# Patient Record
Sex: Male | Born: 1970
Health system: Southern US, Community
[De-identification: ages and names within clinical notes are randomized; demographics above are authoritative.]

## PROBLEM LIST (undated history)

## (undated) DIAGNOSIS — F419 Anxiety disorder, unspecified: Secondary | ICD-10-CM

## (undated) DIAGNOSIS — S065XAA Traumatic subdural hemorrhage with loss of consciousness status unknown, initial encounter: Secondary | ICD-10-CM

## (undated) DIAGNOSIS — K219 Gastro-esophageal reflux disease without esophagitis: Secondary | ICD-10-CM

## (undated) DIAGNOSIS — S065X9A Traumatic subdural hemorrhage with loss of consciousness of unspecified duration, initial encounter: Secondary | ICD-10-CM

## (undated) DIAGNOSIS — S0990XA Unspecified injury of head, initial encounter: Secondary | ICD-10-CM

## (undated) DIAGNOSIS — R918 Other nonspecific abnormal finding of lung field: Secondary | ICD-10-CM

## (undated) DIAGNOSIS — R519 Headache, unspecified: Secondary | ICD-10-CM

## (undated) DIAGNOSIS — J439 Emphysema, unspecified: Secondary | ICD-10-CM

## (undated) DIAGNOSIS — I10 Essential (primary) hypertension: Secondary | ICD-10-CM

## (undated) DIAGNOSIS — F329 Major depressive disorder, single episode, unspecified: Secondary | ICD-10-CM

## (undated) DIAGNOSIS — R569 Unspecified convulsions: Secondary | ICD-10-CM

## (undated) DIAGNOSIS — G4700 Insomnia, unspecified: Secondary | ICD-10-CM

## (undated) DIAGNOSIS — Z789 Other specified health status: Secondary | ICD-10-CM

## (undated) DIAGNOSIS — F32A Depression, unspecified: Secondary | ICD-10-CM

## (undated) DIAGNOSIS — F0390 Unspecified dementia without behavioral disturbance: Secondary | ICD-10-CM

## (undated) DIAGNOSIS — G8929 Other chronic pain: Secondary | ICD-10-CM

## (undated) DIAGNOSIS — IMO0001 Reserved for inherently not codable concepts without codable children: Secondary | ICD-10-CM

## (undated) HISTORY — DX: Insomnia, unspecified: G47.00

## (undated) HISTORY — DX: Unspecified convulsions: R56.9

## (undated) HISTORY — PX: CRANIOPLASTY: SUR330

## (undated) HISTORY — PX: BRAIN SURGERY: SHX531

## (undated) HISTORY — PX: CRANIOTOMY: SHX93

## (undated) HISTORY — DX: Anxiety disorder, unspecified: F41.9

## (undated) HISTORY — PX: ABDOMINAL SURGERY: SHX537

---

## 2004-02-22 ENCOUNTER — Emergency Department: Payer: Self-pay | Admitting: Emergency Medicine

## 2004-03-04 ENCOUNTER — Emergency Department: Payer: Self-pay | Admitting: Emergency Medicine

## 2005-12-16 ENCOUNTER — Emergency Department (HOSPITAL_COMMUNITY): Admission: EM | Admit: 2005-12-16 | Discharge: 2005-12-16 | Payer: Self-pay | Admitting: Emergency Medicine

## 2006-06-11 ENCOUNTER — Emergency Department (HOSPITAL_COMMUNITY): Admission: EM | Admit: 2006-06-11 | Discharge: 2006-06-11 | Payer: Self-pay | Admitting: Emergency Medicine

## 2006-06-12 ENCOUNTER — Ambulatory Visit: Payer: Self-pay | Admitting: Orthopedic Surgery

## 2007-07-13 ENCOUNTER — Emergency Department (HOSPITAL_COMMUNITY): Admission: EM | Admit: 2007-07-13 | Discharge: 2007-07-13 | Payer: Self-pay | Admitting: Emergency Medicine

## 2007-11-23 ENCOUNTER — Emergency Department (HOSPITAL_COMMUNITY): Admission: EM | Admit: 2007-11-23 | Discharge: 2007-11-23 | Payer: Self-pay | Admitting: Emergency Medicine

## 2008-04-06 ENCOUNTER — Emergency Department (HOSPITAL_COMMUNITY): Admission: EM | Admit: 2008-04-06 | Discharge: 2008-04-06 | Payer: Self-pay | Admitting: Emergency Medicine

## 2010-03-24 ENCOUNTER — Inpatient Hospital Stay (HOSPITAL_COMMUNITY)
Admission: EM | Admit: 2010-03-24 | Discharge: 2010-04-12 | Disposition: A | Discharge status: Rehabilitation Facility | Payer: Self-pay | Source: Home / Self Care

## 2010-03-29 LAB — COMPREHENSIVE METABOLIC PANEL
ALT: 13 U/L (ref 0–53)
ALT: 21 U/L (ref 0–53)
ALT: 13 U/L (ref 0–53)
AST: 25 U/L (ref 0–37)
AST: 38 U/L — ABNORMAL HIGH (ref 0–37)
AST: 20 U/L (ref 0–37)
Albumin: 2.7 g/dL — ABNORMAL LOW (ref 3.5–5.2)
Albumin: 3.7 g/dL (ref 3.5–5.2)
Albumin: 2.1 g/dL — ABNORMAL LOW (ref 3.5–5.2)
Alkaline Phosphatase: 44 U/L (ref 39–117)
Alkaline Phosphatase: 62 U/L (ref 39–117)
Alkaline Phosphatase: 56 U/L (ref 39–117)
BUN: 7 mg/dL (ref 6–23)
BUN: 7 mg/dL (ref 6–23)
BUN: 11 mg/dL (ref 6–23)
CO2: 23 mEq/L (ref 19–32)
CO2: 29 mEq/L (ref 19–32)
CO2: 27 mEq/L (ref 19–32)
Calcium: 7.9 mg/dL — ABNORMAL LOW (ref 8.4–10.5)
Calcium: 9 mg/dL (ref 8.4–10.5)
Calcium: 8.1 mg/dL — ABNORMAL LOW (ref 8.4–10.5)
Chloride: 101 mEq/L (ref 96–112)
Chloride: 103 mEq/L (ref 96–112)
Chloride: 103 mEq/L (ref 96–112)
Creatinine, Ser: 0.74 mg/dL (ref 0.4–1.5)
Creatinine, Ser: 0.99 mg/dL (ref 0.4–1.5)
Creatinine, Ser: 0.73 mg/dL (ref 0.4–1.5)
GFR calc Af Amer: >60 mL/min (ref >60)
GFR calc Af Amer: >60 mL/min (ref >60)
GFR calc Af Amer: >60 mL/min (ref >60)
GFR calc non Af Amer: >60 mL/min (ref >60)
GFR calc non Af Amer: >60 mL/min (ref >60)
GFR calc non Af Amer: >60 mL/min (ref >60)
Glucose, Bld: 138 mg/dL — ABNORMAL HIGH (ref 70–99)
Glucose, Bld: 150 mg/dL — ABNORMAL HIGH (ref 70–99)
Glucose, Bld: 119 mg/dL — ABNORMAL HIGH (ref 70–99)
Potassium: 3.4 mEq/L — ABNORMAL LOW (ref 3.5–5.1)
Potassium: 3.7 mEq/L (ref 3.5–5.1)
Potassium: 3.9 mEq/L (ref 3.5–5.1)
Sodium: 132 mEq/L — ABNORMAL LOW (ref 135–145)
Sodium: 140 mEq/L (ref 135–145)
Sodium: 137 mEq/L (ref 135–145)
Total Bilirubin: 0.4 mg/dL (ref 0.3–1.2)
Total Bilirubin: 0.7 mg/dL (ref 0.3–1.2)
Total Bilirubin: 0.6 mg/dL (ref 0.3–1.2)
Total Protein: 5.2 g/dL — ABNORMAL LOW (ref 6.0–8.3)
Total Protein: 6.9 g/dL (ref 6.0–8.3)
Total Protein: 5.1 g/dL — ABNORMAL LOW (ref 6.0–8.3)

## 2010-03-29 LAB — HEMOGLOBIN A1C
Hgb A1c MFr Bld: 6 % — ABNORMAL HIGH (ref <5.7)
Mean Plasma Glucose: 126 mg/dL — ABNORMAL HIGH (ref <117)

## 2010-03-29 LAB — BLOOD GAS, ARTERIAL
Acid-Base Excess: 1 mmol/L (ref 0.0–2.0)
Acid-Base Excess: 0.2 mmol/L (ref 0.0–2.0)
Acid-Base Excess: 1.6 mmol/L (ref 0.0–2.0)
Acid-Base Excess: 2.4 mmol/L — ABNORMAL HIGH (ref 0.0–2.0)
Acid-Base Excess: 2.2 mmol/L — ABNORMAL HIGH (ref 0.0–2.0)
Bicarbonate: 24.8 mEq/L — ABNORMAL HIGH (ref 20.0–24.0)
Bicarbonate: 24.3 mEq/L — ABNORMAL HIGH (ref 20.0–24.0)
Bicarbonate: 26.3 mEq/L — ABNORMAL HIGH (ref 20.0–24.0)
Bicarbonate: 26.2 mEq/L — ABNORMAL HIGH (ref 20.0–24.0)
Bicarbonate: 26.2 mEq/L — ABNORMAL HIGH (ref 20.0–24.0)
Drawn by: 249101
Drawn by: 330991
Drawn by: 330991
Drawn by: 308061
Drawn by: 244901
FIO2: 0.7 %
FIO2: 0.7 %
FIO2: 0.4 %
FIO2: 0.4 %
FIO2: 0.4 %
MECHVT: 750 mL
MECHVT: 750 mL
MECHVT: 750 mL
MECHVT: 750 mL
MECHVT: 750 mL
O2 Saturation: 95.5 %
O2 Saturation: 93.5 %
O2 Saturation: 87.8 %
O2 Saturation: 98.8 %
O2 Saturation: 97.1 %
PEEP: 5 cmH2O
PEEP: 5 cmH2O
PEEP: 10 cmH2O
PEEP: 10 cmH2O
PEEP: 10 cmH2O
Patient temperature: 100.4
Patient temperature: 100.4
Patient temperature: 98.6
Patient temperature: 98.6
Patient temperature: 100.9
RATE: 12 resp/min
RATE: 12 resp/min
RATE: 12 resp/min
RATE: 14 resp/min
RATE: 14 resp/min
TCO2: 25.9 mmol/L (ref 0–100)
TCO2: 25.5 mmol/L (ref 0–100)
TCO2: 27.7 mmol/L (ref 0–100)
TCO2: 27.5 mmol/L (ref 0–100)
TCO2: 27.4 mmol/L (ref 0–100)
pCO2 arterial: 39.2 mmHg (ref 35.0–45.0)
pCO2 arterial: 41.3 mmHg (ref 35.0–45.0)
pCO2 arterial: 46.5 mmHg — ABNORMAL HIGH (ref 35.0–45.0)
pCO2 arterial: 39.6 mmHg (ref 35.0–45.0)
pCO2 arterial: 42.8 mmHg (ref 35.0–45.0)
pH, Arterial: 7.422 (ref 7.350–7.450)
pH, Arterial: 7.392 (ref 7.350–7.450)
pH, Arterial: 7.371 (ref 7.350–7.450)
pH, Arterial: 7.436 (ref 7.350–7.450)
pH, Arterial: 7.41 (ref 7.350–7.450)
pO2, Arterial: 84.3 mmHg (ref 80.0–100.0)
pO2, Arterial: 71.9 mmHg — ABNORMAL LOW (ref 80.0–100.0)
pO2, Arterial: 56.5 mmHg — ABNORMAL LOW (ref 80.0–100.0)
pO2, Arterial: 116 mmHg — ABNORMAL HIGH (ref 80.0–100.0)
pO2, Arterial: 97.4 mmHg (ref 80.0–100.0)

## 2010-03-29 LAB — BASIC METABOLIC PANEL
BUN: 5 mg/dL — ABNORMAL LOW (ref 6–23)
BUN: 12 mg/dL (ref 6–23)
BUN: 8 mg/dL (ref 6–23)
CO2: 25 mEq/L (ref 19–32)
CO2: 24 mEq/L (ref 19–32)
CO2: 26 mEq/L (ref 19–32)
Calcium: 8 mg/dL — ABNORMAL LOW (ref 8.4–10.5)
Calcium: 7.4 mg/dL — ABNORMAL LOW (ref 8.4–10.5)
Calcium: 7.9 mg/dL — ABNORMAL LOW (ref 8.4–10.5)
Chloride: 103 mEq/L (ref 96–112)
Chloride: 109 mEq/L (ref 96–112)
Chloride: 103 mEq/L (ref 96–112)
Creatinine, Ser: 0.86 mg/dL (ref 0.4–1.5)
Creatinine, Ser: 0.75 mg/dL (ref 0.4–1.5)
Creatinine, Ser: 0.73 mg/dL (ref 0.4–1.5)
GFR calc Af Amer: >60 mL/min (ref >60)
GFR calc Af Amer: >60 mL/min (ref >60)
GFR calc Af Amer: >60 mL/min (ref >60)
GFR calc non Af Amer: >60 mL/min (ref >60)
GFR calc non Af Amer: >60 mL/min (ref >60)
GFR calc non Af Amer: >60 mL/min (ref >60)
Glucose, Bld: 132 mg/dL — ABNORMAL HIGH (ref 70–99)
Glucose, Bld: 125 mg/dL — ABNORMAL HIGH (ref 70–99)
Glucose, Bld: 103 mg/dL — ABNORMAL HIGH (ref 70–99)
Potassium: 3.3 mEq/L — ABNORMAL LOW (ref 3.5–5.1)
Potassium: 5.5 mEq/L — ABNORMAL HIGH (ref 3.5–5.1)
Potassium: 3.7 mEq/L (ref 3.5–5.1)
Sodium: 138 mEq/L (ref 135–145)
Sodium: 138 mEq/L (ref 135–145)
Sodium: 135 mEq/L (ref 135–145)

## 2010-03-29 LAB — GLUCOSE, CAPILLARY
Glucose-Capillary: 126 mg/dL — ABNORMAL HIGH (ref 70–99)
Glucose-Capillary: 127 mg/dL — ABNORMAL HIGH (ref 70–99)
Glucose-Capillary: 129 mg/dL — ABNORMAL HIGH (ref 70–99)
Glucose-Capillary: 130 mg/dL — ABNORMAL HIGH (ref 70–99)
Glucose-Capillary: 130 mg/dL — ABNORMAL HIGH (ref 70–99)
Glucose-Capillary: 137 mg/dL — ABNORMAL HIGH (ref 70–99)
Glucose-Capillary: 139 mg/dL — ABNORMAL HIGH (ref 70–99)
Glucose-Capillary: 139 mg/dL — ABNORMAL HIGH (ref 70–99)
Glucose-Capillary: 143 mg/dL — ABNORMAL HIGH (ref 70–99)
Glucose-Capillary: 168 mg/dL — ABNORMAL HIGH (ref 70–99)
Glucose-Capillary: 105 mg/dL — ABNORMAL HIGH (ref 70–99)
Glucose-Capillary: 106 mg/dL — ABNORMAL HIGH (ref 70–99)
Glucose-Capillary: 108 mg/dL — ABNORMAL HIGH (ref 70–99)
Glucose-Capillary: 112 mg/dL — ABNORMAL HIGH (ref 70–99)
Glucose-Capillary: 113 mg/dL — ABNORMAL HIGH (ref 70–99)
Glucose-Capillary: 114 mg/dL — ABNORMAL HIGH (ref 70–99)
Glucose-Capillary: 115 mg/dL — ABNORMAL HIGH (ref 70–99)
Glucose-Capillary: 118 mg/dL — ABNORMAL HIGH (ref 70–99)
Glucose-Capillary: 122 mg/dL — ABNORMAL HIGH (ref 70–99)
Glucose-Capillary: 127 mg/dL — ABNORMAL HIGH (ref 70–99)
Glucose-Capillary: 104 mg/dL — ABNORMAL HIGH (ref 70–99)
Glucose-Capillary: 105 mg/dL — ABNORMAL HIGH (ref 70–99)
Glucose-Capillary: 114 mg/dL — ABNORMAL HIGH (ref 70–99)
Glucose-Capillary: 123 mg/dL — ABNORMAL HIGH (ref 70–99)
Glucose-Capillary: 96 mg/dL (ref 70–99)
Glucose-Capillary: 99 mg/dL (ref 70–99)

## 2010-03-29 LAB — CBC
HCT: 28.6 % — ABNORMAL LOW (ref 39.0–52.0)
HCT: 31.3 % — ABNORMAL LOW (ref 39.0–52.0)
HCT: 37.5 % — ABNORMAL LOW (ref 39.0–52.0)
HCT: 20.6 % — ABNORMAL LOW (ref 39.0–52.0)
HCT: 22.3 % — ABNORMAL LOW (ref 39.0–52.0)
HCT: 24 % — ABNORMAL LOW (ref 39.0–52.0)
HCT: 20.2 % — ABNORMAL LOW (ref 39.0–52.0)
Hemoglobin: 10.4 g/dL — ABNORMAL LOW (ref 13.0–17.0)
Hemoglobin: 12.3 g/dL — ABNORMAL LOW (ref 13.0–17.0)
Hemoglobin: 9.6 g/dL — ABNORMAL LOW (ref 13.0–17.0)
Hemoglobin: 6.9 g/dL — CL (ref 13.0–17.0)
Hemoglobin: 7.3 g/dL — ABNORMAL LOW (ref 13.0–17.0)
Hemoglobin: 7.9 g/dL — ABNORMAL LOW (ref 13.0–17.0)
Hemoglobin: 6.7 g/dL — CL (ref 13.0–17.0)
MCH: 28.2 pg (ref 26.0–34.0)
MCH: 28.3 pg (ref 26.0–34.0)
MCH: 28.4 pg (ref 26.0–34.0)
MCH: 28.3 pg (ref 26.0–34.0)
MCH: 28.3 pg (ref 26.0–34.0)
MCH: 28.6 pg (ref 26.0–34.0)
MCH: 28.2 pg (ref 26.0–34.0)
MCHC: 32.8 g/dL (ref 30.0–36.0)
MCHC: 33.2 g/dL (ref 30.0–36.0)
MCHC: 33.6 g/dL (ref 30.0–36.0)
MCHC: 32.7 g/dL (ref 30.0–36.0)
MCHC: 32.9 g/dL (ref 30.0–36.0)
MCHC: 33.5 g/dL (ref 30.0–36.0)
MCHC: 33.2 g/dL (ref 30.0–36.0)
MCV: 84.1 fL (ref 78.0–100.0)
MCV: 85.1 fL (ref 78.0–100.0)
MCV: 86.6 fL (ref 78.0–100.0)
MCV: 85.5 fL (ref 78.0–100.0)
MCV: 86 fL (ref 78.0–100.0)
MCV: 86.4 fL (ref 78.0–100.0)
MCV: 84.9 fL (ref 78.0–100.0)
Platelets: 198 10*3/uL (ref 150–400)
Platelets: 207 10*3/uL (ref 150–400)
Platelets: 280 10*3/uL (ref 150–400)
Platelets: 146 10*3/uL — ABNORMAL LOW (ref 150–400)
Platelets: 151 10*3/uL (ref 150–400)
Platelets: 165 10*3/uL (ref 150–400)
Platelets: 194 10*3/uL (ref 150–400)
RBC: 3.4 MIL/uL — ABNORMAL LOW (ref 4.22–5.81)
RBC: 3.68 MIL/uL — ABNORMAL LOW (ref 4.22–5.81)
RBC: 4.33 MIL/uL (ref 4.22–5.81)
RBC: 2.41 MIL/uL — ABNORMAL LOW (ref 4.22–5.81)
RBC: 2.58 MIL/uL — ABNORMAL LOW (ref 4.22–5.81)
RBC: 2.79 MIL/uL — ABNORMAL LOW (ref 4.22–5.81)
RBC: 2.38 MIL/uL — ABNORMAL LOW (ref 4.22–5.81)
RDW: 13.5 % (ref 11.5–15.5)
RDW: 13.6 % (ref 11.5–15.5)
RDW: 13.6 % (ref 11.5–15.5)
RDW: 13.9 % (ref 11.5–15.5)
RDW: 13.9 % (ref 11.5–15.5)
RDW: 14 % (ref 11.5–15.5)
RDW: 14 % (ref 11.5–15.5)
WBC: 13.5 10*3/uL — ABNORMAL HIGH (ref 4.0–10.5)
WBC: 15.8 10*3/uL — ABNORMAL HIGH (ref 4.0–10.5)
WBC: 16.1 10*3/uL — ABNORMAL HIGH (ref 4.0–10.5)
WBC: 10.3 10*3/uL (ref 4.0–10.5)
WBC: 11.2 10*3/uL — ABNORMAL HIGH (ref 4.0–10.5)
WBC: 11.7 10*3/uL — ABNORMAL HIGH (ref 4.0–10.5)
WBC: 10.2 10*3/uL (ref 4.0–10.5)

## 2010-03-29 LAB — POCT I-STAT 7, (LYTES, BLD GAS, ICA,H+H)
Acid-base deficit: 2 mmol/L (ref 0.0–2.0)
Acid-base deficit: 3 mmol/L — ABNORMAL HIGH (ref 0.0–2.0)
Bicarbonate: 21.9 mEq/L (ref 20.0–24.0)
Bicarbonate: 22.7 mEq/L (ref 20.0–24.0)
Calcium, Ion: 1.05 mmol/L — ABNORMAL LOW (ref 1.12–1.32)
Calcium, Ion: 1.07 mmol/L — ABNORMAL LOW (ref 1.12–1.32)
HCT: 33 % — ABNORMAL LOW (ref 39.0–52.0)
HCT: 32 % — ABNORMAL LOW (ref 39.0–52.0)
Hemoglobin: 11.2 g/dL — ABNORMAL LOW (ref 13.0–17.0)
Hemoglobin: 10.9 g/dL — ABNORMAL LOW (ref 13.0–17.0)
O2 Saturation: 99 %
O2 Saturation: 100 %
Potassium: 3 mEq/L — ABNORMAL LOW (ref 3.5–5.1)
Potassium: 3.1 mEq/L — ABNORMAL LOW (ref 3.5–5.1)
Sodium: 136 mEq/L (ref 135–145)
Sodium: 136 mEq/L (ref 135–145)
TCO2: 23 mmol/L (ref 0–100)
TCO2: 24 mmol/L (ref 0–100)
pCO2 arterial: 34.8 mmHg — ABNORMAL LOW (ref 35.0–45.0)
pCO2 arterial: 40.5 mmHg (ref 35.0–45.0)
pH, Arterial: 7.406 (ref 7.350–7.450)
pH, Arterial: 7.357 (ref 7.350–7.450)
pO2, Arterial: 118 mmHg — ABNORMAL HIGH (ref 80.0–100.0)
pO2, Arterial: 275 mmHg — ABNORMAL HIGH (ref 80.0–100.0)

## 2010-03-29 LAB — NO BLOOD PRODUCTS

## 2010-03-29 LAB — RAPID URINE DRUG SCREEN, HOSP PERFORMED
Amphetamines: NOT DETECTED
Barbiturates: NOT DETECTED
Benzodiazepines: POSITIVE — AB
Cocaine: NOT DETECTED
Opiates: POSITIVE — AB
Tetrahydrocannabinol: POSITIVE — AB

## 2010-03-29 LAB — URINE CULTURE
Colony Count: NO GROWTH
Culture  Setup Time: 201201121459
Culture: NO GROWTH
Special Requests: NEGATIVE

## 2010-03-29 LAB — PROTIME-INR
INR: 0.95 (ref 0.00–1.49)
INR: 1.19 (ref 0.00–1.49)
INR: 1.09 (ref 0.00–1.49)
INR: 1.35 (ref 0.00–1.49)
Prothrombin Time: 12.9 seconds (ref 11.6–15.2)
Prothrombin Time: 15.3 seconds — ABNORMAL HIGH (ref 11.6–15.2)
Prothrombin Time: 14.3 seconds (ref 11.6–15.2)
Prothrombin Time: 16.9 seconds — ABNORMAL HIGH (ref 11.6–15.2)

## 2010-03-29 LAB — POCT I-STAT, CHEM 8
BUN: 8 mg/dL (ref 6–23)
Calcium, Ion: 1.06 mmol/L — ABNORMAL LOW (ref 1.12–1.32)
Chloride: 101 mEq/L (ref 96–112)
Creatinine, Ser: 1.1 mg/dL (ref 0.4–1.5)
Glucose, Bld: 145 mg/dL — ABNORMAL HIGH (ref 70–99)
HCT: 38 % — ABNORMAL LOW (ref 39.0–52.0)
Hemoglobin: 12.9 g/dL — ABNORMAL LOW (ref 13.0–17.0)
Potassium: 3.6 mEq/L (ref 3.5–5.1)
Sodium: 139 mEq/L (ref 135–145)
TCO2: 28 mmol/L (ref 0–100)

## 2010-03-29 LAB — URINALYSIS, ROUTINE W REFLEX MICROSCOPIC
Bilirubin Urine: NEGATIVE
Bilirubin Urine: NEGATIVE
Ketones, ur: NEGATIVE mg/dL
Ketones, ur: NEGATIVE mg/dL
Leukocytes, UA: NEGATIVE
Leukocytes, UA: NEGATIVE
Nitrite: NEGATIVE
Nitrite: NEGATIVE
Protein, ur: NEGATIVE mg/dL
Protein, ur: NEGATIVE mg/dL
Specific Gravity, Urine: 1.021 (ref 1.005–1.030)
Specific Gravity, Urine: 1.029 (ref 1.005–1.030)
Urine Glucose, Fasting: NEGATIVE mg/dL
Urine Glucose, Fasting: NEGATIVE mg/dL
Urobilinogen, UA: 0.2 mg/dL (ref 0.0–1.0)
Urobilinogen, UA: 0.2 mg/dL (ref 0.0–1.0)
pH: 5.5 (ref 5.0–8.0)
pH: 5.5 (ref 5.0–8.0)

## 2010-03-29 LAB — DIFFERENTIAL
Basophils Absolute: 0 10*3/uL (ref 0.0–0.1)
Basophils Relative: 0 % (ref 0–1)
Eosinophils Absolute: 0.3 10*3/uL (ref 0.0–0.7)
Eosinophils Relative: 3 % (ref 0–5)
Lymphocytes Relative: 9 % — ABNORMAL LOW (ref 12–46)
Lymphs Abs: 1 10*3/uL (ref 0.7–4.0)
Monocytes Absolute: 0.9 10*3/uL (ref 0.1–1.0)
Monocytes Relative: 8 % (ref 3–12)
Neutro Abs: 9 10*3/uL — ABNORMAL HIGH (ref 1.7–7.7)
Neutrophils Relative %: 81 % — ABNORMAL HIGH (ref 43–77)

## 2010-03-29 LAB — CK TOTAL AND CKMB (NOT AT ARMC)
CK, MB: 1.6 ng/mL (ref 0.3–4.0)
Relative Index: 0.8 (ref 0.0–2.5)
Total CK: 204 U/L (ref 7–232)

## 2010-03-29 LAB — MAGNESIUM
Magnesium: 2.1 mg/dL (ref 1.5–2.5)
Magnesium: 2 mg/dL (ref 1.5–2.5)

## 2010-03-29 LAB — CROSSMATCH
ABO/RH(D): A POS
Antibody Screen: NEGATIVE
Unit division: 0
Unit division: 0

## 2010-03-29 LAB — TRIGLYCERIDES: Triglycerides: 160 mg/dL — ABNORMAL HIGH (ref <150)

## 2010-03-29 LAB — SAMPLE TO BLOOD BANK

## 2010-03-29 LAB — URINE MICROSCOPIC-ADD ON

## 2010-03-29 LAB — RETICULOCYTES
RBC.: 2.57 MIL/uL — ABNORMAL LOW (ref 4.22–5.81)
Retic Count, Absolute: 23.1 10*3/uL (ref 19.0–186.0)
Retic Ct Pct: 0.9 % (ref 0.4–3.1)

## 2010-03-29 LAB — CULTURE, BAL-QUANTITATIVE W GRAM STAIN: Colony Count: >=100000

## 2010-03-29 LAB — FOLATE: Folate: 4.6 ng/mL

## 2010-03-29 LAB — PREALBUMIN: Prealbumin: 10.4 mg/dL — ABNORMAL LOW (ref 17.0–34.0)

## 2010-03-29 LAB — PHOSPHORUS
Phosphorus: 1.7 mg/dL — ABNORMAL LOW (ref 2.3–4.6)
Phosphorus: 3.1 mg/dL (ref 2.3–4.6)

## 2010-03-29 LAB — VITAMIN B12: Vitamin B-12: 464 pg/mL (ref 211–911)

## 2010-03-29 LAB — FERRITIN: Ferritin: 105 ng/mL (ref 22–322)

## 2010-03-29 LAB — IRON AND TIBC
Iron: 11 ug/dL — ABNORMAL LOW (ref 42–135)
Saturation Ratios: 6 % — ABNORMAL LOW (ref 20–55)
TIBC: 173 ug/dL — ABNORMAL LOW (ref 215–435)
UIBC: 162 ug/dL

## 2010-03-29 LAB — GLUCOSE, POCT (MANUAL RESULT ENTRY)
Glucose, Bld: 155 mg/dL — ABNORMAL HIGH (ref 70–99)
Operator id: 117071

## 2010-03-29 LAB — LACTIC ACID, PLASMA: Lactic Acid, Venous: 3.7 mmol/L — ABNORMAL HIGH (ref 0.5–2.2)

## 2010-03-29 LAB — CHOLESTEROL, TOTAL: Cholesterol: 115 mg/dL (ref 0–200)

## 2010-03-29 LAB — ETHANOL: Alcohol, Ethyl (B): <5 mg/dL (ref 0–10)

## 2010-03-29 LAB — TROPONIN I: Troponin I: 0.01 ng/mL (ref 0.00–0.06)

## 2010-03-29 LAB — ABO/RH: ABO/RH(D): A POS

## 2010-03-31 LAB — COMPREHENSIVE METABOLIC PANEL
ALT: 56 U/L — ABNORMAL HIGH (ref 0–53)
AST: 63 U/L — ABNORMAL HIGH (ref 0–37)
Albumin: 1.8 g/dL — ABNORMAL LOW (ref 3.5–5.2)
Alkaline Phosphatase: 208 U/L — ABNORMAL HIGH (ref 39–117)
BUN: 11 mg/dL (ref 6–23)
CO2: 27 mEq/L (ref 19–32)
Calcium: 7.8 mg/dL — ABNORMAL LOW (ref 8.4–10.5)
Chloride: 100 mEq/L (ref 96–112)
Creatinine, Ser: 0.59 mg/dL (ref 0.4–1.5)
GFR calc Af Amer: >60 mL/min (ref >60)
GFR calc non Af Amer: >60 mL/min (ref >60)
Glucose, Bld: 123 mg/dL — ABNORMAL HIGH (ref 70–99)
Potassium: 3.4 mEq/L — ABNORMAL LOW (ref 3.5–5.1)
Sodium: 133 mEq/L — ABNORMAL LOW (ref 135–145)
Total Bilirubin: 0.4 mg/dL (ref 0.3–1.2)
Total Protein: 4.9 g/dL — ABNORMAL LOW (ref 6.0–8.3)

## 2010-03-31 LAB — BASIC METABOLIC PANEL
BUN: 12 mg/dL (ref 6–23)
CO2: 26 mEq/L (ref 19–32)
Calcium: 8.4 mg/dL (ref 8.4–10.5)
Chloride: 98 mEq/L (ref 96–112)
Creatinine, Ser: 0.54 mg/dL (ref 0.4–1.5)
GFR calc Af Amer: >60 mL/min (ref >60)
GFR calc non Af Amer: >60 mL/min (ref >60)
Glucose, Bld: 147 mg/dL — ABNORMAL HIGH (ref 70–99)
Potassium: 3.7 mEq/L (ref 3.5–5.1)
Sodium: 132 mEq/L — ABNORMAL LOW (ref 135–145)

## 2010-03-31 LAB — CBC
HCT: 18.7 % — ABNORMAL LOW (ref 39.0–52.0)
HCT: 20.5 % — ABNORMAL LOW (ref 39.0–52.0)
Hemoglobin: 6.3 g/dL — CL (ref 13.0–17.0)
Hemoglobin: 6.2 g/dL — CL (ref 13.0–17.0)
MCH: 28.5 pg (ref 26.0–34.0)
MCH: 28.4 pg (ref 26.0–34.0)
MCHC: 33.7 g/dL (ref 30.0–36.0)
MCHC: 30.2 g/dL (ref 30.0–36.0)
MCV: 84.6 fL (ref 78.0–100.0)
MCV: 94 fL (ref 78.0–100.0)
Platelets: 217 10*3/uL (ref 150–400)
Platelets: 242 10*3/uL (ref 150–400)
RBC: 2.21 MIL/uL — ABNORMAL LOW (ref 4.22–5.81)
RBC: 2.18 MIL/uL — ABNORMAL LOW (ref 4.22–5.81)
RDW: 14.3 % (ref 11.5–15.5)
RDW: 14.9 % (ref 11.5–15.5)
WBC: 8.9 10*3/uL (ref 4.0–10.5)
WBC: 8.8 10*3/uL (ref 4.0–10.5)

## 2010-03-31 LAB — PREALBUMIN: Prealbumin: 10.7 mg/dL — ABNORMAL LOW (ref 17.0–34.0)

## 2010-03-31 LAB — TRIGLYCERIDES: Triglycerides: 134 mg/dL (ref <150)

## 2010-03-31 LAB — GLUCOSE, CAPILLARY
Glucose-Capillary: 110 mg/dL — ABNORMAL HIGH (ref 70–99)
Glucose-Capillary: 114 mg/dL — ABNORMAL HIGH (ref 70–99)
Glucose-Capillary: 118 mg/dL — ABNORMAL HIGH (ref 70–99)
Glucose-Capillary: 120 mg/dL — ABNORMAL HIGH (ref 70–99)
Glucose-Capillary: 126 mg/dL — ABNORMAL HIGH (ref 70–99)
Glucose-Capillary: 133 mg/dL — ABNORMAL HIGH (ref 70–99)
Glucose-Capillary: 111 mg/dL — ABNORMAL HIGH (ref 70–99)
Glucose-Capillary: 118 mg/dL — ABNORMAL HIGH (ref 70–99)
Glucose-Capillary: 124 mg/dL — ABNORMAL HIGH (ref 70–99)
Glucose-Capillary: 133 mg/dL — ABNORMAL HIGH (ref 70–99)
Glucose-Capillary: 143 mg/dL — ABNORMAL HIGH (ref 70–99)

## 2010-03-31 LAB — VANCOMYCIN, TROUGH: Vancomycin Tr: 8.1 ug/mL — ABNORMAL LOW (ref 10.0–20.0)

## 2010-03-31 LAB — CHOLESTEROL, TOTAL: Cholesterol: 131 mg/dL (ref 0–200)

## 2010-03-31 LAB — DIFFERENTIAL
Basophils Absolute: 0.1 10*3/uL (ref 0.0–0.1)
Basophils Relative: 1 % (ref 0–1)
Eosinophils Absolute: 0.4 10*3/uL (ref 0.0–0.7)
Eosinophils Relative: 5 % (ref 0–5)
Lymphocytes Relative: 12 % (ref 12–46)
Lymphs Abs: 1.1 10*3/uL (ref 0.7–4.0)
Monocytes Absolute: 0.9 10*3/uL (ref 0.1–1.0)
Monocytes Relative: 10 % (ref 3–12)
Neutro Abs: 6.4 10*3/uL (ref 1.7–7.7)
Neutrophils Relative %: 72 % (ref 43–77)

## 2010-03-31 LAB — PHOSPHORUS: Phosphorus: 3.1 mg/dL (ref 2.3–4.6)

## 2010-03-31 LAB — MAGNESIUM: Magnesium: 2 mg/dL (ref 1.5–2.5)

## 2010-04-05 LAB — BASIC METABOLIC PANEL
BUN: UNDETERMINED mg/dL (ref 6–23)
BUN: 14 mg/dL (ref 6–23)
BUN: 13 mg/dL (ref 6–23)
CO2: UNDETERMINED mEq/L (ref 19–32)
CO2: 25 mEq/L (ref 19–32)
CO2: 28 mEq/L (ref 19–32)
Calcium: UNDETERMINED mg/dL (ref 8.4–10.5)
Calcium: 7.8 mg/dL — ABNORMAL LOW (ref 8.4–10.5)
Chloride: UNDETERMINED mEq/L (ref 96–112)
Chloride: 96 mEq/L (ref 96–112)
Creatinine, Ser: UNDETERMINED mg/dL (ref 0.4–1.5)
Creatinine, Ser: 0.63 mg/dL (ref 0.4–1.5)
GFR calc Af Amer: UNDETERMINED mL/min (ref >60)
GFR calc Af Amer: >60 mL/min (ref >60)
GFR calc non Af Amer: UNDETERMINED mL/min (ref >60)
GFR calc non Af Amer: >60 mL/min (ref >60)
GFR calc non Af Amer: >60 mL/min (ref >60)
Glucose, Bld: UNDETERMINED mg/dL (ref 70–99)
Glucose, Bld: 110 mg/dL — ABNORMAL HIGH (ref 70–99)
Glucose, Bld: 115 mg/dL — ABNORMAL HIGH (ref 70–99)
Potassium: UNDETERMINED mEq/L (ref 3.5–5.1)
Potassium: 3 mEq/L — ABNORMAL LOW (ref 3.5–5.1)
Potassium: 3.5 mEq/L (ref 3.5–5.1)
Sodium: UNDETERMINED mEq/L (ref 135–145)
Sodium: 129 mEq/L — ABNORMAL LOW (ref 135–145)
Sodium: 124 mEq/L — ABNORMAL LOW (ref 135–145)

## 2010-04-05 LAB — CULTURE, BLOOD (ROUTINE X 2)
Culture  Setup Time: 201201122324
Culture  Setup Time: 201201122325
Culture: NO GROWTH
Culture: NO GROWTH

## 2010-04-05 LAB — COMPREHENSIVE METABOLIC PANEL
ALT: 116 U/L — ABNORMAL HIGH (ref 0–53)
AST: 48 U/L — ABNORMAL HIGH (ref 0–37)
Albumin: 2.1 g/dL — ABNORMAL LOW (ref 3.5–5.2)
Alkaline Phosphatase: 279 U/L — ABNORMAL HIGH (ref 39–117)
BUN: 12 mg/dL (ref 6–23)
CO2: 27 mEq/L (ref 19–32)
Calcium: 7.8 mg/dL — ABNORMAL LOW (ref 8.4–10.5)
Chloride: 90 mEq/L — ABNORMAL LOW (ref 96–112)
Creatinine, Ser: 0.57 mg/dL (ref 0.4–1.5)
GFR calc Af Amer: >60 mL/min (ref >60)
GFR calc non Af Amer: >60 mL/min (ref >60)
Glucose, Bld: 118 mg/dL — ABNORMAL HIGH (ref 70–99)
Potassium: 3.4 mEq/L — ABNORMAL LOW (ref 3.5–5.1)
Sodium: 126 mEq/L — ABNORMAL LOW (ref 135–145)
Total Bilirubin: 0.4 mg/dL (ref 0.3–1.2)
Total Protein: 5.4 g/dL — ABNORMAL LOW (ref 6.0–8.3)

## 2010-04-05 LAB — GLUCOSE, CAPILLARY
Glucose-Capillary: 160 mg/dL — ABNORMAL HIGH (ref 70–99)
Glucose-Capillary: 138 mg/dL — ABNORMAL HIGH (ref 70–99)
Glucose-Capillary: 125 mg/dL — ABNORMAL HIGH (ref 70–99)
Glucose-Capillary: 118 mg/dL — ABNORMAL HIGH (ref 70–99)
Glucose-Capillary: 130 mg/dL — ABNORMAL HIGH (ref 70–99)
Glucose-Capillary: 121 mg/dL — ABNORMAL HIGH (ref 70–99)
Glucose-Capillary: 129 mg/dL — ABNORMAL HIGH (ref 70–99)
Glucose-Capillary: 128 mg/dL — ABNORMAL HIGH (ref 70–99)
Glucose-Capillary: 131 mg/dL — ABNORMAL HIGH (ref 70–99)
Glucose-Capillary: 140 mg/dL — ABNORMAL HIGH (ref 70–99)
Glucose-Capillary: 127 mg/dL — ABNORMAL HIGH (ref 70–99)
Glucose-Capillary: 123 mg/dL — ABNORMAL HIGH (ref 70–99)
Glucose-Capillary: 114 mg/dL — ABNORMAL HIGH (ref 70–99)
Glucose-Capillary: 123 mg/dL — ABNORMAL HIGH (ref 70–99)
Glucose-Capillary: 109 mg/dL — ABNORMAL HIGH (ref 70–99)
Glucose-Capillary: 122 mg/dL — ABNORMAL HIGH (ref 70–99)

## 2010-04-05 LAB — CBC
HCT: 18.4 % — ABNORMAL LOW (ref 39.0–52.0)
HCT: 20.9 % — ABNORMAL LOW (ref 39.0–52.0)
Hemoglobin: 6.3 g/dL — CL (ref 13.0–17.0)
MCH: 31 pg (ref 26.0–34.0)
MCH: 27.7 pg (ref 26.0–34.0)
MCHC: 34.2 g/dL (ref 30.0–36.0)
MCHC: 34 g/dL (ref 30.0–36.0)
MCV: 90.6 fL (ref 78.0–100.0)
Platelets: 281 10*3/uL (ref 150–400)
RBC: 2.03 MIL/uL — ABNORMAL LOW (ref 4.22–5.81)
RDW: 14.9 % (ref 11.5–15.5)
RDW: 13.6 % (ref 11.5–15.5)
WBC: 9.5 10*3/uL (ref 4.0–10.5)

## 2010-04-05 LAB — MAGNESIUM: Magnesium: 1.9 mg/dL (ref 1.5–2.5)

## 2010-04-05 LAB — PHOSPHORUS: Phosphorus: 4 mg/dL (ref 2.3–4.6)

## 2010-04-05 LAB — RETICULOCYTES: RBC.: 2.56 MIL/uL — ABNORMAL LOW (ref 4.22–5.81)

## 2010-04-06 LAB — BLOOD GAS, ARTERIAL
Acid-Base Excess: 5.2 mmol/L — ABNORMAL HIGH (ref 0.0–2.0)
Acid-Base Excess: 6.6 mmol/L — ABNORMAL HIGH (ref 0.0–2.0)
Bicarbonate: 29 mEq/L — ABNORMAL HIGH (ref 20.0–24.0)
Bicarbonate: 29.2 mEq/L — ABNORMAL HIGH (ref 20.0–24.0)
Bicarbonate: 30.1 mEq/L — ABNORMAL HIGH (ref 20.0–24.0)
FIO2: 0.5 %
FIO2: 0.4 %
O2 Saturation: 98.8 %
PEEP: 5 cmH2O
Patient temperature: 98.6
TCO2: 30.3 mmol/L (ref 0–100)
TCO2: 30.5 mmol/L (ref 0–100)
TCO2: 31.4 mmol/L (ref 0–100)
pCO2 arterial: 45.1 mmHg — ABNORMAL HIGH (ref 35.0–45.0)
pCO2 arterial: 43 mmHg (ref 35.0–45.0)
pH, Arterial: 7.424 (ref 7.350–7.450)
pH, Arterial: 7.447 (ref 7.350–7.450)
pO2, Arterial: 109 mmHg — ABNORMAL HIGH (ref 80.0–100.0)

## 2010-04-06 LAB — GLUCOSE, CAPILLARY
Glucose-Capillary: 145 mg/dL — ABNORMAL HIGH (ref 70–99)
Glucose-Capillary: 147 mg/dL — ABNORMAL HIGH (ref 70–99)
Glucose-Capillary: 107 mg/dL — ABNORMAL HIGH (ref 70–99)
Glucose-Capillary: 136 mg/dL — ABNORMAL HIGH (ref 70–99)
Glucose-Capillary: 138 mg/dL — ABNORMAL HIGH (ref 70–99)
Glucose-Capillary: 132 mg/dL — ABNORMAL HIGH (ref 70–99)
Glucose-Capillary: 139 mg/dL — ABNORMAL HIGH (ref 70–99)

## 2010-04-06 LAB — CBC
HCT: 24 % — ABNORMAL LOW (ref 39.0–52.0)
HCT: 24.7 % — ABNORMAL LOW (ref 39.0–52.0)
Hemoglobin: 8 g/dL — ABNORMAL LOW (ref 13.0–17.0)
Hemoglobin: 6 g/dL — CL (ref 13.0–17.0)
Hemoglobin: 8.1 g/dL — ABNORMAL LOW (ref 13.0–17.0)
MCH: 31.3 pg (ref 26.0–34.0)
MCH: 31.9 pg (ref 26.0–34.0)
MCH: 27.5 pg (ref 26.0–34.0)
MCH: 26.9 pg (ref 26.0–34.0)
MCHC: 33 g/dL (ref 30.0–36.0)
MCHC: 32.8 g/dL (ref 30.0–36.0)
MCV: 82.8 fL (ref 78.0–100.0)
MCV: 118.2 fL — ABNORMAL HIGH (ref 78.0–100.0)
MCV: 112 fL — ABNORMAL HIGH (ref 78.0–100.0)
MCV: 83.3 fL (ref 78.0–100.0)
MCV: 82.1 fL (ref 78.0–100.0)
Platelets: 430 10*3/uL — ABNORMAL HIGH (ref 150–400)
Platelets: 445 10*3/uL — ABNORMAL HIGH (ref 150–400)
Platelets: 551 10*3/uL — ABNORMAL HIGH (ref 150–400)
RBC: 1.92 MIL/uL — ABNORMAL LOW (ref 4.22–5.81)
RDW: 16.1 % — ABNORMAL HIGH (ref 11.5–15.5)
RDW: 14.3 % (ref 11.5–15.5)
WBC: 20.9 10*3/uL — ABNORMAL HIGH (ref 4.0–10.5)

## 2010-04-06 LAB — BASIC METABOLIC PANEL
BUN: 11 mg/dL (ref 6–23)
CO2: 29 mEq/L (ref 19–32)
CO2: 19 mEq/L (ref 19–32)
CO2: 28 mEq/L (ref 19–32)
Calcium: 8.6 mg/dL (ref 8.4–10.5)
Calcium: 8.3 mg/dL — ABNORMAL LOW (ref 8.4–10.5)
Chloride: 93 mEq/L — ABNORMAL LOW (ref 96–112)
Chloride: 77 mEq/L — ABNORMAL LOW (ref 96–112)
Chloride: 93 mEq/L — ABNORMAL LOW (ref 96–112)
Creatinine, Ser: 0.8 mg/dL (ref 0.4–1.5)
GFR calc Af Amer: >60 mL/min (ref >60)
GFR calc Af Amer: >60 mL/min (ref >60)
Glucose, Bld: 122 mg/dL — ABNORMAL HIGH (ref 70–99)
Potassium: 3.8 mEq/L (ref 3.5–5.1)
Sodium: 132 mEq/L — ABNORMAL LOW (ref 135–145)
Sodium: 132 mEq/L — ABNORMAL LOW (ref 135–145)

## 2010-04-06 LAB — DIFFERENTIAL
Basophils Relative: 0 % (ref 0–1)
Lymphocytes Relative: 11 % — ABNORMAL LOW (ref 12–46)
Lymphs Abs: 2.3 10*3/uL (ref 0.7–4.0)
Monocytes Absolute: 1.5 10*3/uL — ABNORMAL HIGH (ref 0.1–1.0)
Monocytes Relative: 8 % (ref 3–12)
Neutro Abs: 15.9 10*3/uL — ABNORMAL HIGH (ref 1.7–7.7)

## 2010-04-06 LAB — COMPREHENSIVE METABOLIC PANEL
Alkaline Phosphatase: 411 U/L — ABNORMAL HIGH (ref 39–117)
BUN: 16 mg/dL (ref 6–23)
CO2: 27 mEq/L (ref 19–32)
Chloride: 97 mEq/L (ref 96–112)
Creatinine, Ser: 0.6 mg/dL (ref 0.4–1.5)
GFR calc non Af Amer: >60 mL/min (ref >60)
Glucose, Bld: 128 mg/dL — ABNORMAL HIGH (ref 70–99)
Total Bilirubin: 1.4 mg/dL — ABNORMAL HIGH (ref 0.3–1.2)

## 2010-04-06 LAB — CULTURE, RESPIRATORY W GRAM STAIN

## 2010-04-06 LAB — PHOSPHORUS: Phosphorus: 4.4 mg/dL (ref 2.3–4.6)

## 2010-04-06 LAB — TRIGLYCERIDES: Triglycerides: 65 mg/dL (ref <150)

## 2010-04-07 LAB — COMPREHENSIVE METABOLIC PANEL
ALT: 292 U/L — ABNORMAL HIGH (ref 0–53)
ALT: 338 U/L — ABNORMAL HIGH (ref 0–53)
Alkaline Phosphatase: 414 U/L — ABNORMAL HIGH (ref 39–117)
Alkaline Phosphatase: 420 U/L — ABNORMAL HIGH (ref 39–117)
CO2: 25 mEq/L (ref 19–32)
CO2: 23 mEq/L (ref 19–32)
Calcium: 9.2 mg/dL (ref 8.4–10.5)
GFR calc non Af Amer: >60 mL/min (ref >60)
Glucose, Bld: 132 mg/dL — ABNORMAL HIGH (ref 70–99)
Glucose, Bld: 114 mg/dL — ABNORMAL HIGH (ref 70–99)
Potassium: 3.9 mEq/L (ref 3.5–5.1)
Potassium: 4 mEq/L (ref 3.5–5.1)
Sodium: 138 mEq/L (ref 135–145)
Sodium: 134 mEq/L — ABNORMAL LOW (ref 135–145)
Total Protein: 7.5 g/dL (ref 6.0–8.3)

## 2010-04-07 LAB — DIFFERENTIAL
Basophils Absolute: 0.1 10*3/uL (ref 0.0–0.1)
Lymphocytes Relative: 9 % — ABNORMAL LOW (ref 12–46)
Neutro Abs: 21.8 10*3/uL — ABNORMAL HIGH (ref 1.7–7.7)
Neutrophils Relative %: 81 % — ABNORMAL HIGH (ref 43–77)

## 2010-04-07 LAB — CBC
HCT: 28.9 % — ABNORMAL LOW (ref 39.0–52.0)
Hemoglobin: 9.4 g/dL — ABNORMAL LOW (ref 13.0–17.0)
MCHC: 32.8 g/dL (ref 30.0–36.0)
RBC: 3.24 MIL/uL — ABNORMAL LOW (ref 4.22–5.81)
RBC: 3.47 MIL/uL — ABNORMAL LOW (ref 4.22–5.81)
RDW: 14.7 % (ref 11.5–15.5)
WBC: 22.2 10*3/uL — ABNORMAL HIGH (ref 4.0–10.5)
WBC: 26.8 10*3/uL — ABNORMAL HIGH (ref 4.0–10.5)

## 2010-04-07 LAB — MRSA PCR SCREENING: MRSA by PCR: POSITIVE — AB

## 2010-04-07 LAB — GLUCOSE, CAPILLARY
Glucose-Capillary: 131 mg/dL — ABNORMAL HIGH (ref 70–99)
Glucose-Capillary: 137 mg/dL — ABNORMAL HIGH (ref 70–99)
Glucose-Capillary: 142 mg/dL — ABNORMAL HIGH (ref 70–99)
Glucose-Capillary: 131 mg/dL — ABNORMAL HIGH (ref 70–99)

## 2010-04-08 LAB — COMPREHENSIVE METABOLIC PANEL
ALT: 424 U/L — ABNORMAL HIGH (ref 0–53)
AST: 150 U/L — ABNORMAL HIGH (ref 0–37)
Albumin: 2.7 g/dL — ABNORMAL LOW (ref 3.5–5.2)
Alkaline Phosphatase: 384 U/L — ABNORMAL HIGH (ref 39–117)
GFR calc Af Amer: >60 mL/min (ref >60)
Glucose, Bld: 105 mg/dL — ABNORMAL HIGH (ref 70–99)
Potassium: 3.8 mEq/L (ref 3.5–5.1)
Sodium: 133 mEq/L — ABNORMAL LOW (ref 135–145)
Total Protein: 7.1 g/dL (ref 6.0–8.3)

## 2010-04-08 LAB — AMYLASE: Amylase: 95 U/L (ref 0–105)

## 2010-04-08 LAB — CBC
MCV: 82 fL (ref 78.0–100.0)
Platelets: 744 10*3/uL — ABNORMAL HIGH (ref 150–400)
RBC: 3.33 MIL/uL — ABNORMAL LOW (ref 4.22–5.81)
WBC: 27.4 10*3/uL — ABNORMAL HIGH (ref 4.0–10.5)

## 2010-04-09 LAB — COMPREHENSIVE METABOLIC PANEL
AST: 158 U/L — ABNORMAL HIGH (ref 0–37)
BUN: 9 mg/dL (ref 6–23)
CO2: 25 mEq/L (ref 19–32)
Calcium: 8.6 mg/dL (ref 8.4–10.5)
Creatinine, Ser: 0.66 mg/dL (ref 0.4–1.5)
GFR calc Af Amer: >60 mL/min (ref >60)
GFR calc non Af Amer: >60 mL/min (ref >60)
Total Bilirubin: 0.9 mg/dL (ref 0.3–1.2)

## 2010-04-09 LAB — CBC
MCH: 27 pg (ref 26.0–34.0)
Platelets: 773 10*3/uL — ABNORMAL HIGH (ref 150–400)
RBC: 3.22 MIL/uL — ABNORMAL LOW (ref 4.22–5.81)
RDW: 15.2 % (ref 11.5–15.5)
WBC: 17.8 10*3/uL — ABNORMAL HIGH (ref 4.0–10.5)

## 2010-04-09 LAB — CULTURE, BLOOD (ROUTINE X 2): Culture  Setup Time: 201201210240

## 2010-04-09 LAB — RETICULOCYTES
Retic Count, Absolute: 167.4 10*3/uL (ref 19.0–186.0)
Retic Ct Pct: 5.2 % — ABNORMAL HIGH (ref 0.4–3.1)

## 2010-04-10 LAB — BASIC METABOLIC PANEL
CO2: 22 mEq/L (ref 19–32)
Calcium: 8.5 mg/dL (ref 8.4–10.5)
Chloride: 105 mEq/L (ref 96–112)
GFR calc Af Amer: >60 mL/min (ref >60)
Glucose, Bld: 107 mg/dL — ABNORMAL HIGH (ref 70–99)
Potassium: 3.6 mEq/L (ref 3.5–5.1)
Sodium: 137 mEq/L (ref 135–145)

## 2010-04-10 LAB — CBC
HCT: 26.3 % — ABNORMAL LOW (ref 39.0–52.0)
HCT: 28.2 % — ABNORMAL LOW (ref 39.0–52.0)
Hemoglobin: 8.4 g/dL — ABNORMAL LOW (ref 13.0–17.0)
Hemoglobin: 8.9 g/dL — ABNORMAL LOW (ref 13.0–17.0)
MCH: 26.3 pg (ref 26.0–34.0)
MCHC: 31.9 g/dL (ref 30.0–36.0)
MCHC: 31.6 g/dL (ref 30.0–36.0)
MCV: 83.4 fL (ref 78.0–100.0)
RBC: 3.16 MIL/uL — ABNORMAL LOW (ref 4.22–5.81)
RBC: 3.38 MIL/uL — ABNORMAL LOW (ref 4.22–5.81)

## 2010-04-11 LAB — COMPREHENSIVE METABOLIC PANEL
AST: 47 U/L — ABNORMAL HIGH (ref 0–37)
BUN: 5 mg/dL — ABNORMAL LOW (ref 6–23)
CO2: 24 mEq/L (ref 19–32)
Chloride: 103 mEq/L (ref 96–112)
Creatinine, Ser: 0.73 mg/dL (ref 0.4–1.5)
GFR calc non Af Amer: >60 mL/min (ref >60)
Total Bilirubin: 0.6 mg/dL (ref 0.3–1.2)

## 2010-04-11 LAB — URINE CULTURE: Colony Count: >=100000

## 2010-04-11 LAB — CBC
Hemoglobin: 9.4 g/dL — ABNORMAL LOW (ref 13.0–17.0)
MCHC: 30.8 g/dL (ref 30.0–36.0)

## 2010-04-12 ENCOUNTER — Inpatient Hospital Stay (HOSPITAL_COMMUNITY)
Admission: RE | Admit: 2010-04-12 | Discharge: 2010-04-23 | DRG: 945 | Disposition: A | Discharge status: Rehabilitation Facility | Payer: Medicaid Other | Source: Other Acute Inpatient Hospital | Attending: Physical Medicine & Rehabilitation | Admitting: Physical Medicine & Rehabilitation

## 2010-04-12 DIAGNOSIS — F17200 Nicotine dependence, unspecified, uncomplicated: Secondary | ICD-10-CM | POA: Diagnosis present

## 2010-04-12 DIAGNOSIS — S02109A Fracture of base of skull, unspecified side, initial encounter for closed fracture: Secondary | ICD-10-CM | POA: Diagnosis present

## 2010-04-12 DIAGNOSIS — S065XAA Traumatic subdural hemorrhage with loss of consciousness status unknown, initial encounter: Secondary | ICD-10-CM | POA: Diagnosis present

## 2010-04-12 DIAGNOSIS — S42009A Fracture of unspecified part of unspecified clavicle, initial encounter for closed fracture: Secondary | ICD-10-CM | POA: Diagnosis present

## 2010-04-12 DIAGNOSIS — S2249XA Multiple fractures of ribs, unspecified side, initial encounter for closed fracture: Secondary | ICD-10-CM | POA: Diagnosis present

## 2010-04-12 DIAGNOSIS — IMO0002 Reserved for concepts with insufficient information to code with codable children: Secondary | ICD-10-CM | POA: Diagnosis present

## 2010-04-12 DIAGNOSIS — F319 Bipolar disorder, unspecified: Secondary | ICD-10-CM | POA: Diagnosis present

## 2010-04-12 DIAGNOSIS — K2970 Gastritis, unspecified, without bleeding: Secondary | ICD-10-CM | POA: Diagnosis present

## 2010-04-12 DIAGNOSIS — Z5189 Encounter for other specified aftercare: Principal | ICD-10-CM

## 2010-04-13 LAB — COMPREHENSIVE METABOLIC PANEL
ALT: 137 U/L — ABNORMAL HIGH (ref 0–53)
AST: 24 U/L (ref 0–37)
Alkaline Phosphatase: 282 U/L — ABNORMAL HIGH (ref 39–117)
CO2: 25 mEq/L (ref 19–32)
Chloride: 101 mEq/L (ref 96–112)
GFR calc Af Amer: >60 mL/min (ref >60)
GFR calc non Af Amer: >60 mL/min (ref >60)
Potassium: 3.6 mEq/L (ref 3.5–5.1)
Sodium: 137 mEq/L (ref 135–145)
Total Bilirubin: 0.4 mg/dL (ref 0.3–1.2)

## 2010-04-13 LAB — DIFFERENTIAL
Basophils Absolute: 0 10*3/uL (ref 0.0–0.1)
Basophils Relative: 0 % (ref 0–1)
Monocytes Relative: 6 % (ref 3–12)
Neutro Abs: 6.1 10*3/uL (ref 1.7–7.7)
Neutrophils Relative %: 65 % (ref 43–77)

## 2010-04-13 LAB — CULTURE, BLOOD (ROUTINE X 2)
Culture  Setup Time: 201201251536
Culture: NO GROWTH

## 2010-04-13 LAB — CBC
Hemoglobin: 9.5 g/dL — ABNORMAL LOW (ref 13.0–17.0)
MCH: 27.2 pg (ref 26.0–34.0)
RBC: 3.49 MIL/uL — ABNORMAL LOW (ref 4.22–5.81)
WBC: 9.3 10*3/uL (ref 4.0–10.5)

## 2010-04-14 DIAGNOSIS — F319 Bipolar disorder, unspecified: Secondary | ICD-10-CM

## 2010-04-14 DIAGNOSIS — S069X9A Unspecified intracranial injury with loss of consciousness of unspecified duration, initial encounter: Secondary | ICD-10-CM

## 2010-04-14 DIAGNOSIS — S42009A Fracture of unspecified part of unspecified clavicle, initial encounter for closed fracture: Secondary | ICD-10-CM

## 2010-04-15 ENCOUNTER — Inpatient Hospital Stay (HOSPITAL_COMMUNITY): Payer: Medicaid Other

## 2010-04-15 LAB — URINALYSIS, ROUTINE W REFLEX MICROSCOPIC
Bilirubin Urine: NEGATIVE
Hgb urine dipstick: NEGATIVE
Nitrite: NEGATIVE
Specific Gravity, Urine: 1.021 (ref 1.005–1.030)
pH: 6 (ref 5.0–8.0)

## 2010-04-16 DIAGNOSIS — F4320 Adjustment disorder, unspecified: Secondary | ICD-10-CM

## 2010-04-16 LAB — CBC
HCT: 34.8 % — ABNORMAL LOW (ref 39.0–52.0)
Hemoglobin: 10.7 g/dL — ABNORMAL LOW (ref 13.0–17.0)
MCHC: 30.7 g/dL (ref 30.0–36.0)
RBC: 4.04 MIL/uL — ABNORMAL LOW (ref 4.22–5.81)

## 2010-04-16 LAB — BASIC METABOLIC PANEL
CO2: 26 mEq/L (ref 19–32)
Calcium: 9.2 mg/dL (ref 8.4–10.5)
Chloride: 103 mEq/L (ref 96–112)
GFR calc Af Amer: >60 mL/min (ref >60)
Glucose, Bld: 104 mg/dL — ABNORMAL HIGH (ref 70–99)
Sodium: 140 mEq/L (ref 135–145)

## 2010-04-16 LAB — DIFFERENTIAL
Basophils Absolute: 0 10*3/uL (ref 0.0–0.1)
Lymphocytes Relative: 35 % (ref 12–46)
Monocytes Absolute: 0.3 10*3/uL (ref 0.1–1.0)
Monocytes Relative: 6 % (ref 3–12)
Neutro Abs: 3.1 10*3/uL (ref 1.7–7.7)
Neutrophils Relative %: 51 % (ref 43–77)

## 2010-04-16 LAB — URINE CULTURE
Colony Count: NO GROWTH
Culture  Setup Time: 201202021700
Culture: NO GROWTH
Special Requests: NEGATIVE

## 2010-04-17 DIAGNOSIS — S42009A Fracture of unspecified part of unspecified clavicle, initial encounter for closed fracture: Secondary | ICD-10-CM

## 2010-04-17 DIAGNOSIS — F319 Bipolar disorder, unspecified: Secondary | ICD-10-CM

## 2010-04-17 DIAGNOSIS — S069X9A Unspecified intracranial injury with loss of consciousness of unspecified duration, initial encounter: Secondary | ICD-10-CM

## 2010-04-20 LAB — CARBAMAZEPINE LEVEL, TOTAL: Carbamazepine Lvl: 3.9 ug/mL — ABNORMAL LOW (ref 4.0–12.0)
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
## 2010-04-28 ENCOUNTER — Ambulatory Visit: Payer: Medicaid Other | Attending: Physical Medicine & Rehabilitation

## 2010-04-28 ENCOUNTER — Ambulatory Visit: Payer: Medicaid Other | Admitting: Physical Therapy

## 2010-04-28 ENCOUNTER — Ambulatory Visit: Payer: Medicaid Other | Admitting: Occupational Therapy

## 2010-04-28 DIAGNOSIS — R269 Unspecified abnormalities of gait and mobility: Secondary | ICD-10-CM | POA: Insufficient documentation

## 2010-04-28 DIAGNOSIS — R4189 Other symptoms and signs involving cognitive functions and awareness: Secondary | ICD-10-CM | POA: Insufficient documentation

## 2010-04-28 DIAGNOSIS — M2550 Pain in unspecified joint: Secondary | ICD-10-CM | POA: Insufficient documentation

## 2010-04-28 DIAGNOSIS — M6281 Muscle weakness (generalized): Secondary | ICD-10-CM | POA: Insufficient documentation

## 2010-04-28 DIAGNOSIS — R41844 Frontal lobe and executive function deficit: Secondary | ICD-10-CM | POA: Insufficient documentation

## 2010-04-28 DIAGNOSIS — Z5189 Encounter for other specified aftercare: Secondary | ICD-10-CM | POA: Insufficient documentation

## 2010-05-03 ENCOUNTER — Encounter: Payer: No Typology Code available for payment source | Admitting: Speech Pathology

## 2010-05-03 ENCOUNTER — Encounter: Payer: No Typology Code available for payment source | Admitting: Occupational Therapy

## 2010-05-03 ENCOUNTER — Ambulatory Visit: Payer: No Typology Code available for payment source | Admitting: Physical Therapy
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
## 2010-05-06 ENCOUNTER — Emergency Department (HOSPITAL_COMMUNITY)
Admission: EM | Admit: 2010-05-06 | Discharge: 2010-05-06 | Discharge status: Against Medical Advice | Payer: Medicaid Other | Attending: Emergency Medicine | Admitting: Emergency Medicine

## 2010-05-06 DIAGNOSIS — R221 Localized swelling, mass and lump, neck: Secondary | ICD-10-CM | POA: Insufficient documentation

## 2010-05-06 DIAGNOSIS — R220 Localized swelling, mass and lump, head: Secondary | ICD-10-CM | POA: Insufficient documentation

## 2010-05-10 ENCOUNTER — Emergency Department (HOSPITAL_COMMUNITY): Payer: Medicaid Other

## 2010-05-10 ENCOUNTER — Encounter: Payer: No Typology Code available for payment source | Admitting: Speech Pathology

## 2010-05-10 ENCOUNTER — Ambulatory Visit: Payer: No Typology Code available for payment source | Admitting: Physical Therapy

## 2010-05-10 ENCOUNTER — Encounter: Payer: No Typology Code available for payment source | Admitting: Occupational Therapy

## 2010-05-10 ENCOUNTER — Emergency Department (HOSPITAL_COMMUNITY)
Admission: EM | Admit: 2010-05-10 | Discharge: 2010-05-10 | Disposition: A | Discharge status: Home / Self Care | Payer: Medicaid Other | Attending: Emergency Medicine | Admitting: Emergency Medicine

## 2010-05-10 DIAGNOSIS — Z9889 Other specified postprocedural states: Secondary | ICD-10-CM | POA: Insufficient documentation

## 2010-05-10 DIAGNOSIS — R220 Localized swelling, mass and lump, head: Secondary | ICD-10-CM | POA: Insufficient documentation

## 2010-05-10 DIAGNOSIS — R221 Localized swelling, mass and lump, neck: Secondary | ICD-10-CM | POA: Insufficient documentation

## 2010-05-10 DIAGNOSIS — Z87828 Personal history of other (healed) physical injury and trauma: Secondary | ICD-10-CM | POA: Insufficient documentation

## 2010-05-10 DIAGNOSIS — H5704 Mydriasis: Secondary | ICD-10-CM | POA: Insufficient documentation

## 2010-05-10 DIAGNOSIS — Z79899 Other long term (current) drug therapy: Secondary | ICD-10-CM | POA: Insufficient documentation

## 2010-05-10 DIAGNOSIS — R51 Headache: Secondary | ICD-10-CM | POA: Insufficient documentation

## 2010-05-10 DIAGNOSIS — F319 Bipolar disorder, unspecified: Secondary | ICD-10-CM | POA: Insufficient documentation

## 2010-05-12 ENCOUNTER — Ambulatory Visit: Payer: Medicaid Other | Admitting: Occupational Therapy

## 2010-05-12 ENCOUNTER — Ambulatory Visit: Payer: Medicaid Other | Admitting: Speech Pathology

## 2010-05-12 ENCOUNTER — Ambulatory Visit: Payer: Medicaid Other | Admitting: Physical Therapy

## 2010-05-17 ENCOUNTER — Ambulatory Visit: Payer: Medicaid Other | Admitting: Physical Therapy

## 2010-05-17 ENCOUNTER — Ambulatory Visit: Payer: Medicaid Other | Attending: Physical Medicine & Rehabilitation | Admitting: Occupational Therapy

## 2010-05-17 ENCOUNTER — Ambulatory Visit: Payer: Medicaid Other | Admitting: Speech Pathology

## 2010-05-17 DIAGNOSIS — R4189 Other symptoms and signs involving cognitive functions and awareness: Secondary | ICD-10-CM | POA: Insufficient documentation

## 2010-05-17 DIAGNOSIS — R269 Unspecified abnormalities of gait and mobility: Secondary | ICD-10-CM | POA: Insufficient documentation

## 2010-05-17 DIAGNOSIS — M2550 Pain in unspecified joint: Secondary | ICD-10-CM | POA: Insufficient documentation

## 2010-05-17 DIAGNOSIS — Z5189 Encounter for other specified aftercare: Secondary | ICD-10-CM | POA: Insufficient documentation

## 2010-05-17 DIAGNOSIS — R41844 Frontal lobe and executive function deficit: Secondary | ICD-10-CM | POA: Insufficient documentation

## 2010-05-17 DIAGNOSIS — M6281 Muscle weakness (generalized): Secondary | ICD-10-CM | POA: Insufficient documentation

## 2010-05-21 ENCOUNTER — Ambulatory Visit: Payer: Medicaid Other | Admitting: Occupational Therapy

## 2010-05-21 ENCOUNTER — Ambulatory Visit: Payer: Medicaid Other

## 2010-05-21 ENCOUNTER — Ambulatory Visit: Payer: Medicaid Other | Admitting: Physical Therapy

## 2010-05-24 ENCOUNTER — Ambulatory Visit: Payer: No Typology Code available for payment source | Admitting: Physical Therapy

## 2010-05-24 ENCOUNTER — Encounter: Payer: No Typology Code available for payment source | Admitting: Occupational Therapy

## 2010-05-25 ENCOUNTER — Encounter: Payer: Medicaid Other | Attending: Physical Medicine & Rehabilitation

## 2010-05-25 ENCOUNTER — Inpatient Hospital Stay: Payer: No Typology Code available for payment source | Admitting: Physical Medicine & Rehabilitation

## 2010-05-25 DIAGNOSIS — S01119A Laceration without foreign body of unspecified eyelid and periocular area, initial encounter: Secondary | ICD-10-CM

## 2010-05-25 DIAGNOSIS — F070 Personality change due to known physiological condition: Secondary | ICD-10-CM

## 2010-05-25 DIAGNOSIS — S0291XA Unspecified fracture of skull, initial encounter for closed fracture: Secondary | ICD-10-CM | POA: Insufficient documentation

## 2010-05-25 DIAGNOSIS — R109 Unspecified abdominal pain: Secondary | ICD-10-CM | POA: Insufficient documentation

## 2010-05-25 DIAGNOSIS — X58XXXA Exposure to other specified factors, initial encounter: Secondary | ICD-10-CM | POA: Insufficient documentation

## 2010-05-25 DIAGNOSIS — F411 Generalized anxiety disorder: Secondary | ICD-10-CM | POA: Insufficient documentation

## 2010-05-25 DIAGNOSIS — S42009A Fracture of unspecified part of unspecified clavicle, initial encounter for closed fracture: Secondary | ICD-10-CM

## 2010-05-25 DIAGNOSIS — R0789 Other chest pain: Secondary | ICD-10-CM | POA: Insufficient documentation

## 2010-05-25 DIAGNOSIS — M25519 Pain in unspecified shoulder: Secondary | ICD-10-CM | POA: Insufficient documentation

## 2010-05-25 DIAGNOSIS — S069X9A Unspecified intracranial injury with loss of consciousness of unspecified duration, initial encounter: Secondary | ICD-10-CM

## 2010-05-25 DIAGNOSIS — S069X0A Unspecified intracranial injury without loss of consciousness, initial encounter: Secondary | ICD-10-CM | POA: Insufficient documentation

## 2010-05-25 DIAGNOSIS — S0280XA Fracture of other specified skull and facial bones, unspecified side, initial encounter for closed fracture: Secondary | ICD-10-CM | POA: Insufficient documentation

## 2010-05-25 DIAGNOSIS — R51 Headache: Secondary | ICD-10-CM | POA: Insufficient documentation

## 2010-05-25 DIAGNOSIS — R6884 Jaw pain: Secondary | ICD-10-CM | POA: Insufficient documentation
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
## 2010-05-28 ENCOUNTER — Ambulatory Visit: Payer: Medicaid Other | Admitting: Occupational Therapy

## 2010-05-28 ENCOUNTER — Other Ambulatory Visit (HOSPITAL_COMMUNITY): Payer: Self-pay | Admitting: Neurosurgery

## 2010-05-28 ENCOUNTER — Encounter (HOSPITAL_COMMUNITY)
Admission: RE | Admit: 2010-05-28 | Discharge: 2010-05-28 | Disposition: A | Discharge status: Home / Self Care | Payer: Medicaid Other | Source: Ambulatory Visit | Attending: Neurosurgery | Admitting: Neurosurgery

## 2010-05-28 ENCOUNTER — Ambulatory Visit: Payer: Medicaid Other | Admitting: Physical Therapy

## 2010-05-28 ENCOUNTER — Ambulatory Visit: Payer: Medicaid Other

## 2010-05-28 DIAGNOSIS — Z01818 Encounter for other preprocedural examination: Secondary | ICD-10-CM | POA: Insufficient documentation

## 2010-05-28 DIAGNOSIS — F17200 Nicotine dependence, unspecified, uncomplicated: Secondary | ICD-10-CM

## 2010-05-28 DIAGNOSIS — Z01812 Encounter for preprocedural laboratory examination: Secondary | ICD-10-CM | POA: Insufficient documentation

## 2010-05-28 LAB — CBC
Hemoglobin: 13.4 g/dL (ref 13.0–17.0)
MCH: 27.3 pg (ref 26.0–34.0)
MCHC: 33.8 g/dL (ref 30.0–36.0)
Platelets: 211 10*3/uL (ref 150–400)
RDW: 14.1 % (ref 11.5–15.5)

## 2010-05-28 LAB — SURGICAL PCR SCREEN: Staphylococcus aureus: NEGATIVE

## 2010-05-28 LAB — BASIC METABOLIC PANEL
CO2: 30 mEq/L (ref 19–32)
Calcium: 9.4 mg/dL (ref 8.4–10.5)
Creatinine, Ser: 0.74 mg/dL (ref 0.4–1.5)
GFR calc Af Amer: >60 mL/min (ref >60)
GFR calc non Af Amer: >60 mL/min (ref >60)
Sodium: 128 mEq/L — ABNORMAL LOW (ref 135–145)

## 2010-05-28 LAB — NO BLOOD PRODUCTS

## 2010-06-03 ENCOUNTER — Inpatient Hospital Stay (HOSPITAL_COMMUNITY)
Admission: RE | Admit: 2010-06-03 | Discharge: 2010-06-08 | DRG: 516 | Disposition: A | Discharge status: Home / Self Care | Payer: Medicaid Other | Source: Ambulatory Visit | Attending: Neurosurgery | Admitting: Neurosurgery

## 2010-06-03 DIAGNOSIS — Y838 Other surgical procedures as the cause of abnormal reaction of the patient, or of later complication, without mention of misadventure at the time of the procedure: Secondary | ICD-10-CM | POA: Diagnosis not present

## 2010-06-03 DIAGNOSIS — IMO0002 Reserved for concepts with insufficient information to code with codable children: Secondary | ICD-10-CM | POA: Diagnosis not present

## 2010-06-03 DIAGNOSIS — J4489 Other specified chronic obstructive pulmonary disease: Secondary | ICD-10-CM | POA: Diagnosis present

## 2010-06-03 DIAGNOSIS — Z01812 Encounter for preprocedural laboratory examination: Secondary | ICD-10-CM

## 2010-06-03 DIAGNOSIS — M952 Other acquired deformity of head: Principal | ICD-10-CM | POA: Diagnosis present

## 2010-06-03 DIAGNOSIS — Z01818 Encounter for other preprocedural examination: Secondary | ICD-10-CM

## 2010-06-03 DIAGNOSIS — I10 Essential (primary) hypertension: Secondary | ICD-10-CM | POA: Diagnosis present

## 2010-06-03 DIAGNOSIS — J449 Chronic obstructive pulmonary disease, unspecified: Secondary | ICD-10-CM | POA: Diagnosis present

## 2010-06-04 ENCOUNTER — Inpatient Hospital Stay (HOSPITAL_COMMUNITY): Payer: Medicaid Other

## 2010-06-04 LAB — PROTIME-INR: INR: 1.05 (ref 0.00–1.49)

## 2010-06-04 LAB — APTT: aPTT: 28 seconds (ref 24–37)

## 2010-06-05 ENCOUNTER — Inpatient Hospital Stay (HOSPITAL_COMMUNITY): Payer: Medicaid Other
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
## 2010-06-08 ENCOUNTER — Inpatient Hospital Stay (HOSPITAL_COMMUNITY): Payer: Medicaid Other

## 2010-06-08 LAB — URINALYSIS, ROUTINE W REFLEX MICROSCOPIC
Bilirubin Urine: NEGATIVE
Nitrite: NEGATIVE
Specific Gravity, Urine: 1.014 (ref 1.005–1.030)
Urobilinogen, UA: 0.2 mg/dL (ref 0.0–1.0)

## 2010-06-09 LAB — URINE CULTURE

## 2010-06-14 ENCOUNTER — Encounter: Payer: No Typology Code available for payment source | Admitting: Occupational Therapy

## 2010-06-14 ENCOUNTER — Ambulatory Visit: Payer: Self-pay | Admitting: Rehabilitative and Restorative Service Providers"

## 2010-06-16 ENCOUNTER — Other Ambulatory Visit: Payer: Self-pay | Admitting: Neurosurgery

## 2010-06-16 ENCOUNTER — Ambulatory Visit: Payer: No Typology Code available for payment source | Admitting: Physical Therapy

## 2010-06-16 ENCOUNTER — Encounter: Payer: No Typology Code available for payment source | Admitting: Occupational Therapy

## 2010-06-16 DIAGNOSIS — S065X9A Traumatic subdural hemorrhage with loss of consciousness of unspecified duration, initial encounter: Secondary | ICD-10-CM

## 2010-06-18 ENCOUNTER — Other Ambulatory Visit: Payer: Self-pay

## 2010-06-21 ENCOUNTER — Encounter: Payer: No Typology Code available for payment source | Admitting: Occupational Therapy

## 2010-06-21 ENCOUNTER — Ambulatory Visit: Payer: No Typology Code available for payment source | Admitting: Physical Therapy

## 2010-06-23 ENCOUNTER — Ambulatory Visit: Payer: No Typology Code available for payment source | Admitting: Physical Therapy

## 2010-06-23 ENCOUNTER — Encounter: Payer: No Typology Code available for payment source | Admitting: Occupational Therapy

## 2010-06-29 ENCOUNTER — Ambulatory Visit: Payer: No Typology Code available for payment source | Admitting: Physical Therapy

## 2010-06-30 ENCOUNTER — Encounter: Payer: Self-pay | Admitting: Physical Medicine & Rehabilitation

## 2010-06-30 ENCOUNTER — Ambulatory Visit
Admission: RE | Admit: 2010-06-30 | Discharge: 2010-06-30 | Disposition: A | Discharge status: Home / Self Care | Payer: No Typology Code available for payment source | Source: Ambulatory Visit | Attending: Neurosurgery | Admitting: Neurosurgery

## 2010-06-30 DIAGNOSIS — S065X9A Traumatic subdural hemorrhage with loss of consciousness of unspecified duration, initial encounter: Secondary | ICD-10-CM

## 2010-07-01 ENCOUNTER — Ambulatory Visit: Payer: No Typology Code available for payment source | Admitting: Physical Therapy

## 2010-07-01 ENCOUNTER — Ambulatory Visit: Payer: Medicaid Other | Attending: Physical Medicine & Rehabilitation | Admitting: Physical Therapy

## 2010-07-01 ENCOUNTER — Ambulatory Visit: Payer: Medicaid Other

## 2010-07-01 DIAGNOSIS — Z5189 Encounter for other specified aftercare: Secondary | ICD-10-CM | POA: Insufficient documentation

## 2010-07-01 DIAGNOSIS — R269 Unspecified abnormalities of gait and mobility: Secondary | ICD-10-CM | POA: Insufficient documentation

## 2010-07-01 DIAGNOSIS — M6281 Muscle weakness (generalized): Secondary | ICD-10-CM | POA: Insufficient documentation

## 2010-07-01 DIAGNOSIS — R4189 Other symptoms and signs involving cognitive functions and awareness: Secondary | ICD-10-CM | POA: Insufficient documentation

## 2010-07-01 DIAGNOSIS — M2550 Pain in unspecified joint: Secondary | ICD-10-CM | POA: Insufficient documentation

## 2010-07-01 DIAGNOSIS — R41844 Frontal lobe and executive function deficit: Secondary | ICD-10-CM | POA: Insufficient documentation
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
## 2010-07-05 ENCOUNTER — Ambulatory Visit: Payer: Medicaid Other

## 2010-07-05 ENCOUNTER — Ambulatory Visit: Payer: Medicaid Other | Admitting: Occupational Therapy

## 2010-07-05 ENCOUNTER — Ambulatory Visit: Payer: No Typology Code available for payment source | Admitting: Physical Therapy

## 2010-07-07 ENCOUNTER — Encounter: Payer: No Typology Code available for payment source | Admitting: Occupational Therapy

## 2010-07-07 ENCOUNTER — Ambulatory Visit: Payer: No Typology Code available for payment source | Admitting: Physical Therapy

## 2010-07-08 ENCOUNTER — Other Ambulatory Visit: Payer: Self-pay | Admitting: Neurosurgery

## 2010-07-08 DIAGNOSIS — S0291XA Unspecified fracture of skull, initial encounter for closed fracture: Secondary | ICD-10-CM
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
## 2010-07-14 ENCOUNTER — Ambulatory Visit
Admission: RE | Admit: 2010-07-14 | Discharge: 2010-07-14 | Disposition: A | Discharge status: Home / Self Care | Payer: No Typology Code available for payment source | Source: Ambulatory Visit | Attending: Neurosurgery | Admitting: Neurosurgery

## 2010-07-14 DIAGNOSIS — S0291XA Unspecified fracture of skull, initial encounter for closed fracture: Secondary | ICD-10-CM

## 2010-07-26 ENCOUNTER — Encounter: Payer: Self-pay | Attending: Physical Medicine & Rehabilitation | Admitting: Physical Medicine & Rehabilitation

## 2010-08-21 ENCOUNTER — Emergency Department (HOSPITAL_COMMUNITY)
Admission: EM | Admit: 2010-08-21 | Discharge: 2010-08-21 | Disposition: A | Discharge status: Home / Self Care | Payer: Medicaid Other | Source: Home / Self Care | Attending: Emergency Medicine | Admitting: Emergency Medicine

## 2010-08-21 DIAGNOSIS — F319 Bipolar disorder, unspecified: Secondary | ICD-10-CM | POA: Insufficient documentation

## 2010-08-21 DIAGNOSIS — R599 Enlarged lymph nodes, unspecified: Secondary | ICD-10-CM | POA: Insufficient documentation

## 2010-08-22 ENCOUNTER — Inpatient Hospital Stay (HOSPITAL_COMMUNITY)
Admission: EM | Admit: 2010-08-22 | Discharge: 2010-08-25 | DRG: 069 | Disposition: A | Discharge status: Home / Self Care | Payer: Medicaid Other | Attending: Internal Medicine | Admitting: Internal Medicine

## 2010-08-22 ENCOUNTER — Emergency Department (HOSPITAL_COMMUNITY): Payer: Medicaid Other

## 2010-08-22 DIAGNOSIS — F319 Bipolar disorder, unspecified: Secondary | ICD-10-CM | POA: Diagnosis present

## 2010-08-22 DIAGNOSIS — R7402 Elevation of levels of lactic acid dehydrogenase (LDH): Secondary | ICD-10-CM | POA: Diagnosis present

## 2010-08-22 DIAGNOSIS — R7401 Elevation of levels of liver transaminase levels: Secondary | ICD-10-CM | POA: Diagnosis present

## 2010-08-22 DIAGNOSIS — G459 Transient cerebral ischemic attack, unspecified: Principal | ICD-10-CM | POA: Diagnosis present

## 2010-08-22 DIAGNOSIS — J020 Streptococcal pharyngitis: Secondary | ICD-10-CM | POA: Diagnosis present

## 2010-08-22 DIAGNOSIS — R4789 Other speech disturbances: Secondary | ICD-10-CM | POA: Diagnosis present

## 2010-08-22 DIAGNOSIS — F17200 Nicotine dependence, unspecified, uncomplicated: Secondary | ICD-10-CM | POA: Diagnosis present

## 2010-08-22 DIAGNOSIS — F411 Generalized anxiety disorder: Secondary | ICD-10-CM | POA: Diagnosis present

## 2010-08-22 LAB — CBC
HCT: 35.8 % — ABNORMAL LOW (ref 39.0–52.0)
Hemoglobin: 12.4 g/dL — ABNORMAL LOW (ref 13.0–17.0)
MCHC: 34.6 g/dL (ref 30.0–36.0)
WBC: 13.2 10*3/uL — ABNORMAL HIGH (ref 4.0–10.5)

## 2010-08-22 LAB — BASIC METABOLIC PANEL
BUN: 11 mg/dL (ref 6–23)
Chloride: 94 mEq/L — ABNORMAL LOW (ref 96–112)
Glucose, Bld: 121 mg/dL — ABNORMAL HIGH (ref 70–99)
Potassium: 3.8 mEq/L (ref 3.5–5.1)

## 2010-08-22 LAB — HEPATIC FUNCTION PANEL
ALT: 67 U/L — ABNORMAL HIGH (ref 0–53)
AST: 42 U/L — ABNORMAL HIGH (ref 0–37)
Bilirubin, Direct: 0.1 mg/dL (ref 0.0–0.3)
Total Bilirubin: 0.2 mg/dL — ABNORMAL LOW (ref 0.3–1.2)

## 2010-08-23 ENCOUNTER — Inpatient Hospital Stay (HOSPITAL_COMMUNITY): Payer: Medicaid Other

## 2010-08-23 LAB — BASIC METABOLIC PANEL
CO2: 27 mEq/L (ref 19–32)
Chloride: 99 mEq/L (ref 96–112)
Creatinine, Ser: 0.65 mg/dL (ref 0.4–1.5)
Potassium: 3.9 mEq/L (ref 3.5–5.1)

## 2010-08-23 LAB — DIFFERENTIAL
Basophils Absolute: 0 10*3/uL (ref 0.0–0.1)
Eosinophils Absolute: 0.1 10*3/uL (ref 0.0–0.7)
Lymphocytes Relative: 11 % — ABNORMAL LOW (ref 12–46)
Lymphs Abs: 1.6 10*3/uL (ref 0.7–4.0)
Neutrophils Relative %: 80 % — ABNORMAL HIGH (ref 43–77)

## 2010-08-23 LAB — LIPID PANEL
HDL: 39 mg/dL — ABNORMAL LOW (ref >39)
LDL Cholesterol: 135 mg/dL — ABNORMAL HIGH (ref 0–99)
Total CHOL/HDL Ratio: 5.2 RATIO
Triglycerides: 139 mg/dL (ref <150)
VLDL: 28 mg/dL (ref 0–40)

## 2010-08-23 LAB — VITAMIN B12: Vitamin B-12: 555 pg/mL (ref 211–911)

## 2010-08-23 LAB — CBC
MCV: 80.4 fL (ref 78.0–100.0)
Platelets: 270 10*3/uL (ref 150–400)
RBC: 4.45 MIL/uL (ref 4.22–5.81)
WBC: 14.5 10*3/uL — ABNORMAL HIGH (ref 4.0–10.5)

## 2010-08-23 LAB — URINALYSIS, ROUTINE W REFLEX MICROSCOPIC
Bilirubin Urine: NEGATIVE
Hgb urine dipstick: NEGATIVE
Nitrite: NEGATIVE
Specific Gravity, Urine: 1.014 (ref 1.005–1.030)
pH: 6.5 (ref 5.0–8.0)

## 2010-08-24 ENCOUNTER — Observation Stay (HOSPITAL_COMMUNITY): Payer: Medicaid Other

## 2010-08-24 LAB — COMPREHENSIVE METABOLIC PANEL
Alkaline Phosphatase: 127 U/L — ABNORMAL HIGH (ref 39–117)
BUN: 5 mg/dL — ABNORMAL LOW (ref 6–23)
CO2: 29 mEq/L (ref 19–32)
Chloride: 101 mEq/L (ref 96–112)
Creatinine, Ser: 0.67 mg/dL (ref 0.4–1.5)
GFR calc non Af Amer: >60 mL/min (ref >60)
Glucose, Bld: 116 mg/dL — ABNORMAL HIGH (ref 70–99)
Total Bilirubin: 0.2 mg/dL — ABNORMAL LOW (ref 0.3–1.2)

## 2010-08-24 LAB — CBC
HCT: 33.8 % — ABNORMAL LOW (ref 39.0–52.0)
Hemoglobin: 11.5 g/dL — ABNORMAL LOW (ref 13.0–17.0)
MCV: 80.7 fL (ref 78.0–100.0)
RBC: 4.19 MIL/uL — ABNORMAL LOW (ref 4.22–5.81)
WBC: 10.6 10*3/uL — ABNORMAL HIGH (ref 4.0–10.5)
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
## 2010-08-25 ENCOUNTER — Inpatient Hospital Stay (HOSPITAL_COMMUNITY): Payer: Medicaid Other

## 2010-08-25 LAB — BASIC METABOLIC PANEL
BUN: 4 mg/dL — ABNORMAL LOW (ref 6–23)
CO2: 28 mEq/L (ref 19–32)
Chloride: 103 mEq/L (ref 96–112)
Creatinine, Ser: 0.66 mg/dL (ref 0.4–1.5)
Potassium: 3.9 mEq/L (ref 3.5–5.1)

## 2010-08-25 LAB — CULTURE, BLOOD (ROUTINE X 2)

## 2010-08-25 LAB — CBC
HCT: 33.9 % — ABNORMAL LOW (ref 39.0–52.0)
Hemoglobin: 11.6 g/dL — ABNORMAL LOW (ref 13.0–17.0)
MCHC: 34.2 g/dL (ref 30.0–36.0)
MCV: 81.3 fL (ref 78.0–100.0)
RDW: 15.1 % (ref 11.5–15.5)
WBC: 6.8 10*3/uL (ref 4.0–10.5)

## 2010-08-26 NOTE — Procedures (Unsigned)
HISTORY:  A 40 year old man admitted with slurring of speech and with altered mental status and dizzy spells.  EEG is for further evaluation.  MEDICATIONS:  Nicoderm, Antivert, Klonopin, lidocaine patch, Protonix, Augmentin, Desyrel, Celexa, Catapres, Tegretol, Vancomycin, Ultram.  EEG DURATION:  This is 22 minutes of EEG recording.  EEG DESCRIPTION:  This is a routine 18 channel adult EEG recording with one channel devoted to limited EKG recording.  Activation procedure was performed during the photic stimulation the stud is performed in awake and drowsy state.  As the EEG opens up, I noticed that the posterior dominant rhythm consist of 8-9 Hz frequency with an amplitude ranging between 12-20 microvolts.  The rhythm is symmetrical and continuous. The left hemispheric frontoparietal leads seems to represent what is known as the breach rhythm and correlates with the patient's known history of craniotomy.  I do notice some vertex waves and synchronous spindles as the patient got drowsy during the study.  However, I do not see any epileptiform discharges or electrographic seizures during this study.  EEG INTERPRETATION:  This is a normal awake and drowsy EEG.  There is no evidence to suggest electrographic seizures or epileptiform discharges. The presence of left  frontotemporoparietal breach rhythm correlates with the patient's known history of craniotomy.          ______________________________ Levie Heritage, MD    EA:[id number] D:  08/25/2010 16:01:13  T:  08/26/2010 07:05:55  Job #:  [id number]

## 2010-08-27 ENCOUNTER — Other Ambulatory Visit: Payer: Self-pay | Admitting: Neurosurgery

## 2010-08-27 DIAGNOSIS — S065X9A Traumatic subdural hemorrhage with loss of consciousness of unspecified duration, initial encounter: Secondary | ICD-10-CM

## 2010-08-29 LAB — CULTURE, BLOOD (ROUTINE X 2)
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
## 2010-09-30 NOTE — Discharge Summary (Signed)
[name], [name]              ACCOUNT NO.:  [id number]  MEDICAL RECORD NO.:  [id number]  LOCATION:  3703                         FACILITY:  [facility]  PHYSICIAN:  Calvert Cantor, M.D.     DATE OF BIRTH:  21-Sep-1970  DATE OF ADMISSION:  08/22/2010 DATE OF DISCHARGE:  08/25/2010                              DISCHARGE SUMMARY   PRIMARY CARE PHYSICIAN:  Kirk Ruths, MD, in [place].  PRESENTING COMPLAINT:  Dizziness and slurred speech.  DISCHARGE DIAGNOSES: 1. Dizziness and slurred speech, possibly related to a transient     ischemic attack. 2. Strep throat. 3. Elevated LFTs.  Further workup to be done as outpatient.  PAST MEDICAL HISTORY: 1. Bipolar disorder. 2. Traumatic brain injury 5 months ago with subdural hematoma, status     post craniotomy.  The patient had resultant epidural hematoma when     a craniotomy flap was being performed.  DISCHARGE MEDICATIONS: 1. Augmentin 875 by mouth twice a day for 5 more days to complete a 1-     week course. 2. Nicotine patch 14 mg daily take for 14 days and then stop.  Continue the following home meds: 1. Carbamazepine 200 mg three times a day. 2. Citalopram 20 mg daily at bedtime. 3. Clonazepam 1 mg three times a day as needed for anxiety. 4. Clonidine 0.2 mg three times a day. 5. Oxycodone CR 10 mg tab, half to one tablet by mouth every 4 hours     as needed for pain. 6. Prilosec 20 mg 2 capsules daily. 7. Tramadol 50 mg every 6 hours as needed for pain. 8. Trazodone 50 mg half to one tablet by mouth twice a day as needed.  PROCEDURES:  CT of the head without contrast August 22, 2010, revealed persistent hypertensive component of an extra-axial fluid collection adjacent to the left craniotomy.  This is likely chronic.  Decreased hypodense component more superiorly in the extra-axial collection. Stable encephalomalacia of the lateral left temporal lobe.  Stable 3-mm midline shift.  No acute intracranial abnormality or  significant interval change.  Chest x-ray two-view performed on August 23, 2010, revealed mild airway thickening suggesting bronchitis or reactive airway disease.  Callus formation along the right midclavicular fracture was noted.  Ultrasound abdomen August 24, 2010, revealed decompressed gallbladder without cholelithiasis or evidence of cholecystitis.  Negative abdominal ultrasound otherwise.  EEG, official report is pending.  However, EEG is negative for epilepsy.  PERTINENT BLOOD WORK:  WBC count on admission was 13.2, which has normalized to 6.8.  AST slightly elevated at 63, ALT slightly elevated at 113, alkaline phosphatase 127, total bilirubin 0.2.  Cholesterol 202, HDL 39, LDL 135, triglycerides 139.  Thyroid studies were normal.  Blood cultures, he had 2 sets done, one of which grew out coag-negative staph which is a contaminant.  HOSPITAL COURSE:  This is a 40 year old male who presented with dizziness and slurred speech.  At that time it was difficult to tell whether this was a TIA or CVA.  Symptoms have resolved.  Workup did not reveal a CVA.  EEG did not reveal any seizure-like activity.  Symptoms were not very likely to be secondary to seizure.  However EEG was done to essentially complete the workup.  The patient's symptoms have resolved completely and he states that he is anxious to go home now.  He did have a strep throat which is improving on Augmentin.  He is able to swallow appropriately.  He was noted to have elevated LFTs.  Ultrasound of the abdomen did not reveal any abnormality, questioning whether this could be related to medications.  Looking back at his blood work, it is noted that they began to increase on March 29, 2010, not sure what new medication was added at this time.  It may have been carbamazepine.  Anyhow, LFTs should be continued to monitored as outpatient and further workup done if needed.  We have not yet done a hepatitis  profile.  The patient has had a physical therapy eval and is safe to go home without any PT.  PHYSICAL EXAMINATION:  LUNGS:  Clear bilaterally.  Normal respiratory effort. HEART:  Regular rate and rhythm.  No murmurs. ABDOMEN:  Soft, nontender, nondistended.  Bowel sounds positive.  No organomegaly. EXTREMITIES:  No cyanosis, clubbing or edema. NEUROLOGIC:  Cranial nerves II-XII are intact.  Strength intact in all four extremities.  CONDITION ON DISCHARGE:  Stable.  FOLLOWUP INSTRUCTIONS:  Follow up with Dr. Regino Schultze in 1-2 weeks.  As mentioned above, LFTs need to be followed and further workup done.  Time on discharge was 50 minutes.     Calvert Cantor, M.D.     SR/MEDQ  D:  08/25/2010  T:  08/26/2010  Job:  [id number]  cc:   Kirk Ruths, M.D.  Electronically Signed by Calvert Cantor M.D. on 09/30/2010 12:10:31 PM

## 2010-10-13 ENCOUNTER — Ambulatory Visit
Admission: RE | Admit: 2010-10-13 | Discharge: 2010-10-13 | Disposition: A | Discharge status: Home / Self Care | Payer: No Typology Code available for payment source | Source: Ambulatory Visit | Attending: Neurosurgery | Admitting: Neurosurgery

## 2010-10-13 DIAGNOSIS — S065X9A Traumatic subdural hemorrhage with loss of consciousness of unspecified duration, initial encounter: Secondary | ICD-10-CM

## 2011-02-07 ENCOUNTER — Other Ambulatory Visit: Payer: Self-pay | Admitting: Neurosurgery

## 2011-02-07 DIAGNOSIS — S02109A Fracture of base of skull, unspecified side, initial encounter for closed fracture: Secondary | ICD-10-CM

## 2011-02-09 ENCOUNTER — Ambulatory Visit
Admission: RE | Admit: 2011-02-09 | Discharge: 2011-02-09 | Disposition: A | Discharge status: Home / Self Care | Payer: Medicaid Other | Source: Ambulatory Visit | Attending: Neurosurgery | Admitting: Neurosurgery

## 2011-02-09 DIAGNOSIS — S02109A Fracture of base of skull, unspecified side, initial encounter for closed fracture: Secondary | ICD-10-CM

## 2011-06-28 IMAGING — CT CT HEAD W/O CM
1 series · 15 of 30 positions shown, 19 images · non-contrast
Comparison: 03/29/2010

CLINICAL DATA: Headache.  Confusion.  Recent craniotomy for trauma.

CT HEAD WITHOUT CONTRAST
TECHNIQUE: Contiguous axial images were obtained from the base of
the skull through the vertex without contrast.

[Series 2: head routine 4.8 h37s · axial · 0.44mm/px · z∈[-134,+23]mm · 15 of 36 slices shown, 19 images]
[im 2/36  brain]
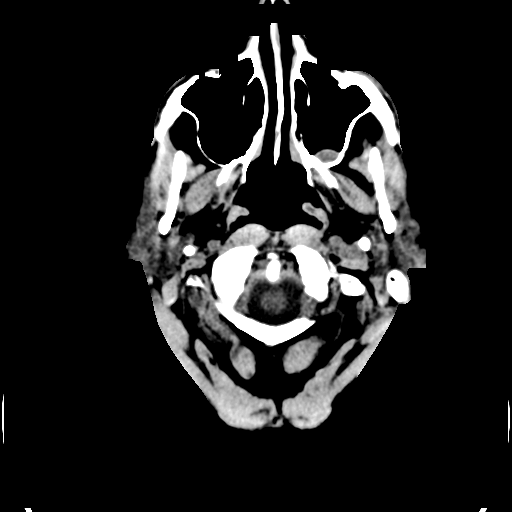
[im 2/36  bone]
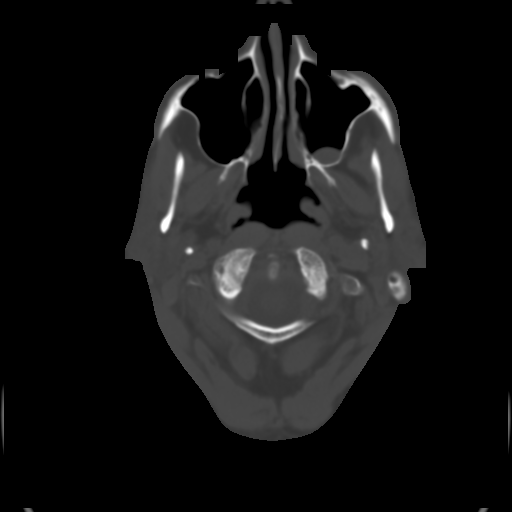
[im 4/36  brain]
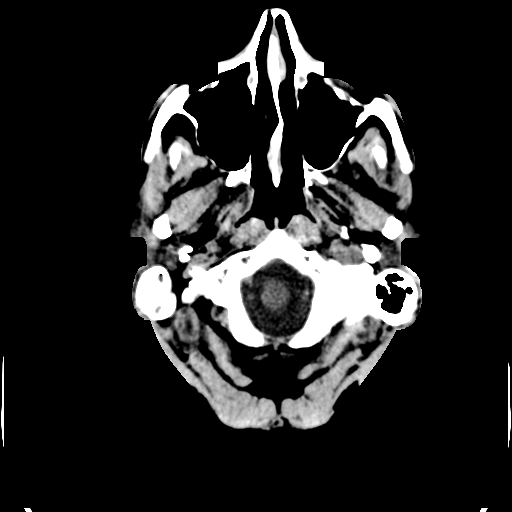
[im 7/36  brain]
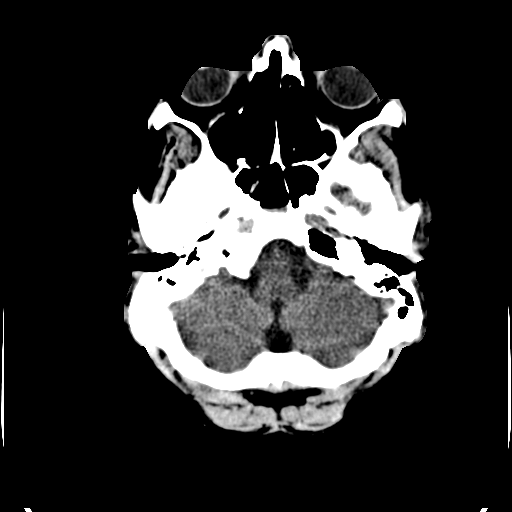
[im 9/36  brain]
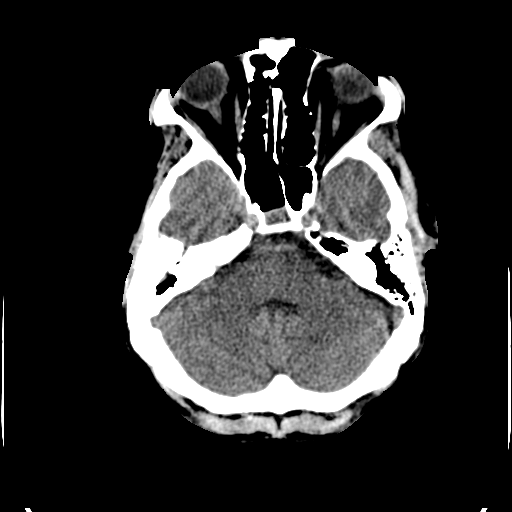
[im 11/36  brain]
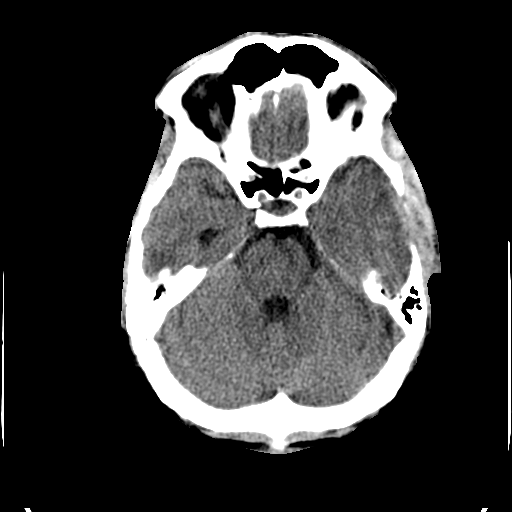
[im 11/36  bone]
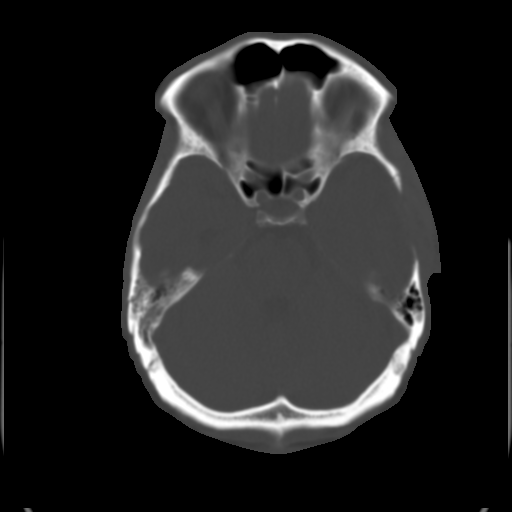
[im 14/36  brain]
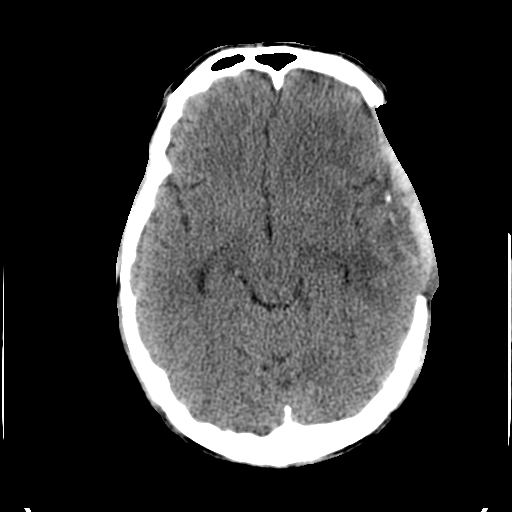
[im 16/36  brain]
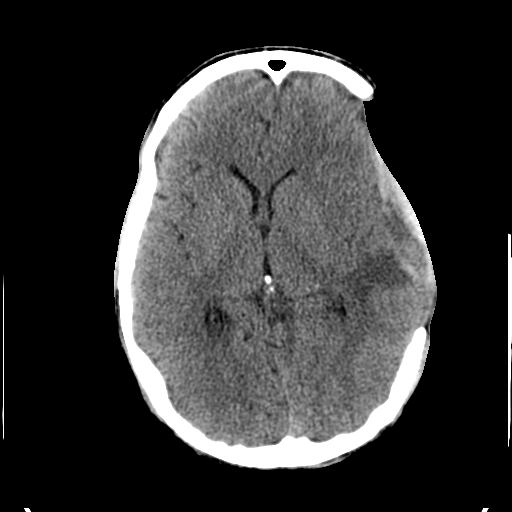
[im 19/36  brain]
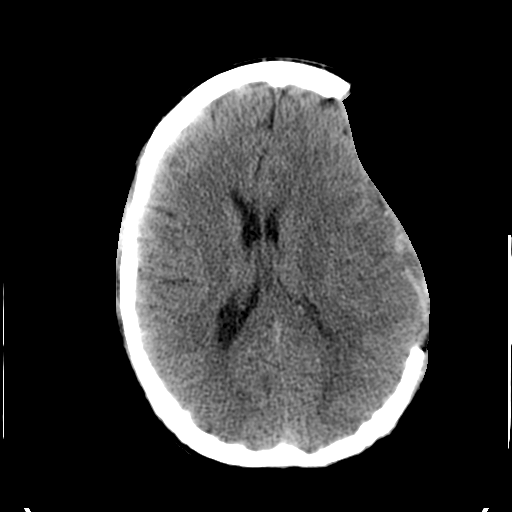
[im 20/36  brain]
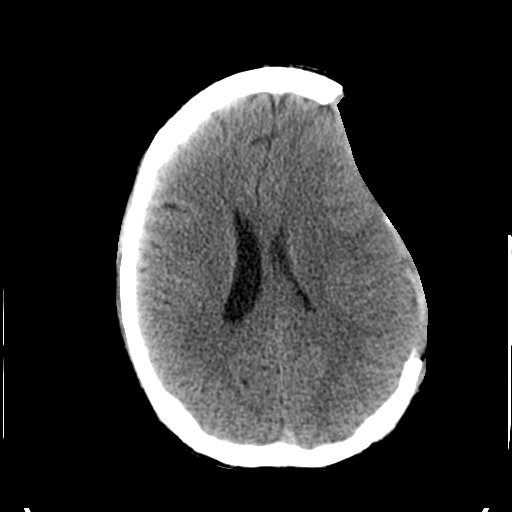
[im 20/36  bone]
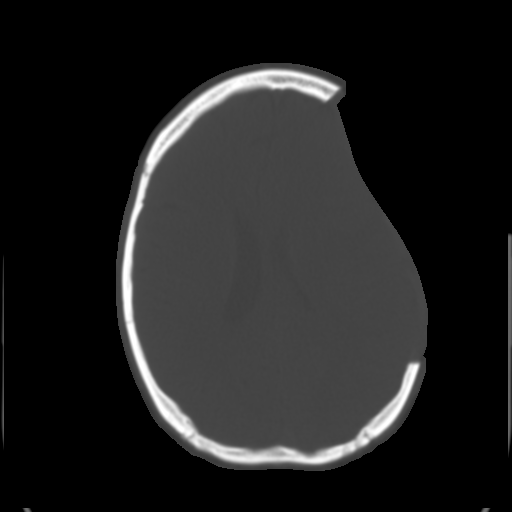
[im 22/36  brain]
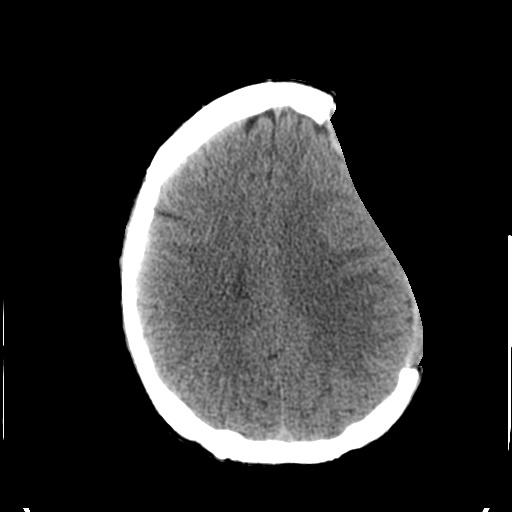
[im 25/36  brain]
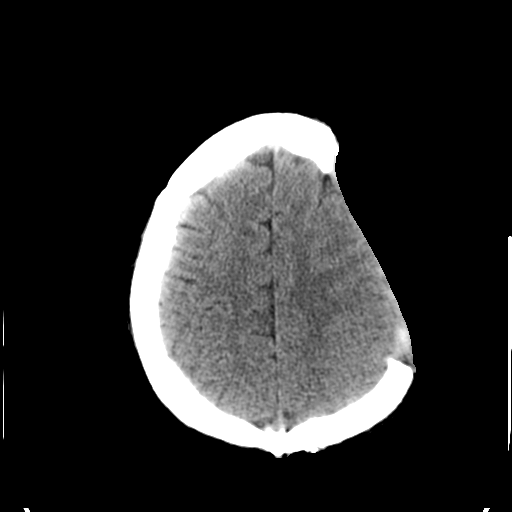
[im 27/36  brain]
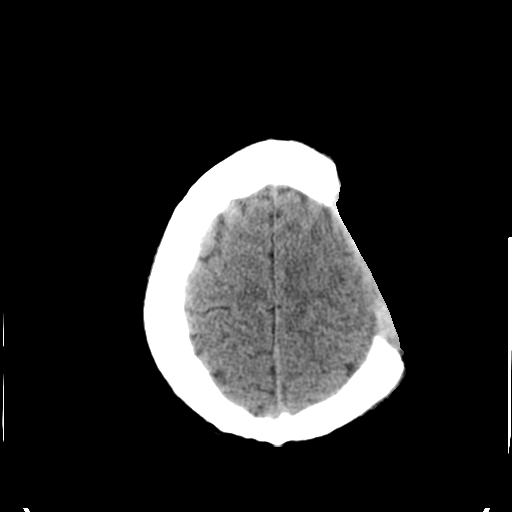
[im 29/36  brain]
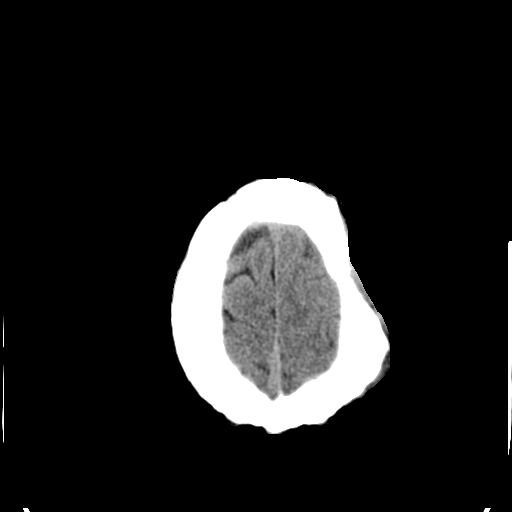
[im 29/36  bone]
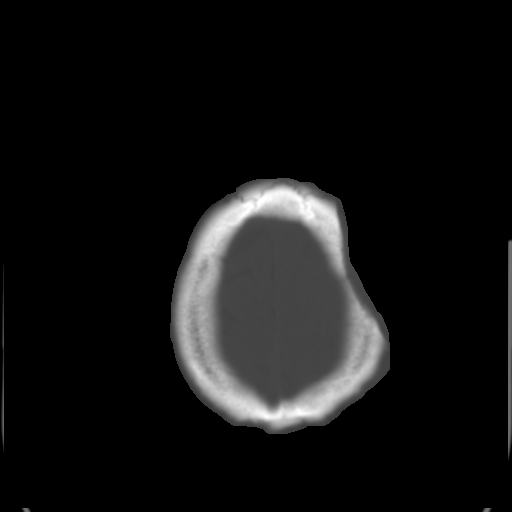
[im 32/36  brain]
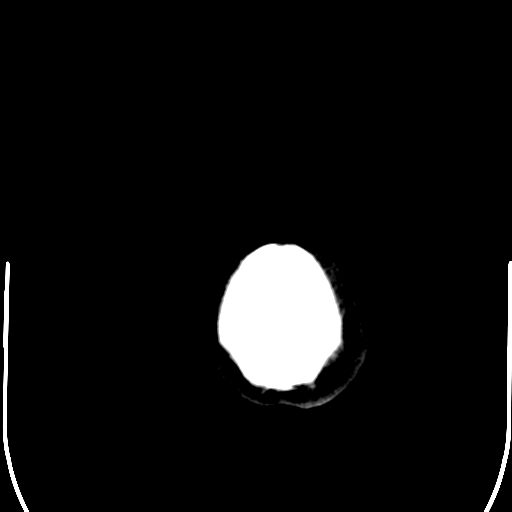
[im 34/36  brain]
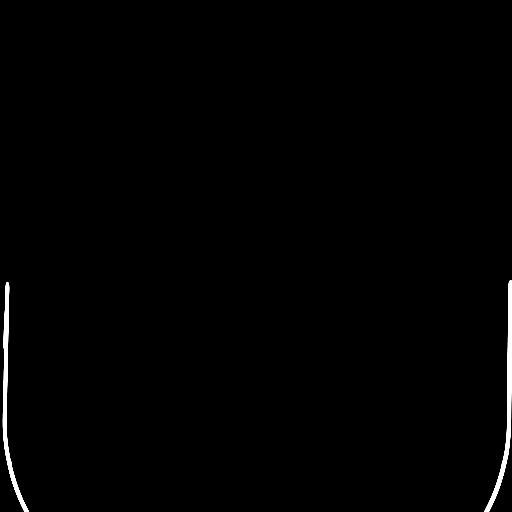

[15 of 30 positions shown; findings below may reference images not displayed]

FINDINGS: Large left craniotomy defect is again seen.  There has
been resolution of bilateral temporal intraparenchymal hemorrhage
since previous study.  Associated right temporal lobe edema has
resolved.  Mild left temporal lobe edema persists but is decreased.
No new intracranial hemorrhages seen.  A small subgaleal hematoma
is seen within the craniotomy defect which now shows decreased
attenuation and size compared to prior exam.

Sunken scalp is now noted across the large left craniotomy defect
and there is paradoxical left to right midline shift measuring 8
mm.  This is consistent with "sunken brain and scalp flap"
syndrome.
IMPRESSION: 1.  Resolution of bilateral temporal intraparenchymal hemorrhage
and decreased edema since prior exam.
2.  "Sunken brain and scalp flap" syndrome now demonstrated, with 8
mm of paradoxical left to right midline shift.

## 2011-10-06 ENCOUNTER — Emergency Department: Payer: Self-pay | Admitting: Emergency Medicine

## 2011-10-06 LAB — URINALYSIS, COMPLETE
Bilirubin,UR: NEGATIVE
Blood: NEGATIVE
Glucose,UR: NEGATIVE mg/dL (ref 0–75)
Leukocyte Esterase: NEGATIVE
Nitrite: NEGATIVE
RBC,UR: 6 /HPF (ref 0–5)
Squamous Epithelial: NONE SEEN
WBC UR: NONE SEEN /HPF (ref 0–5)

## 2011-10-06 LAB — COMPREHENSIVE METABOLIC PANEL
Albumin: 3.6 g/dL (ref 3.4–5.0)
Alkaline Phosphatase: 88 U/L (ref 50–136)
BUN: 8 mg/dL (ref 7–18)
Bilirubin,Total: 0.2 mg/dL (ref 0.2–1.0)
Calcium, Total: 8.4 mg/dL — ABNORMAL LOW (ref 8.5–10.1)
Chloride: 96 mmol/L — ABNORMAL LOW (ref 98–107)
Creatinine: 0.76 mg/dL (ref 0.60–1.30)
Glucose: 96 mg/dL (ref 65–99)
Osmolality: 265 (ref 275–301)
Potassium: 3.9 mmol/L (ref 3.5–5.1)
SGOT(AST): 17 U/L (ref 15–37)
SGPT (ALT): 17 U/L
Sodium: 133 mmol/L — ABNORMAL LOW (ref 136–145)

## 2011-10-06 LAB — CBC
HCT: 33.1 % — ABNORMAL LOW (ref 40.0–52.0)
MCH: 30.1 pg (ref 26.0–34.0)
Platelet: 274 10*3/uL (ref 150–440)
RBC: 3.92 10*6/uL — ABNORMAL LOW (ref 4.40–5.90)
WBC: 9.4 10*3/uL (ref 3.8–10.6)

## 2011-10-06 LAB — TROPONIN I: Troponin-I: <0.02 ng/mL

## 2011-10-06 LAB — DRUG SCREEN, URINE
Barbiturates, Ur Screen: NEGATIVE (ref ?–200)
Benzodiazepine, Ur Scrn: POSITIVE (ref ?–200)
Cannabinoid 50 Ng, Ur ~~LOC~~: POSITIVE (ref ?–50)
MDMA (Ecstasy)Ur Screen: NEGATIVE (ref ?–500)
Phencyclidine (PCP) Ur S: NEGATIVE (ref ?–25)

## 2011-10-06 LAB — CARBAMAZEPINE LEVEL, TOTAL: Carbamazepine: 7.2 ug/mL (ref 4.0–12.0)

## 2012-09-23 ENCOUNTER — Emergency Department: Payer: Self-pay | Admitting: Unknown Physician Specialty

## 2012-12-07 ENCOUNTER — Emergency Department: Payer: Self-pay | Admitting: Emergency Medicine

## 2012-12-07 LAB — COMPREHENSIVE METABOLIC PANEL
Albumin: 3.6 g/dL (ref 3.4–5.0)
Anion Gap: 3 — ABNORMAL LOW (ref 7–16)
Bilirubin,Total: 0.2 mg/dL (ref 0.2–1.0)
Calcium, Total: 9 mg/dL (ref 8.5–10.1)
Chloride: 96 mmol/L — ABNORMAL LOW (ref 98–107)
Co2: 30 mmol/L (ref 21–32)
Creatinine: 0.98 mg/dL (ref 0.60–1.30)
Glucose: 105 mg/dL — ABNORMAL HIGH (ref 65–99)
Osmolality: 258 (ref 275–301)
SGOT(AST): 22 U/L (ref 15–37)
SGPT (ALT): 22 U/L (ref 12–78)
Sodium: 129 mmol/L — ABNORMAL LOW (ref 136–145)

## 2012-12-07 LAB — URINALYSIS, COMPLETE
Blood: NEGATIVE
Ketone: NEGATIVE
Nitrite: NEGATIVE
Ph: 6 (ref 4.5–8.0)
Protein: NEGATIVE
Squamous Epithelial: <1

## 2012-12-07 LAB — CBC
HCT: 34.7 % — ABNORMAL LOW (ref 40.0–52.0)
HGB: 12.2 g/dL — ABNORMAL LOW (ref 13.0–18.0)
MCH: 29.6 pg (ref 26.0–34.0)
MCHC: 35.2 g/dL (ref 32.0–36.0)
MCV: 84 fL (ref 80–100)
Platelet: 332 10*3/uL (ref 150–440)
RBC: 4.12 10*6/uL — ABNORMAL LOW (ref 4.40–5.90)
RDW: 14.8 % — ABNORMAL HIGH (ref 11.5–14.5)

## 2012-12-27 ENCOUNTER — Emergency Department: Payer: Self-pay | Admitting: Emergency Medicine

## 2012-12-27 LAB — URINALYSIS, COMPLETE
Blood: NEGATIVE
Glucose,UR: NEGATIVE mg/dL (ref 0–75)
Leukocyte Esterase: NEGATIVE
Nitrite: NEGATIVE
Ph: 6 (ref 4.5–8.0)
Protein: NEGATIVE
RBC,UR: 8 /HPF (ref 0–5)
Specific Gravity: 1.029 (ref 1.003–1.030)
Squamous Epithelial: NONE SEEN
WBC UR: 1 /HPF (ref 0–5)

## 2012-12-27 LAB — CBC
MCH: 29.4 pg (ref 26.0–34.0)
MCHC: 34.3 g/dL (ref 32.0–36.0)
Platelet: 254 10*3/uL (ref 150–440)
RBC: 4.03 10*6/uL — ABNORMAL LOW (ref 4.40–5.90)
RDW: 14.7 % — ABNORMAL HIGH (ref 11.5–14.5)
WBC: 9.5 10*3/uL (ref 3.8–10.6)

## 2012-12-27 LAB — COMPREHENSIVE METABOLIC PANEL
Alkaline Phosphatase: 110 U/L (ref 50–136)
Anion Gap: 4 — ABNORMAL LOW (ref 7–16)
BUN: 8 mg/dL (ref 7–18)
Bilirubin,Total: 0.3 mg/dL (ref 0.2–1.0)
Calcium, Total: 8.7 mg/dL (ref 8.5–10.1)
Chloride: 90 mmol/L — ABNORMAL LOW (ref 98–107)
Co2: 30 mmol/L (ref 21–32)
Creatinine: 0.72 mg/dL (ref 0.60–1.30)
EGFR (African American): >60
Glucose: 94 mg/dL (ref 65–99)
Osmolality: 248 (ref 275–301)
Potassium: 4 mmol/L (ref 3.5–5.1)
SGOT(AST): 11 U/L — ABNORMAL LOW (ref 15–37)
Sodium: 124 mmol/L — ABNORMAL LOW (ref 136–145)
Total Protein: 7.2 g/dL (ref 6.4–8.2)

## 2013-04-03 ENCOUNTER — Encounter (HOSPITAL_COMMUNITY): Payer: Self-pay | Admitting: Emergency Medicine

## 2013-04-03 ENCOUNTER — Emergency Department (HOSPITAL_COMMUNITY)
Admission: EM | Admit: 2013-04-03 | Discharge: 2013-04-03 | Disposition: A | Discharge status: Home / Self Care | Payer: Medicare PPO | Attending: Emergency Medicine | Admitting: Emergency Medicine

## 2013-04-03 DIAGNOSIS — L089 Local infection of the skin and subcutaneous tissue, unspecified: Secondary | ICD-10-CM | POA: Insufficient documentation

## 2013-04-03 DIAGNOSIS — Z789 Other specified health status: Secondary | ICD-10-CM | POA: Insufficient documentation

## 2013-04-03 DIAGNOSIS — B958 Unspecified staphylococcus as the cause of diseases classified elsewhere: Secondary | ICD-10-CM

## 2013-04-03 DIAGNOSIS — Z8719 Personal history of other diseases of the digestive system: Secondary | ICD-10-CM | POA: Insufficient documentation

## 2013-04-03 DIAGNOSIS — F17200 Nicotine dependence, unspecified, uncomplicated: Secondary | ICD-10-CM | POA: Insufficient documentation

## 2013-04-03 DIAGNOSIS — G8929 Other chronic pain: Secondary | ICD-10-CM | POA: Insufficient documentation

## 2013-04-03 HISTORY — DX: Reserved for inherently not codable concepts without codable children: IMO0001

## 2013-04-03 HISTORY — DX: Gastro-esophageal reflux disease without esophagitis: K21.9

## 2013-04-03 HISTORY — DX: Other specified health status: Z78.9

## 2013-04-03 HISTORY — DX: Other chronic pain: G89.29

## 2013-04-03 MED ORDER — SULFAMETHOXAZOLE-TRIMETHOPRIM 800-160 MG PO TABS: 1.0000 | ORAL_TABLET | Freq: Two times a day (BID) | ORAL | Status: DC

## 2013-04-03 MED ORDER — MUPIROCIN 2 % EX OINT: TOPICAL_OINTMENT | CUTANEOUS | Status: DC

## 2013-04-03 NOTE — ED Provider Notes (Signed)
  Medical screening examination/treatment/procedure(s) were performed by non-physician practitioner and as supervising physician I was immediately available for consultation/collaboration.      Carmin Muskrat, MD 04/03/13 [phone]

## 2013-04-03 NOTE — ED Notes (Signed)
Pt states rash/ abscess to buttock. First noticed a couple of months ago.

## 2013-04-03 NOTE — ED Notes (Signed)
Pt presents with abscess-like area to buttock which he states he first noticed one month ago. Pt reports clear drainage from area. Area is tender to touch.

## 2013-04-03 NOTE — ED Provider Notes (Signed)
CSN: [id number]     Arrival date & time 04/03/13  0744 History   First MD Initiated Contact with Patient 04/03/13 [phone]     Chief Complaint  Patient presents with  . Rash   (Consider location/radiation/quality/duration/timing/severity/associated sxs/prior Treatment) HPI Comments: [name] is a 43 y.o. male who presents to the Emergency Department complaining of  Multiple skin lesions to the buttocks, left thigh and lower leg.  He states the lesions have been present for 2 months. His symptoms appear to be waxing and waning. He states the lesion on his left buttocks has been draining clear fluid  But has recently scabbed over. He denies fever, chills, nausea or vomiting. Pain is worse with palpation and sitting. Nothing seems to make his symptoms better. Patient has multiple tattoos, but denies any new tattoos or piercings.  Patient is a 43 y.o. male presenting with rash. The history is provided by the patient.  Rash Associated symptoms: no abdominal pain, no fever, no headaches, no joint pain, no nausea and not vomiting     Past Medical History  Diagnosis Date  . Chronic pain   . Poor historian   . Reflux    Past Surgical History  Procedure Laterality Date  . Brain surgery    . Abdominal surgery     No family history on file. History  Substance Use Topics  . Smoking status: Current Every Day Smoker  . Smokeless tobacco: Not on file  . Alcohol Use: Yes     Comment: Occ    Review of Systems  Constitutional: Negative for fever and chills.  Gastrointestinal: Negative for nausea, vomiting and abdominal pain.  Genitourinary: Negative for dysuria.  Musculoskeletal: Negative for arthralgias and joint swelling.  Skin: Positive for color change. Negative for rash.       Multiple skin lesions   Neurological: Negative for dizziness, seizures, numbness and headaches.  Hematological: Negative for adenopathy.  All other systems reviewed and are negative.    Allergies  Review  of patient's allergies indicates no known allergies.  Home Medications   Current Outpatient Rx  Name  Route  Sig  Dispense  Refill  . mupirocin ointment (BACTROBAN) 2 %      Apply to the affected areas TID x 10 days   30 g   0   . sulfamethoxazole-trimethoprim (SEPTRA DS) 800-160 MG per tablet   Oral   Take 1 tablet by mouth 2 (two) times daily.   28 tablet   0    BP 157/103  Pulse 77  Temp(Src) 98.8 F (37.1 C) (Oral)  Resp 16  Ht 6' (1.829 m)  SpO2 100% Physical Exam  Nursing note and vitals reviewed. Constitutional: He is oriented to person, place, and time. He appears well-developed and well-nourished. No distress.  HENT:  Head: Normocephalic and atraumatic.  Neck: Normal range of motion. Neck supple.  Cardiovascular: Normal rate, regular rhythm, normal heart sounds and intact distal pulses.   No murmur heard. Pulmonary/Chest: Effort normal and breath sounds normal. No respiratory distress.  Abdominal: Soft.  Musculoskeletal: Normal range of motion. He exhibits no edema.  Lymphadenopathy:    He has no cervical adenopathy.  Neurological: He is alert and oriented to person, place, and time. He exhibits normal muscle tone. Coordination normal.  Skin: Skin is warm.  Multiple small ecthymatouss papules to the left buttocks, anterior left thigh and lower leg.  One dime sized lesion to the left buttock that appears crusted.  No edema, surrounding  erythema, induration or fluctuance.      ED Course  Procedures (including critical care time) Labs Review Labs Reviewed - No data to display Imaging Review No results found.  EKG Interpretation   None       MDM   1. Staph skin infection    Patient is well appearing with recurrent , multiple small lesions to the buttock and LLE.  He has multiple tattoos, but denies recent tattoos.  Likely MRSA vs impetigo.  No drainable abscess. Will prescribe bactrim and Bactroban ointment.  He agrees to close f/u with his PMD or to  return here if needed.    VSS.  He appears stable for discharge.   Aracelia Brinson L. Vanessa Somerset, PA-C 04/03/13 1517

## 2013-05-30 ENCOUNTER — Encounter (HOSPITAL_COMMUNITY): Payer: Self-pay | Admitting: Emergency Medicine

## 2013-05-30 ENCOUNTER — Emergency Department (HOSPITAL_COMMUNITY)
Admission: EM | Admit: 2013-05-30 | Discharge: 2013-05-30 | Discharge status: Against Medical Advice | Payer: Medicare PPO | Attending: Emergency Medicine | Admitting: Emergency Medicine

## 2013-05-30 DIAGNOSIS — Y929 Unspecified place or not applicable: Secondary | ICD-10-CM | POA: Insufficient documentation

## 2013-05-30 DIAGNOSIS — G8929 Other chronic pain: Secondary | ICD-10-CM | POA: Insufficient documentation

## 2013-05-30 DIAGNOSIS — Y939 Activity, unspecified: Secondary | ICD-10-CM | POA: Insufficient documentation

## 2013-05-30 DIAGNOSIS — S0990XA Unspecified injury of head, initial encounter: Secondary | ICD-10-CM | POA: Insufficient documentation

## 2013-05-30 DIAGNOSIS — W1809XA Striking against other object with subsequent fall, initial encounter: Secondary | ICD-10-CM | POA: Insufficient documentation

## 2013-05-30 DIAGNOSIS — F17200 Nicotine dependence, unspecified, uncomplicated: Secondary | ICD-10-CM | POA: Insufficient documentation

## 2013-05-30 LAB — COMPREHENSIVE METABOLIC PANEL
ALK PHOS: 84 U/L (ref 39–117)
ALT: 21 U/L (ref 0–53)
AST: 29 U/L (ref 0–37)
Albumin: 3.8 g/dL (ref 3.5–5.2)
BUN: 5 mg/dL — ABNORMAL LOW (ref 6–23)
CHLORIDE: 99 meq/L (ref 96–112)
CO2: 31 meq/L (ref 19–32)
Calcium: 9.3 mg/dL (ref 8.4–10.5)
Creatinine, Ser: 0.71 mg/dL (ref 0.50–1.35)
GFR calc Af Amer: >90 mL/min (ref >90)
Glucose, Bld: 107 mg/dL — ABNORMAL HIGH (ref 70–99)
Potassium: 4.9 mEq/L (ref 3.7–5.3)
SODIUM: 139 meq/L (ref 137–147)
Total Protein: 7.5 g/dL (ref 6.0–8.3)

## 2013-05-30 LAB — CBC WITH DIFFERENTIAL/PLATELET
BASOS ABS: 0 10*3/uL (ref 0.0–0.1)
Basophils Relative: 0 % (ref 0–1)
Eosinophils Absolute: 0.3 10*3/uL (ref 0.0–0.7)
Eosinophils Relative: 4 % (ref 0–5)
HEMATOCRIT: 35.8 % — AB (ref 39.0–52.0)
Hemoglobin: 12.2 g/dL — ABNORMAL LOW (ref 13.0–17.0)
LYMPHS PCT: 22 % (ref 12–46)
Lymphs Abs: 1.8 10*3/uL (ref 0.7–4.0)
MCH: 29.1 pg (ref 26.0–34.0)
MCHC: 34.1 g/dL (ref 30.0–36.0)
MCV: 85.4 fL (ref 78.0–100.0)
MONO ABS: 0.6 10*3/uL (ref 0.1–1.0)
Monocytes Relative: 7 % (ref 3–12)
NEUTROS ABS: 5.2 10*3/uL (ref 1.7–7.7)
Neutrophils Relative %: 66 % (ref 43–77)
PLATELETS: 312 10*3/uL (ref 150–400)
RBC: 4.19 MIL/uL — ABNORMAL LOW (ref 4.22–5.81)
RDW: 14.8 % (ref 11.5–15.5)
WBC: 7.9 10*3/uL (ref 4.0–10.5)
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
## 2013-09-17 ENCOUNTER — Encounter (HOSPITAL_COMMUNITY): Payer: Self-pay | Admitting: Emergency Medicine

## 2013-09-17 ENCOUNTER — Emergency Department (HOSPITAL_COMMUNITY)
Admission: EM | Admit: 2013-09-17 | Discharge: 2013-09-17 | Disposition: A | Discharge status: Home / Self Care | Payer: Medicare PPO | Attending: Emergency Medicine | Admitting: Emergency Medicine

## 2013-09-17 ENCOUNTER — Emergency Department (HOSPITAL_COMMUNITY): Payer: Medicare PPO

## 2013-09-17 DIAGNOSIS — Z87828 Personal history of other (healed) physical injury and trauma: Secondary | ICD-10-CM | POA: Insufficient documentation

## 2013-09-17 DIAGNOSIS — Z79899 Other long term (current) drug therapy: Secondary | ICD-10-CM | POA: Insufficient documentation

## 2013-09-17 DIAGNOSIS — K219 Gastro-esophageal reflux disease without esophagitis: Secondary | ICD-10-CM | POA: Insufficient documentation

## 2013-09-17 DIAGNOSIS — G8929 Other chronic pain: Secondary | ICD-10-CM | POA: Insufficient documentation

## 2013-09-17 DIAGNOSIS — F17200 Nicotine dependence, unspecified, uncomplicated: Secondary | ICD-10-CM | POA: Insufficient documentation

## 2013-09-17 DIAGNOSIS — R569 Unspecified convulsions: Secondary | ICD-10-CM

## 2013-09-17 HISTORY — DX: Unspecified injury of head, initial encounter: S09.90XA

## 2013-09-17 LAB — CBC
HEMATOCRIT: 35.8 % — AB (ref 39.0–52.0)
Hemoglobin: 12.4 g/dL — ABNORMAL LOW (ref 13.0–17.0)
MCH: 29.5 pg (ref 26.0–34.0)
MCHC: 34.6 g/dL (ref 30.0–36.0)
MCV: 85.2 fL (ref 78.0–100.0)
Platelets: 258 10*3/uL (ref 150–400)
RBC: 4.2 MIL/uL — ABNORMAL LOW (ref 4.22–5.81)
RDW: 13.7 % (ref 11.5–15.5)
WBC: 12.3 10*3/uL — ABNORMAL HIGH (ref 4.0–10.5)

## 2013-09-17 LAB — BASIC METABOLIC PANEL
Anion gap: 12 (ref 5–15)
BUN: 6 mg/dL (ref 6–23)
CALCIUM: 8.8 mg/dL (ref 8.4–10.5)
CO2: 27 meq/L (ref 19–32)
CREATININE: 0.73 mg/dL (ref 0.50–1.35)
Chloride: 93 mEq/L — ABNORMAL LOW (ref 96–112)
GFR calc Af Amer: >90 mL/min (ref >90)
GFR calc non Af Amer: >90 mL/min (ref >90)
GLUCOSE: 103 mg/dL — AB (ref 70–99)
Potassium: 4.5 mEq/L (ref 3.7–5.3)
Sodium: 132 mEq/L — ABNORMAL LOW (ref 137–147)

## 2013-09-17 LAB — CBG MONITORING, ED: Glucose-Capillary: 114 mg/dL — ABNORMAL HIGH (ref 70–99)

## 2013-09-17 LAB — ETHANOL: Alcohol, Ethyl (B): <11 mg/dL (ref 0–11)

## 2013-09-17 LAB — RAPID URINE DRUG SCREEN, HOSP PERFORMED
Amphetamines: NOT DETECTED
BARBITURATES: NOT DETECTED
Benzodiazepines: NOT DETECTED
Cocaine: NOT DETECTED
Opiates: POSITIVE — AB
Tetrahydrocannabinol: POSITIVE — AB

## 2013-09-17 MED ORDER — ACETAMINOPHEN 325 MG PO TABS
650.0000 mg | ORAL_TABLET | Freq: Once | ORAL | Status: AC
  Administered 2013-09-17: 650 mg via ORAL
  Filled 2013-09-17: qty 2

## 2013-09-17 MED ORDER — LEVETIRACETAM IN NACL 1000 MG/100ML IV SOLN
1000.0000 mg | INTRAVENOUS | Status: AC
  Administered 2013-09-17: 1000 mg via INTRAVENOUS
  Filled 2013-09-17: qty 100

## 2013-09-17 MED ORDER — IBUPROFEN 800 MG PO TABS
800.0000 mg | ORAL_TABLET | Freq: Once | ORAL | Status: AC
  Administered 2013-09-17: 800 mg via ORAL
  Filled 2013-09-17: qty 1

## 2013-09-17 MED ORDER — LEVETIRACETAM 500 MG PO TABS: 500.0000 mg | ORAL_TABLET | Freq: Two times a day (BID) | ORAL | Status: DC
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
## 2013-09-17 NOTE — Discharge Instructions (Signed)
Please follow up with your primary care physician in 1-2 days. If you do not have one please call the [facility] number listed above. Please call one of the neurology offices above to schedule a followup appointment. Please take the Keppra as prescribed. Please do not drive until you have been seizure free for six months. Please read all discharge instructions and return precautions.    Epilepsy People with epilepsy have times when they shake and jerk uncontrollably (seizures). This happens when there is a sudden change in brain function. Epilepsy may have many possible causes. Anything that disturbs the normal pattern of brain cell activity can lead to seizures. HOME CARE   Follow your doctor's instructions about driving and safety during normal activities.  Get enough sleep.  Only take medicine as told by your doctor.  Avoid things that you know can cause you to have seizures (triggers).  Write down when your seizures happen and what you remember about each seizure. Write down anything you think may have caused the seizure to happen.  Tell the people you live and work with that you have seizures. Make sure they know how to help you. They should:  Cushion your head and body.  Turn you on your side.  Not restrain you.  Not place anything inside your mouth.  Call for local emergency medical help if there is any question about what has happened.  Keep all follow-up visits with your doctor. This is very important. GET HELP IF:  You get an infection or start to feel sick. You may have more seizures when you are sick.  You are having seizures more often.  Your seizure pattern is changing. GET HELP RIGHT AWAY IF:   A seizure does not stop after a few seconds or minutes.  A seizure causes you to have trouble breathing.  A seizure gives you a very bad headache.  A seizure makes you unable to speak or use a part of your body. Document Released: 12/26/2008  Document Revised: 12/19/2012 Document Reviewed: 10/10/2012 [facility] Patient Information 2015 [place], [place]. This information is not intended to replace advice given to you by your health care provider. Make sure you discuss any questions you have with your health care provider.  Driving and Equipment Restrictions Some medical problems make it dangerous to drive, ride a bike, or use machines. Some of these problems are:  A hard blow to the head (concussion).  Passing out (fainting).  Twitching and shaking (seizures).  Low blood sugar.  Taking medicine to help you relax (sedatives).  Taking pain medicines.  Wearing an eye patch.  Wearing splints. This can make it hard to use parts of your body that you need to drive safely. HOME CARE   Do not drive until your doctor says it is okay.  Do not use machines until your doctor says it is okay. You may need a form signed by your doctor (medical release) before you can drive again. You may also need this form before you do other tasks where you need to be fully alert. MAKE SURE YOU:  Understand these instructions.  Will watch your condition.  Will get help right away if you are not doing well or get worse. Document Released: 04/07/2004 Document Revised: 05/23/2011 Document Reviewed: 07/08/2009 [facility] Patient Information 2015 [place], [place]. This information is not intended to replace advice given to you by your health care provider. Make sure you discuss any questions you have with your health care provider.

## 2013-09-17 NOTE — ED Notes (Signed)
Pt escorted out in wheelchair.

## 2013-09-18 NOTE — ED Provider Notes (Signed)
Medical screening examination/treatment/procedure(s) were performed by non-physician practitioner and as supervising physician I was immediately available for consultation/collaboration.   EKG Interpretation   Date/Time:  Tuesday September 17 2013 19:13:28 EDT Ventricular Rate:  71 PR Interval:  131 QRS Duration: 106 QT Interval:  405 QTC Calculation: 440 R Axis:   83 Text Interpretation:  Sinus rhythm ST elev, probable normal early repol  pattern No significant change was found Confirmed by Wyvonnia Dusky  MD, Kimberlye Dilger  [phone]) on 09/17/2013 7:17:15 PM       Ezequiel Essex, MD 09/18/13 [phone]

## 2013-09-27 ENCOUNTER — Ambulatory Visit: Payer: Medicare PPO | Admitting: Neurology

## 2014-08-19 ENCOUNTER — Other Ambulatory Visit: Payer: Self-pay | Admitting: Neurosurgery

## 2014-08-19 DIAGNOSIS — G44329 Chronic post-traumatic headache, not intractable: Secondary | ICD-10-CM

## 2014-08-27 ENCOUNTER — Ambulatory Visit
Admission: RE | Admit: 2014-08-27 | Discharge: 2014-08-27 | Disposition: A | Discharge status: Home / Self Care | Payer: Medicare PPO | Source: Ambulatory Visit | Attending: Neurosurgery | Admitting: Neurosurgery

## 2014-08-27 DIAGNOSIS — G44329 Chronic post-traumatic headache, not intractable: Secondary | ICD-10-CM

## 2014-09-17 ENCOUNTER — Encounter (HOSPITAL_COMMUNITY): Payer: Self-pay | Admitting: Emergency Medicine

## 2014-09-17 ENCOUNTER — Emergency Department (HOSPITAL_COMMUNITY)
Admission: EM | Admit: 2014-09-17 | Discharge: 2014-09-17 | Disposition: A | Discharge status: Home / Self Care | Payer: Medicare PPO | Attending: Emergency Medicine | Admitting: Emergency Medicine

## 2014-09-17 DIAGNOSIS — K529 Noninfective gastroenteritis and colitis, unspecified: Secondary | ICD-10-CM | POA: Diagnosis not present

## 2014-09-17 DIAGNOSIS — Z79899 Other long term (current) drug therapy: Secondary | ICD-10-CM | POA: Insufficient documentation

## 2014-09-17 DIAGNOSIS — Z87820 Personal history of traumatic brain injury: Secondary | ICD-10-CM | POA: Insufficient documentation

## 2014-09-17 DIAGNOSIS — R519 Headache, unspecified: Secondary | ICD-10-CM

## 2014-09-17 DIAGNOSIS — R51 Headache: Secondary | ICD-10-CM | POA: Insufficient documentation

## 2014-09-17 DIAGNOSIS — Z720 Tobacco use: Secondary | ICD-10-CM | POA: Diagnosis not present

## 2014-09-17 LAB — CBC WITH DIFFERENTIAL/PLATELET
BASOS ABS: 0 10*3/uL (ref 0.0–0.1)
BASOS PCT: 0 % (ref 0–1)
EOS ABS: 0.2 10*3/uL (ref 0.0–0.7)
Eosinophils Relative: 3 % (ref 0–5)
HCT: 34.3 % — ABNORMAL LOW (ref 39.0–52.0)
Hemoglobin: 12 g/dL — ABNORMAL LOW (ref 13.0–17.0)
Lymphocytes Relative: 21 % (ref 12–46)
Lymphs Abs: 1.4 10*3/uL (ref 0.7–4.0)
MCH: 28.6 pg (ref 26.0–34.0)
MCHC: 35 g/dL (ref 30.0–36.0)
MCV: 81.9 fL (ref 78.0–100.0)
Monocytes Absolute: 0.4 10*3/uL (ref 0.1–1.0)
Monocytes Relative: 7 % (ref 3–12)
NEUTROS PCT: 69 % (ref 43–77)
Neutro Abs: 4.4 10*3/uL (ref 1.7–7.7)
Platelets: 267 10*3/uL (ref 150–400)
RBC: 4.19 MIL/uL — ABNORMAL LOW (ref 4.22–5.81)
RDW: 13.5 % (ref 11.5–15.5)
WBC: 6.4 10*3/uL (ref 4.0–10.5)

## 2014-09-17 LAB — BASIC METABOLIC PANEL
ANION GAP: 9 (ref 5–15)
BUN: 6 mg/dL (ref 6–20)
CALCIUM: 8.6 mg/dL — AB (ref 8.9–10.3)
CO2: 29 mmol/L (ref 22–32)
Chloride: 90 mmol/L — ABNORMAL LOW (ref 101–111)
Creatinine, Ser: 0.66 mg/dL (ref 0.61–1.24)
GFR calc Af Amer: >60 mL/min (ref >60)
Glucose, Bld: 117 mg/dL — ABNORMAL HIGH (ref 65–99)
Potassium: 4.3 mmol/L (ref 3.5–5.1)
Sodium: 128 mmol/L — ABNORMAL LOW (ref 135–145)

## 2014-09-17 MED ORDER — DIPHENHYDRAMINE HCL 50 MG/ML IJ SOLN
25.0000 mg | Freq: Once | INTRAMUSCULAR | Status: AC
  Administered 2014-09-17: 25 mg via INTRAVENOUS
  Filled 2014-09-17: qty 1

## 2014-09-17 MED ORDER — LORAZEPAM 2 MG/ML IJ SOLN
0.5000 mg | Freq: Once | INTRAMUSCULAR | Status: AC
  Administered 2014-09-17: 0.5 mg via INTRAVENOUS

## 2014-09-17 MED ORDER — METOCLOPRAMIDE HCL 5 MG/ML IJ SOLN
10.0000 mg | Freq: Once | INTRAMUSCULAR | Status: AC
  Administered 2014-09-17: 10 mg via INTRAVENOUS
  Filled 2014-09-17: qty 2

## 2014-09-17 MED ORDER — KETOROLAC TROMETHAMINE 30 MG/ML IJ SOLN
30.0000 mg | Freq: Once | INTRAMUSCULAR | Status: AC
  Administered 2014-09-17: 30 mg via INTRAVENOUS
  Filled 2014-09-17: qty 1

## 2014-09-17 MED ORDER — PROMETHAZINE HCL 25 MG PO TABS: 25.0000 mg | ORAL_TABLET | Freq: Four times a day (QID) | ORAL | Status: DC | PRN

## 2014-09-17 MED ORDER — SODIUM CHLORIDE 0.9 % IV BOLUS (SEPSIS)
1000.0000 mL | Freq: Once | INTRAVENOUS | Status: AC
Start: 2014-09-17 — End: 2014-09-17
  Administered 2014-09-17: 1000 mL via INTRAVENOUS

## 2014-09-17 MED ORDER — LORAZEPAM 2 MG/ML IJ SOLN
INTRAMUSCULAR | Status: AC
  Filled 2014-09-17: qty 1

## 2014-09-17 NOTE — ED Notes (Signed)
Pt mother reports pt has history of abnormal sodium levels. Pt mother reports headache,emesis since yesterday.

## 2014-09-17 NOTE — ED Notes (Signed)
Patient with c/o anxiety and restlessness. Mild restlessness noted. MD notified and 0.5 mg Ativan IV verbal order obtained.

## 2014-09-17 NOTE — Discharge Instructions (Signed)
Medication for nausea. Increase fluids. Follow-up your primary care doctor.

## 2014-09-17 NOTE — ED Notes (Signed)
Pt complaining of headache all day per mother, reports low sodium levels, he has been drinking gatorade as directed, Mother was told if pt has any changes to bring to ED.
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
## 2014-11-21 ENCOUNTER — Encounter (HOSPITAL_COMMUNITY): Payer: Self-pay | Admitting: Emergency Medicine

## 2015-01-09 DIAGNOSIS — G4700 Insomnia, unspecified: Secondary | ICD-10-CM | POA: Insufficient documentation

## 2015-02-18 ENCOUNTER — Encounter: Payer: Self-pay | Admitting: Emergency Medicine

## 2015-02-18 ENCOUNTER — Emergency Department
Admission: EM | Admit: 2015-02-18 | Discharge: 2015-02-18 | Disposition: A | Discharge status: Home / Self Care | Payer: Medicare PPO | Attending: Emergency Medicine | Admitting: Emergency Medicine

## 2015-02-18 DIAGNOSIS — Z79899 Other long term (current) drug therapy: Secondary | ICD-10-CM | POA: Diagnosis not present

## 2015-02-18 DIAGNOSIS — L0201 Cutaneous abscess of face: Secondary | ICD-10-CM | POA: Insufficient documentation

## 2015-02-18 DIAGNOSIS — F17210 Nicotine dependence, cigarettes, uncomplicated: Secondary | ICD-10-CM | POA: Diagnosis not present

## 2015-02-18 MED ORDER — SULFAMETHOXAZOLE-TRIMETHOPRIM 800-160 MG PO TABS: 1.0000 | ORAL_TABLET | Freq: Two times a day (BID) | ORAL | Status: DC

## 2015-02-18 MED ORDER — OXYCODONE HCL 10 MG PO TABS: 10.0000 mg | ORAL_TABLET | Freq: Four times a day (QID) | ORAL | Status: DC | PRN

## 2015-02-18 NOTE — Discharge Instructions (Signed)
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
## 2015-02-18 NOTE — ED Notes (Signed)
Pt to ed with abcess to left side of face for two days.

## 2015-02-23 ENCOUNTER — Inpatient Hospital Stay
Admission: EM | Admit: 2015-02-23 | Discharge: 2015-02-25 | DRG: 641 | Disposition: A | Discharge status: Home / Self Care | Payer: Medicare PPO | Attending: Internal Medicine | Admitting: Internal Medicine

## 2015-02-23 ENCOUNTER — Emergency Department: Payer: Medicare PPO

## 2015-02-23 ENCOUNTER — Encounter: Payer: Self-pay | Admitting: Urgent Care

## 2015-02-23 DIAGNOSIS — E871 Hypo-osmolality and hyponatremia: Principal | ICD-10-CM | POA: Diagnosis present

## 2015-02-23 DIAGNOSIS — R197 Diarrhea, unspecified: Secondary | ICD-10-CM | POA: Diagnosis present

## 2015-02-23 DIAGNOSIS — R631 Polydipsia: Secondary | ICD-10-CM | POA: Diagnosis present

## 2015-02-23 DIAGNOSIS — Z79899 Other long term (current) drug therapy: Secondary | ICD-10-CM

## 2015-02-23 DIAGNOSIS — K219 Gastro-esophageal reflux disease without esophagitis: Secondary | ICD-10-CM | POA: Diagnosis present

## 2015-02-23 DIAGNOSIS — G40909 Epilepsy, unspecified, not intractable, without status epilepticus: Secondary | ICD-10-CM | POA: Diagnosis present

## 2015-02-23 DIAGNOSIS — L0201 Cutaneous abscess of face: Secondary | ICD-10-CM | POA: Diagnosis present

## 2015-02-23 DIAGNOSIS — F17210 Nicotine dependence, cigarettes, uncomplicated: Secondary | ICD-10-CM | POA: Diagnosis present

## 2015-02-23 DIAGNOSIS — R140 Abdominal distension (gaseous): Secondary | ICD-10-CM | POA: Diagnosis present

## 2015-02-23 DIAGNOSIS — G8929 Other chronic pain: Secondary | ICD-10-CM | POA: Diagnosis present

## 2015-02-23 DIAGNOSIS — K5900 Constipation, unspecified: Secondary | ICD-10-CM | POA: Diagnosis present

## 2015-02-23 DIAGNOSIS — Z87820 Personal history of traumatic brain injury: Secondary | ICD-10-CM

## 2015-02-23 DIAGNOSIS — Z818 Family history of other mental and behavioral disorders: Secondary | ICD-10-CM

## 2015-02-23 DIAGNOSIS — Z809 Family history of malignant neoplasm, unspecified: Secondary | ICD-10-CM

## 2015-02-23 DIAGNOSIS — I10 Essential (primary) hypertension: Secondary | ICD-10-CM | POA: Diagnosis present

## 2015-02-23 DIAGNOSIS — R1900 Intra-abdominal and pelvic swelling, mass and lump, unspecified site: Secondary | ICD-10-CM | POA: Diagnosis present

## 2015-02-23 DIAGNOSIS — Z9889 Other specified postprocedural states: Secondary | ICD-10-CM

## 2015-02-23 HISTORY — DX: Essential (primary) hypertension: I10

## 2015-02-23 LAB — CBC WITH DIFFERENTIAL/PLATELET
BASOS ABS: 0.1 10*3/uL (ref 0–0.1)
Basophils Relative: 1 %
EOS ABS: 0.3 10*3/uL (ref 0–0.7)
Eosinophils Relative: 6 %
HCT: 37.3 % — ABNORMAL LOW (ref 40.0–52.0)
Hemoglobin: 12.9 g/dL — ABNORMAL LOW (ref 13.0–18.0)
LYMPHS PCT: 43 %
Lymphs Abs: 2.6 10*3/uL (ref 1.0–3.6)
MCH: 28.7 pg (ref 26.0–34.0)
MCHC: 34.6 g/dL (ref 32.0–36.0)
MCV: 82.9 fL (ref 80.0–100.0)
MONO ABS: 0.5 10*3/uL (ref 0.2–1.0)
Monocytes Relative: 9 %
Neutro Abs: 2.4 10*3/uL (ref 1.4–6.5)
Neutrophils Relative %: 41 %
PLATELETS: 240 10*3/uL (ref 150–440)
RBC: 4.51 MIL/uL (ref 4.40–5.90)
RDW: 14.5 % (ref 11.5–14.5)
WBC: 5.9 10*3/uL (ref 3.8–10.6)

## 2015-02-23 NOTE — ED Notes (Addendum)
Patient presents with reports of abdominal swelling x 4 days. LNBM was over 4-5 days ago. Patient reports that whatever he eats comes back up. "I cant even swallow spit... It comes right back up." (+) nausea. Of note, patient on narcotic pain medications for chronic pain and for recent ED visit where he was Dx'd with "painful cysts".

## 2015-02-23 NOTE — ED Notes (Signed)
Patient transported from X-ray to room 19, await MD eval.

## 2015-02-23 NOTE — ED Notes (Signed)
Patient transported to X-ray 

## 2015-02-24 ENCOUNTER — Encounter: Payer: Self-pay | Admitting: Radiology

## 2015-02-24 ENCOUNTER — Emergency Department: Payer: Medicare PPO

## 2015-02-24 DIAGNOSIS — R631 Polydipsia: Secondary | ICD-10-CM | POA: Diagnosis present

## 2015-02-24 DIAGNOSIS — Z9889 Other specified postprocedural states: Secondary | ICD-10-CM | POA: Diagnosis not present

## 2015-02-24 DIAGNOSIS — L0201 Cutaneous abscess of face: Secondary | ICD-10-CM | POA: Diagnosis present

## 2015-02-24 DIAGNOSIS — E871 Hypo-osmolality and hyponatremia: Secondary | ICD-10-CM | POA: Diagnosis present

## 2015-02-24 DIAGNOSIS — Z87820 Personal history of traumatic brain injury: Secondary | ICD-10-CM | POA: Diagnosis not present

## 2015-02-24 DIAGNOSIS — Z79899 Other long term (current) drug therapy: Secondary | ICD-10-CM | POA: Diagnosis not present

## 2015-02-24 DIAGNOSIS — F17210 Nicotine dependence, cigarettes, uncomplicated: Secondary | ICD-10-CM | POA: Diagnosis present

## 2015-02-24 DIAGNOSIS — Z809 Family history of malignant neoplasm, unspecified: Secondary | ICD-10-CM | POA: Diagnosis not present

## 2015-02-24 DIAGNOSIS — R197 Diarrhea, unspecified: Secondary | ICD-10-CM | POA: Diagnosis present

## 2015-02-24 DIAGNOSIS — R140 Abdominal distension (gaseous): Secondary | ICD-10-CM | POA: Diagnosis present

## 2015-02-24 DIAGNOSIS — I10 Essential (primary) hypertension: Secondary | ICD-10-CM | POA: Diagnosis present

## 2015-02-24 DIAGNOSIS — R1900 Intra-abdominal and pelvic swelling, mass and lump, unspecified site: Secondary | ICD-10-CM | POA: Diagnosis present

## 2015-02-24 DIAGNOSIS — Z818 Family history of other mental and behavioral disorders: Secondary | ICD-10-CM | POA: Diagnosis not present

## 2015-02-24 DIAGNOSIS — K219 Gastro-esophageal reflux disease without esophagitis: Secondary | ICD-10-CM | POA: Diagnosis present

## 2015-02-24 DIAGNOSIS — K5900 Constipation, unspecified: Secondary | ICD-10-CM | POA: Diagnosis present

## 2015-02-24 DIAGNOSIS — G40909 Epilepsy, unspecified, not intractable, without status epilepticus: Secondary | ICD-10-CM | POA: Diagnosis present

## 2015-02-24 DIAGNOSIS — G8929 Other chronic pain: Secondary | ICD-10-CM | POA: Diagnosis present

## 2015-02-24 LAB — CBC
HEMATOCRIT: 34.5 % — AB (ref 40.0–52.0)
HEMOGLOBIN: 11.9 g/dL — AB (ref 13.0–18.0)
MCH: 28.5 pg (ref 26.0–34.0)
MCHC: 34.4 g/dL (ref 32.0–36.0)
MCV: 82.9 fL (ref 80.0–100.0)
Platelets: 228 10*3/uL (ref 150–440)
RBC: 4.17 MIL/uL — ABNORMAL LOW (ref 4.40–5.90)
RDW: 14.1 % (ref 11.5–14.5)
WBC: 4.6 10*3/uL (ref 3.8–10.6)

## 2015-02-24 LAB — COMPREHENSIVE METABOLIC PANEL
ALBUMIN: 4.4 g/dL (ref 3.5–5.0)
ALT: 26 U/L (ref 17–63)
ANION GAP: 8 (ref 5–15)
AST: 22 U/L (ref 15–41)
Alkaline Phosphatase: 92 U/L (ref 38–126)
BILIRUBIN TOTAL: 0.1 mg/dL — AB (ref 0.3–1.2)
BUN: 6 mg/dL (ref 6–20)
CO2: 28 mmol/L (ref 22–32)
Calcium: 9.2 mg/dL (ref 8.9–10.3)
Chloride: 87 mmol/L — ABNORMAL LOW (ref 101–111)
Creatinine, Ser: 0.74 mg/dL (ref 0.61–1.24)
GFR calc Af Amer: >60 mL/min (ref >60)
GFR calc non Af Amer: >60 mL/min (ref >60)
GLUCOSE: 102 mg/dL — AB (ref 65–99)
POTASSIUM: 4.3 mmol/L (ref 3.5–5.1)
SODIUM: 123 mmol/L — AB (ref 135–145)
TOTAL PROTEIN: 7.7 g/dL (ref 6.5–8.1)

## 2015-02-24 LAB — BASIC METABOLIC PANEL
Anion gap: 6 (ref 5–15)
BUN: 6 mg/dL (ref 6–20)
CALCIUM: 8.7 mg/dL — AB (ref 8.9–10.3)
CO2: 27 mmol/L (ref 22–32)
Chloride: 89 mmol/L — ABNORMAL LOW (ref 101–111)
Creatinine, Ser: 0.71 mg/dL (ref 0.61–1.24)
GFR calc Af Amer: >60 mL/min (ref >60)
GLUCOSE: 91 mg/dL (ref 65–99)
POTASSIUM: 4.1 mmol/L (ref 3.5–5.1)
Sodium: 122 mmol/L — ABNORMAL LOW (ref 135–145)

## 2015-02-24 LAB — CORTISOL: Cortisol, Plasma: 14.3 ug/dL

## 2015-02-24 LAB — SODIUM, URINE, RANDOM: SODIUM UR: 101 mmol/L

## 2015-02-24 LAB — TSH: TSH: 1.78 u[IU]/mL (ref 0.350–4.500)

## 2015-02-24 LAB — OSMOLALITY, URINE: Osmolality, Ur: 412 mOsm/kg (ref 300–900)

## 2015-02-24 LAB — URIC ACID: URIC ACID, SERUM: 2.1 mg/dL — AB (ref 4.4–7.6)

## 2015-02-24 LAB — OSMOLALITY: Osmolality: 251 mOsm/kg — ABNORMAL LOW (ref 275–295)

## 2015-02-24 LAB — MRSA PCR SCREENING: MRSA by PCR: NEGATIVE

## 2015-02-24 MED ORDER — PANTOPRAZOLE SODIUM 40 MG PO TBEC
40.0000 mg | DELAYED_RELEASE_TABLET | Freq: Every day | ORAL | Status: DC
  Administered 2015-02-24 – 2015-02-25 (×2): 40 mg via ORAL
  Filled 2015-02-24 (×2): qty 1

## 2015-02-24 MED ORDER — HYDRALAZINE HCL 25 MG PO TABS
25.0000 mg | ORAL_TABLET | Freq: Three times a day (TID) | ORAL | Status: DC
  Administered 2015-02-24 – 2015-02-25 (×3): 25 mg via ORAL
  Filled 2015-02-24 (×3): qty 1

## 2015-02-24 MED ORDER — ACETAMINOPHEN 650 MG RE SUPP: 650.0000 mg | Freq: Four times a day (QID) | RECTAL | Status: DC | PRN

## 2015-02-24 MED ORDER — DOCUSATE SODIUM 100 MG PO CAPS
100.0000 mg | ORAL_CAPSULE | Freq: Two times a day (BID) | ORAL | Status: DC
  Administered 2015-02-24 – 2015-02-25 (×3): 100 mg via ORAL
  Filled 2015-02-24 (×3): qty 1

## 2015-02-24 MED ORDER — CITALOPRAM HYDROBROMIDE 20 MG PO TABS
20.0000 mg | ORAL_TABLET | Freq: Every day | ORAL | Status: DC
  Administered 2015-02-24 – 2015-02-25 (×2): 20 mg via ORAL
  Filled 2015-02-24 (×2): qty 1

## 2015-02-24 MED ORDER — METHOCARBAMOL 500 MG PO TABS
500.0000 mg | ORAL_TABLET | Freq: Four times a day (QID) | ORAL | Status: DC
  Administered 2015-02-24 – 2015-02-25 (×5): 500 mg via ORAL
  Filled 2015-02-24 (×9): qty 1

## 2015-02-24 MED ORDER — POLYETHYLENE GLYCOL 3350 17 G PO PACK: 17.0000 g | PACK | Freq: Every day | ORAL | Status: DC | PRN

## 2015-02-24 MED ORDER — ACETAMINOPHEN 325 MG PO TABS
650.0000 mg | ORAL_TABLET | Freq: Four times a day (QID) | ORAL | Status: DC | PRN
Start: 2015-02-24 — End: 2015-02-25
  Administered 2015-02-24: 19:00:00 650 mg via ORAL
  Filled 2015-02-24: qty 2

## 2015-02-24 MED ORDER — PROMETHAZINE HCL 25 MG PO TABS: 12.5000 mg | ORAL_TABLET | Freq: Four times a day (QID) | ORAL | Status: DC | PRN

## 2015-02-24 MED ORDER — LEVETIRACETAM 500 MG PO TABS
500.0000 mg | ORAL_TABLET | Freq: Every day | ORAL | Status: DC
  Administered 2015-02-24 – 2015-02-25 (×2): 500 mg via ORAL
  Filled 2015-02-24 (×2): qty 1

## 2015-02-24 MED ORDER — POLYETHYLENE GLYCOL 3350 17 G PO PACK
17.0000 g | PACK | Freq: Every day | ORAL | Status: DC
  Administered 2015-02-24 – 2015-02-25 (×2): 17 g via ORAL
  Filled 2015-02-24 (×2): qty 1

## 2015-02-24 MED ORDER — LORAZEPAM 1 MG PO TABS: 0.0000 mg | ORAL_TABLET | Freq: Two times a day (BID) | ORAL | Status: DC

## 2015-02-24 MED ORDER — LORAZEPAM 1 MG PO TABS
0.0000 mg | ORAL_TABLET | Freq: Four times a day (QID) | ORAL | Status: DC
  Administered 2015-02-25: 2 mg via ORAL

## 2015-02-24 MED ORDER — SODIUM CHLORIDE 0.9 % IJ SOLN
3.0000 mL | Freq: Two times a day (BID) | INTRAMUSCULAR | Status: DC
  Administered 2015-02-24 – 2015-02-25 (×3): 3 mL via INTRAVENOUS

## 2015-02-24 MED ORDER — IOHEXOL 300 MG/ML  SOLN
100.0000 mL | Freq: Once | INTRAMUSCULAR | Status: AC | PRN
  Administered 2015-02-24: 100 mL via INTRAVENOUS

## 2015-02-24 MED ORDER — SENNA 8.6 MG PO TABS
1.0000 | ORAL_TABLET | Freq: Two times a day (BID) | ORAL | Status: DC
  Administered 2015-02-24 – 2015-02-25 (×3): 8.6 mg via ORAL
  Filled 2015-02-24 (×3): qty 1

## 2015-02-24 MED ORDER — LORAZEPAM 2 MG/ML IJ SOLN: 1.0000 mg | Freq: Four times a day (QID) | INTRAMUSCULAR | Status: DC | PRN

## 2015-02-24 MED ORDER — LISINOPRIL 10 MG PO TABS
10.0000 mg | ORAL_TABLET | Freq: Every day | ORAL | Status: DC
  Administered 2015-02-24 – 2015-02-25 (×2): 10 mg via ORAL
  Filled 2015-02-24 (×2): qty 1

## 2015-02-24 MED ORDER — ENOXAPARIN SODIUM 40 MG/0.4ML ~~LOC~~ SOLN
40.0000 mg | SUBCUTANEOUS | Status: DC
  Administered 2015-02-24 – 2015-02-25 (×2): 40 mg via SUBCUTANEOUS
  Filled 2015-02-24 (×2): qty 0.4

## 2015-02-24 MED ORDER — SODIUM CHLORIDE 0.9 % IV SOLN
INTRAVENOUS | Status: DC
  Administered 2015-02-24: 14:00:00 via INTRAVENOUS

## 2015-02-24 MED ORDER — FOLIC ACID 1 MG PO TABS
1.0000 mg | ORAL_TABLET | Freq: Every day | ORAL | Status: DC
  Administered 2015-02-25: 09:00:00 1 mg via ORAL
  Filled 2015-02-24: qty 1

## 2015-02-24 MED ORDER — SODIUM CHLORIDE 0.9 % IV BOLUS (SEPSIS)
1000.0000 mL | Freq: Once | INTRAVENOUS | Status: AC
  Administered 2015-02-24: 1000 mL via INTRAVENOUS

## 2015-02-24 MED ORDER — VITAMIN B-1 100 MG PO TABS
100.0000 mg | ORAL_TABLET | Freq: Every day | ORAL | Status: DC
  Administered 2015-02-25: 100 mg via ORAL
  Filled 2015-02-24: qty 1

## 2015-02-24 MED ORDER — THIAMINE HCL 100 MG/ML IJ SOLN: 100.0000 mg | Freq: Every day | INTRAMUSCULAR | Status: DC

## 2015-02-24 MED ORDER — TRAZODONE HCL 50 MG PO TABS
150.0000 mg | ORAL_TABLET | Freq: Every day | ORAL | Status: DC
  Administered 2015-02-24: 150 mg via ORAL
  Filled 2015-02-24: qty 1

## 2015-02-24 MED ORDER — ADULT MULTIVITAMIN W/MINERALS CH
1.0000 | ORAL_TABLET | Freq: Every day | ORAL | Status: DC
  Administered 2015-02-25: 09:00:00 1 via ORAL
  Filled 2015-02-24: qty 1

## 2015-02-24 MED ORDER — CARBAMAZEPINE ER 200 MG PO TB12
200.0000 mg | ORAL_TABLET | Freq: Three times a day (TID) | ORAL | Status: DC
Start: 2015-02-24 — End: 2015-02-25
  Administered 2015-02-24 – 2015-02-25 (×4): 200 mg via ORAL
  Filled 2015-02-24 (×4): qty 1

## 2015-02-24 MED ORDER — IOHEXOL 240 MG/ML SOLN
25.0000 mL | Freq: Once | INTRAMUSCULAR | Status: AC | PRN
  Administered 2015-02-24: 25 mL via ORAL

## 2015-02-24 MED ORDER — BUSPIRONE HCL 10 MG PO TABS
15.0000 mg | ORAL_TABLET | Freq: Two times a day (BID) | ORAL | Status: DC
  Administered 2015-02-24 – 2015-02-25 (×3): 15 mg via ORAL
  Filled 2015-02-24 (×3): qty 2

## 2015-02-24 MED ORDER — LORAZEPAM 1 MG PO TABS
1.0000 mg | ORAL_TABLET | Freq: Four times a day (QID) | ORAL | Status: DC | PRN
  Filled 2015-02-24: qty 1

## 2015-02-24 MED ORDER — SODIUM CHLORIDE 0.9 % IV SOLN
INTRAVENOUS | Status: DC
  Administered 2015-02-24: 05:00:00 via INTRAVENOUS

## 2015-02-24 NOTE — Plan of Care (Signed)
Problem: Safety: Goal: Ability to remain free from injury will improve Outcome: Progressing Free from injury. Pt  Rings out when needing to go to br.  Problem: Bowel/Gastric: Goal: Will not experience complications related to bowel motility Outcome: Not Progressing md ordered  lazxs for pt today  In addition to stool softner pt already on  And it was given. Pt has  Passed gas this pm.

## 2015-02-24 NOTE — ED Notes (Signed)
ED tech notified that pt is ready for transport
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
## 2015-02-24 NOTE — Consult Note (Signed)
[facility] CONSULT NOTE    Date: 02/24/2015                  Patient Name:  [name]  MRN: [id number]  DOB: Sep 27, 1970  Age / Sex: 44 y.o., male         PCP: [name], PA-C                 Service Requesting Consult: Dr. Reece Levy                 Reason for Consult: Acute on chronic hyponatremia            History of Present Illness: Patient is a 44 y.o. male with a PMHx of chronic pain, traumatic brain injury, hypertension, history of abdominal surgery, who was admitted to [facility] on 02/23/2015 for evaluation of abdominal pain.  The patient was seen last week in the emergency room on 02/18/2015 for an abscess overlying his left cheek. He was given Bactrim and oxycodone for this. Subsequently the patient developed abdominal distention. He had not had a bowel movement for 1 week. He's had rather poor by mouth intake since this time and has had a few episodes of nausea and vomiting as well. He has baseline hyponatremia with serum sodiums ranging from 128-132 recently. Upon presentation serum sodium was low at 123. He is not on any IV fluid hydration at present. No reported seizure activity by his mother however patient has been rather lethargic recently. Patient was not on any diuretics at home.   Medications: Outpatient medications: Prescriptions prior to admission  Medication Sig Dispense Refill Last Dose  . busPIRone (BUSPAR) 15 MG tablet Take 15 mg by mouth 2 (two) times daily.   02/23/2015 at Unknown time  . carbamazepine (TEGRETOL XR) 200 MG 12 hr tablet Take 200 mg by mouth 3 (three) times daily.   02/23/2015 at Unknown time  . citalopram (CELEXA) 20 MG tablet Take 20 mg by mouth daily.    02/23/2015 at Unknown time  . levETIRAcetam (KEPPRA) 500 MG tablet Take 500 mg by mouth daily.   02/23/2015 at Unknown time  . lisinopril (PRINIVIL,ZESTRIL) 10 MG tablet Take 10 mg by mouth daily.   02/23/2015 at Unknown time  . methocarbamol (ROBAXIN) 500 MG  tablet Take 500 mg by mouth 4 (four) times daily.    02/23/2015 at Unknown time  . omeprazole (PRILOSEC) 20 MG capsule Take 40 mg by mouth every morning.   02/23/2015 at Unknown time  . Oxycodone HCl 10 MG TABS Take 10 mg by mouth 4 (four) times daily.   02/23/2015 at Unknown time  . sulfamethoxazole-trimethoprim (BACTRIM DS,SEPTRA DS) 800-160 MG tablet Take 1 tablet by mouth 2 (two) times daily.   02/23/2015 at Unknown time  . traZODone (DESYREL) 150 MG tablet Take 150 mg by mouth at bedtime.   02/22/2015 at Unknown time    Current medications: Current Facility-Administered Medications  Medication Dose Route Frequency Provider Last Rate Last Dose  . acetaminophen (TYLENOL) tablet 650 mg  650 mg Oral Q6H PRN Juluis Mire, MD       Or  . acetaminophen (TYLENOL) suppository 650 mg  650 mg Rectal Q6H PRN Juluis Mire, MD      . busPIRone (BUSPAR) tablet 15 mg  15 mg Oral BID Juluis Mire, MD   15 mg at 02/24/15 [id number]  . carbamazepine (TEGRETOL XR) 12 hr tablet 200 mg  200 mg Oral TID Neomia Dear  Marchia Meiers, MD   200 mg at 02/24/15 [id number]  . citalopram (CELEXA) tablet 20 mg  20 mg Oral Daily Juluis Mire, MD   20 mg at 02/24/15 0806  . docusate sodium (COLACE) capsule 100 mg  100 mg Oral BID Juluis Mire, MD   100 mg at 02/24/15 0806  . enoxaparin (LOVENOX) injection 40 mg  40 mg Subcutaneous Q24H Juluis Mire, MD   40 mg at 02/24/15 0451  . hydrALAZINE (APRESOLINE) tablet 25 mg  25 mg Oral 3 times per day Epifanio Lesches, MD      . levETIRAcetam (KEPPRA) tablet 500 mg  500 mg Oral Daily Juluis Mire, MD   500 mg at 02/24/15 0806  . lisinopril (PRINIVIL,ZESTRIL) tablet 10 mg  10 mg Oral Daily Juluis Mire, MD   10 mg at 02/24/15 0806  . methocarbamol (ROBAXIN) tablet 500 mg  500 mg Oral QID Juluis Mire, MD   500 mg at 02/24/15 0840  . pantoprazole (PROTONIX) EC tablet 40 mg  40 mg Oral Daily Juluis Mire, MD   40 mg at 02/24/15 [id number]  . polyethylene glycol  (MIRALAX / GLYCOLAX) packet 17 g  17 g Oral Daily PRN Juluis Mire, MD      . promethazine (PHENERGAN) tablet 12.5 mg  12.5 mg Oral Q6H PRN Juluis Mire, MD      . sodium chloride 0.9 % injection 3 mL  3 mL Intravenous Q12H Juluis Mire, MD   3 mL at 02/24/15 0813  . traZODone (DESYREL) tablet 150 mg  150 mg Oral QHS Juluis Mire, MD          Allergies: Allergies  Allergen Reactions  . Amitriptyline Other (See Comments)    Paralysis of pt       Past Medical History: Past Medical History  Diagnosis Date  . Chronic pain   . Poor historian   . Reflux   . Head injury   . Hypertension      Past Surgical History: Past Surgical History  Procedure Laterality Date  . Abdominal surgery    . Brain surgery       Family History: Family History  Problem Relation Age of Onset  . Cancer Mother   . Cancer Father   . Schizophrenia Brother      Social History: Social History   Social History  . Marital Status: Single    Spouse Name: N/A  . Number of Children: N/A  . Years of Education: N/A   Occupational History  . Not on file.   Social History Main Topics  . Smoking status: Current Every Day Smoker -- 1.00 packs/day    Types: Cigarettes  . Smokeless tobacco: Not on file  . Alcohol Use: No     Comment: Occ  . Drug Use: Yes    Special: Marijuana     Comment: occ  . Sexual Activity: Not on file   Other Topics Concern  . Not on file   Social History Narrative   ** Merged History Encounter **         Review of Systems: As per HPI  Vital Signs: Blood pressure 125/65, pulse 77, temperature 98.1 F (36.7 C), temperature source Oral, resp. rate 18, height 6\' 1"  (1.854 m), weight 85.775 kg (189 lb 1.6 oz), SpO2 98 %.  Weight trends: Filed Weights   02/23/15 2318 02/24/15 0436  Weight: 79.379 kg (175 lb) 85.775 kg (189 lb 1.6  oz)    Physical Exam: General: NAD, laying in bed  Head: Normocephalic, atraumatic.  Eyes: Anicteric, EOMI  Nose:  Mucous membranes dry, not inflammed, nonerythematous.  Throat: Oropharynx nonerythematous, no exudate appreciated, OM dry  Neck: Supple, trachea midline.  Lungs:  Normal respiratory effort. Clear to auscultation BL without crackles or wheezes.  Heart: RRR. S1 and S2 normal without gallop, murmur, or rubs.  Abdomen:  BS normoactive. Soft, Nondistended, non-tender.  No masses or organomegaly.  Extremities: No pretibial edema.  Neurologic: Lethargic but arousable, follows commands  Skin: Multiple tattoos noted all over body    Lab results: Basic Metabolic Panel:  Recent Labs Lab 02/23/15 2336 02/24/15 0510  NA 123* 122*  K 4.3 4.1  CL 87* 89*  CO2 28 27  GLUCOSE 102* 91  BUN 6 6  CREATININE 0.74 0.71  CALCIUM 9.2 8.7*    Liver Function Tests:  Recent Labs Lab 02/23/15 2336  AST 22  ALT 26  ALKPHOS 92  BILITOT 0.1*  PROT 7.7  ALBUMIN 4.4   No results for input(s): LIPASE, AMYLASE in the last 168 hours. No results for input(s): AMMONIA in the last 168 hours.  CBC:  Recent Labs Lab 02/23/15 2336 02/24/15 0510  WBC 5.9 4.6  NEUTROABS 2.4  --   HGB 12.9* 11.9*  HCT 37.3* 34.5*  MCV 82.9 82.9  PLT 240 228    Cardiac Enzymes: No results for input(s): CKTOTAL, CKMB, CKMBINDEX, TROPONINI in the last 168 hours.  BNP: Invalid input(s): POCBNP  CBG: No results for input(s): GLUCAP in the last 168 hours.  Microbiology: Results for orders placed or performed during the hospital encounter of 08/22/10  MRSA PCR Screening     Status: None   Collection Time: 08/22/10 10:04 PM  Result Value Ref Range Status   MRSA by PCR NEGATIVE NEGATIVE Final    Comment:        The GeneXpert MRSA Assay (FDA approved for NASAL specimens only), is one component of a comprehensive MRSA colonization surveillance program. It is not intended to diagnose MRSA infection nor to guide or monitor treatment for MRSA infections.  Culture, blood (routine x 2)     Status: None    Collection Time: 08/23/10 12:10 AM  Result Value Ref Range Status   Specimen Description BLOOD LEFT HAND  Final   Special Requests BOTTLES DRAWN AEROBIC AND ANAEROBIC 10CC EA  Final   Culture  Setup Time [id number]  Final   Culture NO GROWTH 5 DAYS  Final   Report Status 08/29/2010 FINAL  Final  Culture, blood (routine x 2)     Status: None   Collection Time: 08/23/10 12:22 AM  Result Value Ref Range Status   Specimen Description BLOOD RIGHT ARM  Final   Special Requests BOTTLES DRAWN AEROBIC AND ANAEROBIC 10CC EA  Final   Culture  Setup Time [id number]  Final   Culture   Final    STAPHYLOCOCCUS SPECIES (COAGULASE NEGATIVE) Note: THE SIGNIFICANCE OF ISOLATING THIS ORGANISM FROM A SINGLE SET OF BLOOD CULTURES WHEN MULTIPLE SETS ARE DRAWN IS UNCERTAIN. PLEASE NOTIFY THE MICROBIOLOGY DEPARTMENT WITHIN ONE WEEK IF SPECIATION AND SENSITIVITIES ARE REQUIRED. 12 Note: Gram Stain Report Called to,Read Back By and Verified With: KATIE BLOODWORTH @ [phone] ON 6 12 BY WILSN   Report Status 08/25/2010 FINAL  Final    Coagulation Studies: No results for input(s): LABPROT, INR in the last 72 hours.  Urinalysis: No results for input(s): COLORURINE, LABSPEC, [place], [place], [place],  BILIRUBINUR, KETONESUR, PROTEINUR, UROBILINOGEN, NITRITE, LEUKOCYTESUR in the last 72 hours.  Invalid input(s): APPERANCEUR    Imaging: Dg Abd 1 View  02/24/2015  CLINICAL DATA:  No bowel movement in 5 days. Abdominal pain and distention across the lower abdomen. EXAM: ABDOMEN - 1 VIEW COMPARISON:  Pelvis 03/24/2010 FINDINGS: The bowel gas pattern is normal. No radio-opaque calculi or other significant radiographic abnormality are seen. Inferior vena caval filter is present. IMPRESSION: Nonobstructive bowel gas pattern. Electronically Signed   By: Lucienne Capers M.D.   On: 02/24/2015 00:03   Ct Abdomen Pelvis W Contrast  02/24/2015  CLINICAL DATA:  Abdominal swelling for 4 days. Vomiting. Nausea. Patient is on  narcotic medication for chronic pain. Recent ED visit for painful cysts. Abdominal bloating and constipation. EXAM: CT ABDOMEN AND PELVIS WITH CONTRAST TECHNIQUE: Multidetector CT imaging of the abdomen and pelvis was performed using the standard protocol following bolus administration of intravenous contrast. CONTRAST:  110mL OMNIPAQUE IOHEXOL 300 MG/ML  SOLN COMPARISON:  03/24/2010 FINDINGS: Atelectasis in the lung bases. The liver, spleen, gallbladder, pancreas, adrenal glands, kidneys, abdominal aorta, and retroperitoneal lymph nodes are unremarkable. Inferior vena caval filter is in place. Stomach, small bowel, and colon are not abnormally distended. Stool fills the colon. No free air or free fluid in the abdomen. Pelvis: Prostate gland is mildly enlarged. Bladder wall is not thickened. Appendix is normal. No free or loculated pelvic fluid collections. No pelvic mass or lymphadenopathy. No destructive bone lesions. IMPRESSION: No acute process demonstrated in the abdomen or pelvis. No evidence of bowel obstruction or inflammation. Scattered stool filled colon. Electronically Signed   By: Lucienne Capers M.D.   On: 02/24/2015 01:23      Assessment & Plan: Pt is a 44 y.o. male with a PMHx of chronic pain, traumatic brain injury, hypertension, history of abdominal surgery, who was admitted to [facility] on 02/23/2015 for evaluation of abdominal pain.   1.  Acute on chronic hyponatremia. 2.  Constipation, was on narcotics as outpt. 3.  Hypertension. 4.  History of traumatic brain injury.    Plan:  The patient was on outpatient narcotics recently. This likely led to his constipation and poor by mouth intake. We suspect that there is at least some element of hypovolemic hyponatremia now though he does have underlying chronic hyponatremia likely related to his traumatic brain injury. We will start the patient on 0.9 normal saline at 75 cc per hour and follow serum sodium closely. We will also obtain serum  osmolality, urine osmolality, uric acid, and urine sodium. TSH was found to be normal. Treatment of constipation per hospitalist. Thanks for consultation.

## 2015-02-24 NOTE — Progress Notes (Signed)
[facility] [place] NAME: [name]    MR#:  [id number]  DATE OF BIRTH:  29-Nov-1970  SUBJECTIVE: Admitted for hyponatremia, constipation. Having  falls at home. Now feels slightly dizzy. No abdominal pain.    CHIEF COMPLAINT:   Chief Complaint  Patient presents with  . Abdominal Pain    REVIEW OF SYSTEMS:   ROS CONSTITUTIONAL: No fever, complains of fatigue.  EYES: No blurred or double vision.  EARS, NOSE, AND THROAT: No tinnitus or ear pain.  RESPIRATORY: No cough, shortness of breath, wheezing or hemoptysis.  CARDIOVASCULAR: No chest pain, orthopnea, edema.  GASTROINTESTINAL: No nausea, vomiting, diarrhea or abdominal pain.  GENITOURINARY: No dysuria, hematuria.  ENDOCRINE: No polyuria, nocturia,  HEMATOLOGY: No anemia, easy bruising or bleeding SKIN: No rash or lesion. MUSCULOSKELETAL: No joint pain or arthritis.   NEUROLOGIC: No tingling, numbness, weakness.  PSYCHIATRY: No anxiety or depression.   DRUG ALLERGIES:   Allergies  Allergen Reactions  . Amitriptyline Other (See Comments)    Paralysis of pt     VITALS:  Blood pressure 150/86, pulse 77, temperature 98.1 F (36.7 C), temperature source Oral, resp. rate 18, height 6\' 1"  (1.854 m), weight 85.775 kg (189 lb 1.6 oz), SpO2 98 %.  PHYSICAL EXAMINATION:  GENERAL:  44 y.o.-year-old patient lying in the bed with no acute distress.  EYES: Pupils equal, round, reactive to light and accommodation. No scleral icterus. Extraocular muscles intact.  HEENT: Head atraumatic, normocephalic. Oropharynx and nasopharynx clear.  NECK:  Supple, no jugular venous distention. No thyroid enlargement, no tenderness.  LUNGS: Normal breath sounds bilaterally, no wheezing, rales,rhonchi or crepitation. No use of accessory muscles of respiration.  CARDIOVASCULAR: S1, S2 normal. No murmurs, rubs, or gallops.  ABDOMEN: Soft, nontender, nondistended. Bowel sounds present. No  organomegaly or mass.  EXTREMITIES: No pedal edema, cyanosis, or clubbing.  NEUROLOGIC: Cranial nerves II through XII are intact. Muscle strength 5/5 in all extremities. Sensation intact. Gait not checked.  PSYCHIATRIC: The patient is alert and oriented x 3.  SKIN: No obvious rash, lesion, or ulcer.    LABORATORY PANEL:   CBC  Recent Labs Lab 02/24/15 0510  WBC 4.6  HGB 11.9*  HCT 34.5*  PLT 228   ------------------------------------------------------------------------------------------------------------------  Chemistries   Recent Labs Lab 02/23/15 2336 02/24/15 0510  NA 123* 122*  K 4.3 4.1  CL 87* 89*  CO2 28 27  GLUCOSE 102* 91  BUN 6 6  CREATININE 0.74 0.71  CALCIUM 9.2 8.7*  AST 22  --   ALT 26  --   ALKPHOS 92  --   BILITOT 0.1*  --    ------------------------------------------------------------------------------------------------------------------  Cardiac Enzymes No results for input(s): TROPONINI in the last 168 hours. ------------------------------------------------------------------------------------------------------------------  RADIOLOGY:  Dg Abd 1 View  02/24/2015  CLINICAL DATA:  No bowel movement in 5 days. Abdominal pain and distention across the lower abdomen. EXAM: ABDOMEN - 1 VIEW COMPARISON:  Pelvis 03/24/2010 FINDINGS: The bowel gas pattern is normal. No radio-opaque calculi or other significant radiographic abnormality are seen. Inferior vena caval filter is present. IMPRESSION: Nonobstructive bowel gas pattern. Electronically Signed   By: Lucienne Capers M.D.   On: 02/24/2015 00:03   Ct Abdomen Pelvis W Contrast  02/24/2015  CLINICAL DATA:  Abdominal swelling for 4 days. Vomiting. Nausea. Patient is on narcotic medication for chronic pain. Recent ED visit for painful cysts. Abdominal bloating and constipation. EXAM: CT ABDOMEN AND PELVIS WITH CONTRAST TECHNIQUE:  Multidetector CT imaging of the abdomen and pelvis was performed using the  standard protocol following bolus administration of intravenous contrast. CONTRAST:  14mL OMNIPAQUE IOHEXOL 300 MG/ML  SOLN COMPARISON:  03/24/2010 FINDINGS: Atelectasis in the lung bases. The liver, spleen, gallbladder, pancreas, adrenal glands, kidneys, abdominal aorta, and retroperitoneal lymph nodes are unremarkable. Inferior vena caval filter is in place. Stomach, small bowel, and colon are not abnormally distended. Stool fills the colon. No free air or free fluid in the abdomen. Pelvis: Prostate gland is mildly enlarged. Bladder wall is not thickened. Appendix is normal. No free or loculated pelvic fluid collections. No pelvic mass or lymphadenopathy. No destructive bone lesions. IMPRESSION: No acute process demonstrated in the abdomen or pelvis. No evidence of bowel obstruction or inflammation. Scattered stool filled colon. Electronically Signed   By: Lucienne Capers M.D.   On: 02/24/2015 01:23    EKG:   Orders placed or performed during the hospital encounter of 09/17/13  . EKG (if new onset seizures)  . EKG (if new onset seizures)  . EKG 12-Lead  . EKG 12-Lead  . EKG    ASSESSMENT AND PLAN:   Acute on chronic hyponatremia secondary to psychogenic polydipsia. Patient admits that she drinks a lot of water and other beverages. Hold off on IV fluids up. Appreciate nephrology consult. Continue fluid restriction.. Check urine osmolality, urine sodium. Sodium is followed down at 122. #2 constipation with abdominal pain and distention: Continue stool softeners. #3. Multiple falls at home: Check physical therapy evaluation.. #4 malaise;, nausea, falls likely secondary to hyponatremia. #5h/o TBI; status post brain surgery. #6 history of seizures stable on medication.  Meets  criteria for inpatient admission. Due to hyponatremia,. All the records are reviewed and case discussed with Care Management/Social Workerr. Management plans discussed with the patient, family and they are in  agreement.  CODE STATUS:full  TOTAL TIME TAKING CARE OF THIS PATIENT: 35 minutes.   POSSIBLE D/C IN 1-2DAYS, DEPENDING ON CLINICAL CONDITION.   Epifanio Lesches M.D on 02/24/2015 at 9:15 AM  Between 7am to 6pm - Pager - [phone]  After 6pm go to [email or web address] - password [email or web address] [facility]  Office  [phone]  CC: Primary care physician; [name], PA-C   Note: This dictation was prepared with Dragon dictation along with smaller phrase technology. Any transcriptional errors that result from this process are unintentional.

## 2015-02-24 NOTE — H&P (Signed)
[facility] [place] NAME: [name]    MR#:  [id number]  DATE OF BIRTH:  [birth year]  DATE OF ADMISSION:  02/23/2015  PRIMARY CARE PHYSICIAN: [name], PA-C   REQUESTING/REFERRING PHYSICIAN: Dahlia Client  CHIEF COMPLAINT:   Chief Complaint  Patient presents with  . Abdominal Pain   Swelling of the abdomen Constipation HISTORY OF PRESENT ILLNESS:  [name]  is a 44 y.o. male with a known history of traumatic  brain injury status post brain surgery, hypertension, seizure disorder, GERD presents to the emergency room with the complaints of abdominal distention/swelling of 4 days' duration.   Patient was recently seen at this emergency room on 02/18/2015 for abscess over her left cheek and was sent home on by mouth Bactrim and oxycodone. He developed distention of abdomen for the past 4 days and was unable to have a bowel movement for the past few days, hence came to the ED. Does have some nausea and spitting but denies any vomiting. History of irregular bowel movements with alternating diarrhea and constipation for the past many months. Denies any fever, chills, chest pain, shortness of breath, dizziness, headache, cough, dysuria. Denies any blood in stools or urine. Does complain of some chronic fatigue. Admits poor oral intake for the past few days.  Evaluation in the ED revealed stable vital signs and patient alert awake and oriented 3, and lab work revealed sodium of 123. Rest of his labs were unremarkable. X-ray of the abdomen negative for free air. CT of the abdomen and the pelvis negative for acute intra-abdominal pathology. Hospitalist service was consulted because of hyponatremia. Of note his previous labs dated July 2016 showed sodium of 128. According to patient's mom, patient does have a history of chronic hyponatremia and was admitted once in the past a year ago. Patient does take multiple psychiatric medications and  seizure medications following traumatic brain injury. Last seizure was more than a year ago. Abscess over left cheek are improving with oral antibiotics.  PAST MEDICAL HISTORY:   Past Medical History  Diagnosis Date  . Chronic pain   . Poor historian   . Reflux   . Head injury   . Hypertension     PAST SURGICAL HISTORY:   Past Surgical History  Procedure Laterality Date  . Abdominal surgery    . Brain surgery      SOCIAL HISTORY:   Social History  Substance Use Topics  . Smoking status: Current Every Day Smoker -- 1.00 packs/day    Types: Cigarettes  . Smokeless tobacco: Not on file  . Alcohol Use: No     Comment: Occ    FAMILY HISTORY:   Family History  Problem Relation Age of Onset  . Cancer Mother   . Cancer Father   . Schizophrenia Brother     DRUG ALLERGIES:   Allergies  Allergen Reactions  . Amitriptyline Other (See Comments)    Paralysis of pt     REVIEW OF SYSTEMS:   Review of Systems  Constitutional: Positive for malaise/fatigue (Chronic). Negative for fever and chills.  HENT: Negative for ear pain, hearing loss, nosebleeds, sore throat and tinnitus.   Eyes: Negative for blurred vision, double vision, pain, discharge and redness.  Respiratory: Negative for cough, hemoptysis, sputum production, shortness of breath and wheezing.   Cardiovascular: Negative for chest pain, palpitations, orthopnea and leg swelling.  Gastrointestinal: Positive for nausea, abdominal pain (Distention +) and constipation. Negative for vomiting, diarrhea,  blood in stool and melena.  Genitourinary: Negative for dysuria, urgency, frequency and hematuria.  Musculoskeletal: Negative for back pain, joint pain and neck pain.  Skin: Negative for itching and rash.  Neurological: Negative for dizziness, tingling, sensory change, focal weakness and seizures.  Endo/Heme/Allergies: Does not bruise/bleed easily.  Psychiatric/Behavioral: Positive for depression. The patient has  insomnia. The patient is not nervous/anxious.     MEDICATIONS AT HOME:   Prior to Admission medications   Medication Sig Start Date End Date Taking? Authorizing Provider  busPIRone (BUSPAR) 15 MG tablet Take 15 mg by mouth 2 (two) times daily.   Yes Historical Provider, MD  carbamazepine (TEGRETOL XR) 200 MG 12 hr tablet Take 200 mg by mouth 3 (three) times daily. 08/26/14  Yes Historical Provider, MD  citalopram (CELEXA) 20 MG tablet Take 20 mg by mouth daily.  09/02/13  Yes Historical Provider, MD  levETIRAcetam (KEPPRA) 500 MG tablet Take 500 mg by mouth daily. 08/25/14  Yes Historical Provider, MD  lisinopril (PRINIVIL,ZESTRIL) 10 MG tablet Take 10 mg by mouth daily.   Yes Historical Provider, MD  methocarbamol (ROBAXIN) 500 MG tablet Take 500 mg by mouth 4 (four) times daily.  09/02/13  Yes Historical Provider, MD  omeprazole (PRILOSEC) 20 MG capsule Take 40 mg by mouth every morning. 08/25/14  Yes Historical Provider, MD  Oxycodone HCl 10 MG TABS Take 10 mg by mouth 4 (four) times daily.   Yes Historical Provider, MD  sulfamethoxazole-trimethoprim (BACTRIM DS,SEPTRA DS) 800-160 MG tablet Take 1 tablet by mouth 2 (two) times daily.   Yes Historical Provider, MD  traZODone (DESYREL) 150 MG tablet Take 150 mg by mouth at bedtime. 08/25/14  Yes Historical Provider, MD      VITAL SIGNS:  Blood pressure 154/102, pulse 64, temperature 98.9 F (37.2 C), temperature source Oral, resp. rate 15, height 6\' 1"  (1.854 m), weight 79.379 kg (175 lb), SpO2 100 %.  PHYSICAL EXAMINATION:  Physical Exam  Constitutional: He is oriented to person, place, and time. He appears well-developed and well-nourished. No distress.  HENT:  Head: Normocephalic and atraumatic.  Right Ear: External ear normal.  Left Ear: External ear normal.  Nose: Nose normal.  Mouth/Throat: Oropharynx is clear and moist. No oropharyngeal exudate.  Eyes: EOM are normal. Pupils are equal, round, and reactive to light. No scleral  icterus.  Neck: Normal range of motion. Neck supple. No JVD present. No thyromegaly present.  Cardiovascular: Normal rate, regular rhythm, normal heart sounds and intact distal pulses.  Exam reveals no friction rub.   No murmur heard. Respiratory: Effort normal and breath sounds normal. No respiratory distress. He has no wheezes. He has no rales. He exhibits no tenderness.  GI: Soft. Bowel sounds are normal. He exhibits distension (Soft distention +). He exhibits no mass. There is no tenderness. There is no rebound and no guarding.  Musculoskeletal: Normal range of motion. He exhibits no edema.  Lymphadenopathy:    He has no cervical adenopathy.  Neurological: He is alert and oriented to person, place, and time. He has normal reflexes. He displays normal reflexes. No cranial nerve deficit. He exhibits normal muscle tone.  Skin: Skin is warm. No rash noted. No erythema.  Psychiatric: He has a normal mood and affect. His behavior is normal. Thought content normal.   LABORATORY PANEL:   CBC  Recent Labs Lab 02/23/15 2336  WBC 5.9  HGB 12.9*  HCT 37.3*  PLT 240   ------------------------------------------------------------------------------------------------------------------  Chemistries   Recent Labs  Lab 02/23/15 2336  NA 123*  K 4.3  CL 87*  CO2 28  GLUCOSE 102*  BUN 6  CREATININE 0.74  CALCIUM 9.2  AST 22  ALT 26  ALKPHOS 92  BILITOT 0.1*   ------------------------------------------------------------------------------------------------------------------  Cardiac Enzymes No results for input(s): TROPONINI in the last 168 hours. ------------------------------------------------------------------------------------------------------------------  RADIOLOGY:  Dg Abd 1 View  02/24/2015  CLINICAL DATA:  No bowel movement in 5 days. Abdominal pain and distention across the lower abdomen. EXAM: ABDOMEN - 1 VIEW COMPARISON:  Pelvis 03/24/2010 FINDINGS: The bowel gas pattern  is normal. No radio-opaque calculi or other significant radiographic abnormality are seen. Inferior vena caval filter is present. IMPRESSION: Nonobstructive bowel gas pattern. Electronically Signed   By: Lucienne Capers M.D.   On: 02/24/2015 00:03   Ct Abdomen Pelvis W Contrast  02/24/2015  CLINICAL DATA:  Abdominal swelling for 4 days. Vomiting. Nausea. Patient is on narcotic medication for chronic pain. Recent ED visit for painful cysts. Abdominal bloating and constipation. EXAM: CT ABDOMEN AND PELVIS WITH CONTRAST TECHNIQUE: Multidetector CT imaging of the abdomen and pelvis was performed using the standard protocol following bolus administration of intravenous contrast. CONTRAST:  110mL OMNIPAQUE IOHEXOL 300 MG/ML  SOLN COMPARISON:  03/24/2010 FINDINGS: Atelectasis in the lung bases. The liver, spleen, gallbladder, pancreas, adrenal glands, kidneys, abdominal aorta, and retroperitoneal lymph nodes are unremarkable. Inferior vena caval filter is in place. Stomach, small bowel, and colon are not abnormally distended. Stool fills the colon. No free air or free fluid in the abdomen. Pelvis: Prostate gland is mildly enlarged. Bladder wall is not thickened. Appendix is normal. No free or loculated pelvic fluid collections. No pelvic mass or lymphadenopathy. No destructive bone lesions. IMPRESSION: No acute process demonstrated in the abdomen or pelvis. No evidence of bowel obstruction or inflammation. Scattered stool filled colon. Electronically Signed   By: Lucienne Capers M.D.   On: 02/24/2015 01:23    EKG:   Orders placed or performed during the hospital encounter of 09/17/13  . EKG (if new onset seizures)  . EKG (if new onset seizures)  . EKG 12-Lead  . EKG 12-Lead  . EKG    IMPRESSION AND PLAN:   44 year old male with history of traumatic brain injury status post brain surgery, hypertension, seizure disorder, GERD, recent ER visit 5 days ago for abscess  on cheek presents with the complaints  of abdominal swelling/distention and constipation, found to have hyponatremia of 123.  1. Abdominal distention/discomfort with inability to have bowel movements. CT of abdomen and extremities abdomen negative for acute intra-abdominal pathology. Abdominal distentions likely secondary to constipation because of recent opioid usage. Patient does have a history of irregular bowel movements alternating diarrhea with constipation. 2. Hyponatremia with sodium of 123. History of chronic hyponatremia, serum sodium in July 2016 was 128. History of prior admission 1 secondary to hyponatremia. Patient on multiple psychiatric and seizure medications subsequent to brain surgery following traumatic brain injury. Likely cause of hyponatremia central in origin and possible medication related. 3. Hypertension, patient on lisinopril. BP not in optimal range. Admits he did not take his medications yesterday. 4. History of traumatic brain injury status post brain surgery, stable. 5. History of seizure disorder, stable on meds. 6. History of GERD, stable on PPI.  Plan: Admit for observation, IV hydration, follow-up BMP. Check urine sodium, tsh. Nephrology consultation requested for further advice. -MiraLAX. Consider enema if constipation not resolved. -Continue lisinopril for blood pressure, follow BP measurements.    All the  records are reviewed and case discussed with ED provider. Management plans discussed with the patient, family and they are in agreement.  CODE STATUS: Full code DVT prophylaxis: Lovenox  TOTAL TIME TAKING CARE OF THIS PATIENT: 50 minutes.    Juluis Mire M.D on 02/24/2015 at 3:05 AM  Between 7am to 6pm - Pager - [phone]  After 6pm go to [email or web address] - password [email or web address] [facility]  [facility]  Office  [phone]  CC: Primary care physician; [name], PA-C

## 2015-02-24 NOTE — ED Notes (Signed)
Pt finished contrast, notified CT
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
## 2015-02-24 NOTE — ED Notes (Signed)
Patient transported to CT 
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
## 2015-02-24 NOTE — Plan of Care (Signed)
Problem: Education: Goal: Knowledge of [facility] [place] [facility] Education information/materials will improve Outcome: Progressing Patient information handout given to patient.  Patient oriented with room and bed.  Education provided on medications given (lovenox) and care plan for the shift including low sodium and reason for IVF.  Patient expressed understanding of all education.  Problem: Safety: Goal: Ability to remain free from injury will improve Outcome: Progressing Patient stated that he had several falls at home.  He also has a history of seizure activity due to previous head trauma.  Patient placed on high fall and seizure precautions.  Patient educated on using call bell to ask for assistance before leaving bed.  Patient acknowledge understanding.  Bed in lowest position with call bell and phone within reach.  Bed alarm on.  Problem: Bowel/Gastric: Goal: Will not experience complications related to bowel motility Outcome: Progressing Patient admitted for abdominal pain and hyponatremia.  Marland Kitchen9 NS infusing at 125 mL/hr.  Abdomen is distended with tenderness and guarding.  Bowel sounds are hypoactive.

## 2015-02-24 NOTE — Progress Notes (Signed)
Initial Nutrition Assessment   INTERVENTION:   Meals and Snacks: Cater to patient preferences. RD notes 1L fluid restriction daily. Medical Food Supplement Therapy: will recommend Magic Cup BID for added nutrition   NUTRITION DIAGNOSIS:   Altered nutrition lab value related to acute illness as evidenced by  (electrolyte and renal profile).  GOAL:   Patient will meet greater than or equal to 90% of their needs  MONITOR:    (Energy Intake, Electrolyte and Renal Profile, Digestive system, Anthropometrics)  REASON FOR ASSESSMENT:   Malnutrition Screening Tool    ASSESSMENT:   Pt admitted with falls and abdominal distension. Pt also found to be hyponatremic; nephrology consulted.  Past Medical History  Diagnosis Date  . Chronic pain   . Poor historian   . Reflux   . Head injury   . Hypertension      Diet Order:  Diet regular Room service appropriate?: Yes; Fluid consistency:: Thin; Fluid restriction:: Other (see comments)   Current Nutrition: Pt reports eating breakfast this am by nodding his head. Recorded po intake 100% of breakfast in I/O chart.   Food/Nutrition-Related History: Unable to fully clarify on visit, as pt wanting to sleep. Pt did shake head no when asked if he usually drinks a supplement such as Ensure. Per MST no decrease in appetite PTA.   Scheduled Medications:  . busPIRone  15 mg Oral BID  . carbamazepine  200 mg Oral TID  . citalopram  20 mg Oral Daily  . docusate sodium  100 mg Oral BID  . enoxaparin (LOVENOX) injection  40 mg Subcutaneous Q24H  . hydrALAZINE  25 mg Oral 3 times per day  . levETIRAcetam  500 mg Oral Daily  . lisinopril  10 mg Oral Daily  . methocarbamol  500 mg Oral QID  . pantoprazole  40 mg Oral Daily  . polyethylene glycol  17 g Oral Daily  . senna  1 tablet Oral BID  . sodium chloride  3 mL Intravenous Q12H  . traZODone  150 mg Oral QHS    Continuous Medications:  . sodium chloride       Electrolyte/Renal  Profile and Glucose Profile:   Recent Labs Lab 02/23/15 2336 02/24/15 0510  NA 123* 122*  K 4.3 4.1  CL 87* 89*  CO2 28 27  BUN 6 6  CREATININE 0.74 0.71  CALCIUM 9.2 8.7*  GLUCOSE 102* 91   Protein Profile:  Recent Labs Lab 02/23/15 2336  ALBUMIN 4.4    Gastrointestinal Profile: abdomen distended, active BS Last BM: unknown   Nutrition-Focused Physical Exam Findings:  Unable to complete Nutrition-Focused physical exam at this time.    Weight Change: Per [facility] weight gain. Per MST pt did not know weight trend PTA   Height:   Ht Readings from Last 1 Encounters:  02/23/15 6\' 1"  (1.854 m)    Weight:   Wt Readings from Last 1 Encounters:  02/24/15 189 lb 1.6 oz (85.775 kg)    Wt Readings from Last 10 Encounters:  02/24/15 189 lb 1.6 oz (85.775 kg)  02/18/15 180 lb (81.647 kg)  09/17/14 180 lb (81.647 kg)  05/30/13 161 lb (73.029 kg)     BMI:  Body mass index is 24.95 kg/(m^2).  Estimated Nutritional Needs:   Kcal:  BEE: 1800kcals, TEE: (IF 1.0-1.2)(AF 1.2) 2160-2592kcals  Protein:  69-86g protein (0.8-1.0g/kg)  Fluid:  2142-2561mL of fluid (25-41mL/kg)  EDUCATION NEEDS:   No education needs identified at this time   [place]  Level  Dwyane Luo, RD, LDN Pager [phone]

## 2015-02-25 LAB — BASIC METABOLIC PANEL
Anion gap: 6 (ref 5–15)
BUN: 7 mg/dL (ref 6–20)
CHLORIDE: 91 mmol/L — AB (ref 101–111)
CO2: 29 mmol/L (ref 22–32)
CREATININE: 0.72 mg/dL (ref 0.61–1.24)
Calcium: 8.8 mg/dL — ABNORMAL LOW (ref 8.9–10.3)
GFR calc Af Amer: >60 mL/min (ref >60)
GFR calc non Af Amer: >60 mL/min (ref >60)
GLUCOSE: 109 mg/dL — AB (ref 65–99)
POTASSIUM: 4.3 mmol/L (ref 3.5–5.1)
SODIUM: 126 mmol/L — AB (ref 135–145)

## 2015-02-25 MED ORDER — HYDRALAZINE HCL 25 MG PO TABS: 25.0000 mg | ORAL_TABLET | Freq: Three times a day (TID) | ORAL | Status: DC

## 2015-02-25 MED ORDER — POLYETHYLENE GLYCOL 3350 17 G PO PACK: 17.0000 g | PACK | Freq: Every day | ORAL | Status: DC

## 2015-02-25 MED ORDER — SENNA 8.6 MG PO TABS: 1.0000 | ORAL_TABLET | Freq: Two times a day (BID) | ORAL | Status: DC

## 2015-02-25 NOTE — Plan of Care (Signed)
Problem: Education: Goal: Knowledge of [place] [facility] Education information/materials will improve Outcome: Progressing Education provided to patient and mother about medications and plan of care for this shift.  Mother very anxious about high blood pressure.  Education on addition of hydralazine.  Mother with multiple questions about diagnosis and home medications advised to consult physician in the am about specific diagnosis questions.  Problem: Safety: Goal: Ability to remain free from injury will improve Outcome: Progressing  Patient placed on high fall and seizure precautions. Patient with agitation and restlessness this shift.  Patient admitting to use of ETOH at home.  RN concerned that patient is CIWA.  Paged physician and requested CIWA protocol which was ordered.  Patient given 2mg  of Ativan per eMAR.  Patient able to relax and rest remainder of shift.  Bed in lowest position with call bell and phone within reach. Bed alarm on.  Problem: Bowel/Gastric: Goal: Will not experience complications related to bowel motility Outcome: Progressing Patient continues with hyponatremia. Marland Kitchen9 NS infusing at 75 mL/hr. Abdomen is distended with tenderness and guarding. Bowel sounds are audible and patient passing gas this shift.  Given Colace and Senokot per eMAR.

## 2015-02-25 NOTE — Progress Notes (Signed)
Pt for discharge home.  Instructions discussed with pt.  meds discussed. Diet, activity and f/u discussed.  Verbalizes understanding. Home at this time via w/c w/o c/o.

## 2015-02-25 NOTE — Discharge Instructions (Signed)
Hyponatremia °Hyponatremia is when the amount of salt (sodium) in your blood is too low. When sodium levels are low, your cells absorb extra water and they swell. The swelling happens throughout the body, but it mostly affects the brain. °CAUSES °This condition may be caused by: °· Heart, kidney, or liver problems. °· Thyroid problems. °· Adrenal gland problems. °· Metabolic conditions, such as syndrome of inappropriate antidiuretic hormone (SIADH). °· Severe vomiting and diarrhea. °· Certain medicines or illegal drugs. °· Dehydration. °· Drinking too much water. °· Eating a diet that is low in sodium. °· Large burns on your body. °· Sweating. °RISK FACTORS °This condition is more likely to develop in people who: °· Have long-term (chronic) kidney disease. °· Have heart failure. °· Have a medical condition that causes frequent or excessive diarrhea. °· Have metabolic conditions, such as Addison disease or SIADH. °· Take certain medicines that affect the sodium and fluid balance in the blood. Some of these medicine types include: °¨ Diuretics. °¨ NSAIDs. °¨ Some opioid pain medicines. °¨ Some antidepressants. °¨ Some seizure prevention medicines. °SYMPTOMS  °Symptoms of this condition include: °· Nausea and vomiting. °· Confusion. °· Lethargy. °· Agitation. °· Headache. °· Seizures. °· Unconsciousness. °· Appetite loss. °· Muscle weakness and cramping. °· Feeling weak or light-headed. °· Having a rapid heart rate. °· Fainting, in severe cases. °DIAGNOSIS °This condition is diagnosed with a medical history and physical exam. You will also have other tests, including: °· Blood tests. °· Urine tests. °TREATMENT °Treatment for this condition depends on the cause. Treatment may include: °· Fluids given through an IV tube that is inserted into one of your veins. °· Medicines to correct the sodium imbalance. If medicines are causing the condition, the medicines will need to be adjusted. °· Limiting water or fluid intake to  get the correct sodium balance. °HOME CARE INSTRUCTIONS °· Take medicines only as directed by your health care provider. Many medicines can make this condition worse. Talk with your health care provider about any medicines that you are currently taking. °· Carefully follow a recommended diet as directed by your health care provider. °· Carefully follow instructions from your health care provider about fluid restrictions. °· Keep all follow-up visits as directed by your health care provider. This is important. °· Do not drink alcohol. °SEEK MEDICAL CARE IF: °· You develop worsening nausea, fatigue, headache, confusion, or weakness. °· Your symptoms go away and then return. °· You have problems following the recommended diet. °SEEK IMMEDIATE MEDICAL CARE IF: °· You have a seizure. °· You faint. °· You have ongoing diarrhea or vomiting. °  °This information is not intended to replace advice given to you by your health care provider. Make sure you discuss any questions you have with your health care provider. °  °Document Released: 02/18/2002 Document Revised: 07/15/2014 Document Reviewed: 03/20/2014 °[employer]vier Interactive Patient Education ©2016 [employer] ° ° ° °

## 2015-02-25 NOTE — Discharge Summary (Signed)
[name], is a 44 y.o. male  DOB 05-07-1970  MRN [id number].  Admission date:  02/23/2015  Admitting Physician  Juluis Mire, MD  Discharge Date:  02/25/2015   Primary MD  [name], PA-C  Recommendations for primary care physician for things to follow:   PCP is [facility]     Admission Diagnosis  Hyponatremia [E87.1]   Discharge Diagnosis  Hyponatremia [E87.1]    Principal Problem:   Hyponatremia Active Problems:   Abdominal distension   HTN (hypertension)   Hx of traumatic brain injury   HTN (hypertension), benign      Past Medical History  Diagnosis Date  . Chronic pain   . Poor historian   . Reflux   . Head injury   . Hypertension     Past Surgical History  Procedure Laterality Date  . Abdominal surgery    . Brain surgery         History of present illness and  Hospital Course:     Kindly see H&P for history of present illness and admission details, please review complete Labs, Consult reports and Test reports for all details in brief  HPI  from the history and physical done on the day of admission 44 year old male with history of traumatic brain injury status post surgery, hypertension, seizure disorder, GERD came because of abdominal distention and swelling for 4 days duration. Admitted for hyponatremia and constipation  Hospital Course   1. hyponatremia: Initial sodium 123. Patient has chronic hyponatremia baseline sodium 128. At the time of admission he had a dizziness. Started on IV hydration, seen by nephrology Dr. months with [facility]. Thought to have a hypovolemic hyponatremia in addition to hyponatremia from TBI. Patient's sodium improved after gentle hydration to 126. He feels much better today. Advised  fluid restriction to 1.5 L a day. He says he drinks a lot  of water. #2. As to patient: Abdominal pain and constipation on admission. CT abdomen did not show any acute findings. Started on stool softeners. According to the patient he had diarrhea before he came to the hospital and then constipation. Advised to use stool softeners as needed. #3 history of TBI, seizure disorder: Continue Tegretol, Keppra. Up with primary doctor. For hypertension uncontrolled: Started on hydralazine 25 mg 3 times a day in addition to the lisinopril. Blood pressure is better today.  4. H/o  of fall at home. No physical therapy recommendation for home PT.Marland Kitchen Discharge Condition: stable   Follow UP  Follow-up Information    Follow up  with MANN, BENJAMIN, PA-C In 1 week.   Specialties:  Physician Assistant, Internal Medicine   Contact information:   [address] [place] [id number] [phone]         Discharge Instructions  and  Discharge Medications  Fluid restriction to 1.5 litres     Medication List    STOP taking these medications        sulfamethoxazole-trimethoprim 800-160 MG tablet  Commonly known as:  BACTRIM DS,SEPTRA DS      TAKE these medications        busPIRone 15 MG tablet  Commonly known as:  BUSPAR  Take 15 mg by mouth 2 (two) times daily.     carbamazepine 200 MG 12 hr tablet  Commonly known as:  TEGRETOL XR  Take 200 mg by mouth 3 (three) times daily.     citalopram 20 MG tablet  Commonly known as:  CELEXA  Take 20 mg by mouth daily.     hydrALAZINE 25 MG tablet  Commonly known as:  APRESOLINE  Take 1 tablet (25 mg total) by mouth 3 (three) times daily.     levETIRAcetam 500 MG tablet  Commonly known as:  KEPPRA  Take 500 mg by mouth daily.     lisinopril 10 MG tablet  Commonly known as:  PRINIVIL,ZESTRIL  Take 10 mg by mouth daily.     methocarbamol 500 MG tablet  Commonly known as:  ROBAXIN  Take 500 mg by mouth 4 (four) times daily.     omeprazole 20 MG capsule  Commonly known as:  PRILOSEC  Take 40  mg by mouth every morning.     Oxycodone HCl 10 MG Tabs  Take 10 mg by mouth 4 (four) times daily.     polyethylene glycol packet  Commonly known as:  MIRALAX / GLYCOLAX  Take 17 g by mouth daily.     senna 8.6 MG Tabs tablet  Commonly known as:  SENOKOT  Take 1 tablet (8.6 mg total) by mouth 2 (two) times daily.     traZODone 150 MG tablet  Commonly known as:  DESYREL  Take 150 mg by mouth at bedtime.          Diet and Activity recommendation: See Discharge Instructions above   Consults obtained - nephrology   Major procedures and Radiology Reports - PLEASE review detailed and final reports for all details, in brief -      Dg Abd 1 View  02/24/2015  CLINICAL DATA:  No bowel movement in 5 days. Abdominal pain and distention across the lower abdomen. EXAM: ABDOMEN - 1 VIEW COMPARISON:  Pelvis 03/24/2010 FINDINGS: The bowel gas pattern is normal. No radio-opaque calculi or other significant radiographic abnormality are seen. Inferior vena caval filter is present. IMPRESSION: Nonobstructive bowel gas pattern. Electronically Signed   By: Lucienne Capers M.D.   On: 02/24/2015 00:03   Ct Abdomen Pelvis W Contrast  02/24/2015  CLINICAL DATA:  Abdominal swelling for 4 days. Vomiting. Nausea. Patient is on narcotic medication for chronic pain. Recent ED visit for painful cysts. Abdominal bloating and constipation. EXAM: CT ABDOMEN AND PELVIS WITH CONTRAST TECHNIQUE: Multidetector CT imaging of the abdomen and pelvis was performed using the standard protocol following bolus administration of intravenous contrast. CONTRAST:  186mL OMNIPAQUE IOHEXOL 300 MG/ML  SOLN COMPARISON:  03/24/2010 FINDINGS: Atelectasis in the lung bases. The liver, spleen, gallbladder, pancreas, adrenal glands, kidneys, abdominal aorta, and retroperitoneal lymph nodes are unremarkable. Inferior vena caval filter is in  place. Stomach, small bowel, and colon are not abnormally distended. Stool fills the colon. No  free air or free fluid in the abdomen. Pelvis: Prostate gland is mildly enlarged. Bladder wall is not thickened. Appendix is normal. No free or loculated pelvic fluid collections. No pelvic mass or lymphadenopathy. No destructive bone lesions. IMPRESSION: No acute process demonstrated in the abdomen or pelvis. No evidence of bowel obstruction or inflammation. Scattered stool filled colon. Electronically Signed   By: Lucienne Capers M.D.   On: 02/24/2015 01:23    Micro Results     Recent Results (from the past 240 hour(s))  MRSA PCR Screening     Status: None   Collection Time: 02/24/15 12:47 PM  Result Value Ref Range Status   MRSA by PCR NEGATIVE NEGATIVE Final    Comment:        The GeneXpert MRSA Assay (FDA approved for NASAL specimens only), is one component of a comprehensive MRSA colonization surveillance program. It is not intended to diagnose MRSA infection nor to guide or monitor treatment for MRSA infections.        Today   Subjective:   [name] today has no headache,no chest abdominal pain,no new weakness tingling or numbness, feels much better wants to go home today.  Objective:   Blood pressure 133/79, pulse 85, temperature 98.6 F (37 C), temperature source Oral, resp. rate 20, height 6\' 1"  (1.854 m), weight 85.775 kg (189 lb 1.6 oz), SpO2 98 %.   Intake/Output Summary (Last 24 hours) at 02/25/15 1104 Last data filed at 02/25/15 0900  Gross per 24 hour  Intake    720 ml  Output    200 ml  Net    520 ml    Exam Awake Alert, Oriented x 3, No new F.N deficits, Normal affect [place].AT,PERRAL Supple Neck,No JVD, No cervical lymphadenopathy appriciated.  Symmetrical Chest wall movement, Good air movement bilaterally, CTAB RRR,No Gallops,Rubs or new Murmurs, No Parasternal Heave +ve B.Sounds, Abd Soft, Non tender, No organomegaly appriciated, No rebound -guarding or rigidity. No Cyanosis, Clubbing or edema, No new Rash or bruise  Data Review   CBC  w Diff: Lab Results  Component Value Date   WBC 4.6 02/24/2015   WBC 9.5 12/27/2012   HGB 11.9* 02/24/2015   HGB 11.9* 12/27/2012   HCT 34.5* 02/24/2015   HCT 34.6* 12/27/2012   PLT 228 02/24/2015   PLT 254 12/27/2012   LYMPHOPCT 43 02/23/2015   BANDSPCT PENDING 04/04/2010   MONOPCT 9 02/23/2015   EOSPCT 6 02/23/2015   BASOPCT 1 02/23/2015    CMP: Lab Results  Component Value Date   NA 126* 02/25/2015   NA 124* 12/27/2012   K 4.3 02/25/2015   K 4.0 12/27/2012   CL 91* 02/25/2015   CL 90* 12/27/2012   CO2 29 02/25/2015   CO2 30 12/27/2012   BUN 7 02/25/2015   BUN 8 12/27/2012   CREATININE 0.72 02/25/2015   CREATININE 0.72 12/27/2012   PROT 7.7 02/23/2015   PROT 7.2 12/27/2012   ALBUMIN 4.4 02/23/2015   ALBUMIN 3.7 12/27/2012   BILITOT 0.1* 02/23/2015   BILITOT 0.3 12/27/2012   ALKPHOS 92 02/23/2015   ALKPHOS 110 12/27/2012   AST 22 02/23/2015   AST 11* 12/27/2012   ALT 26 02/23/2015   ALT 20 12/27/2012  .   Total Time in preparing paper work, data evaluation and todays exam - 33 minutes  Faaris Arizpe M.D on 02/25/2015 at 11:04 AM  Note: This dictation was prepared with Dragon dictation along with smaller phrase technology. Any transcriptional errors that result from this process are unintentional.

## 2015-03-23 ENCOUNTER — Ambulatory Visit: Payer: Self-pay | Admitting: Anesthesiology

## 2015-04-07 ENCOUNTER — Emergency Department: Payer: Medicare PPO

## 2015-04-07 ENCOUNTER — Encounter: Payer: Self-pay | Admitting: *Deleted

## 2015-04-07 ENCOUNTER — Emergency Department
Admission: EM | Admit: 2015-04-07 | Discharge: 2015-04-07 | Disposition: A | Discharge status: Home / Self Care | Payer: Medicare PPO | Attending: Emergency Medicine | Admitting: Emergency Medicine

## 2015-04-07 DIAGNOSIS — F17210 Nicotine dependence, cigarettes, uncomplicated: Secondary | ICD-10-CM | POA: Diagnosis not present

## 2015-04-07 DIAGNOSIS — Z79899 Other long term (current) drug therapy: Secondary | ICD-10-CM | POA: Diagnosis not present

## 2015-04-07 DIAGNOSIS — I10 Essential (primary) hypertension: Secondary | ICD-10-CM | POA: Insufficient documentation

## 2015-04-07 DIAGNOSIS — G44219 Episodic tension-type headache, not intractable: Secondary | ICD-10-CM | POA: Diagnosis not present

## 2015-04-07 DIAGNOSIS — R51 Headache: Secondary | ICD-10-CM | POA: Diagnosis present

## 2015-04-07 LAB — CBC
HEMATOCRIT: 36.5 % — AB (ref 40.0–52.0)
HEMOGLOBIN: 12.3 g/dL — AB (ref 13.0–18.0)
MCH: 28.7 pg (ref 26.0–34.0)
MCHC: 33.8 g/dL (ref 32.0–36.0)
MCV: 84.9 fL (ref 80.0–100.0)
Platelets: 293 10*3/uL (ref 150–440)
RBC: 4.3 MIL/uL — ABNORMAL LOW (ref 4.40–5.90)
RDW: 14.8 % — AB (ref 11.5–14.5)
WBC: 8.4 10*3/uL (ref 3.8–10.6)

## 2015-04-07 LAB — BASIC METABOLIC PANEL
ANION GAP: 5 (ref 5–15)
BUN: 10 mg/dL (ref 6–20)
CHLORIDE: 99 mmol/L — AB (ref 101–111)
CO2: 30 mmol/L (ref 22–32)
Calcium: 8.9 mg/dL (ref 8.9–10.3)
Creatinine, Ser: 0.79 mg/dL (ref 0.61–1.24)
GLUCOSE: 128 mg/dL — AB (ref 65–99)
Potassium: 4 mmol/L (ref 3.5–5.1)
Sodium: 134 mmol/L — ABNORMAL LOW (ref 135–145)

## 2015-04-07 MED ORDER — METHYLPREDNISOLONE SODIUM SUCC 125 MG IJ SOLR
125.0000 mg | Freq: Once | INTRAMUSCULAR | Status: AC
  Administered 2015-04-07: 125 mg via INTRAVENOUS
  Filled 2015-04-07: qty 2

## 2015-04-07 MED ORDER — DIPHENHYDRAMINE HCL 50 MG/ML IJ SOLN
50.0000 mg | Freq: Once | INTRAMUSCULAR | Status: AC
  Administered 2015-04-07: 50 mg via INTRAVENOUS
  Filled 2015-04-07: qty 1

## 2015-04-07 MED ORDER — METOCLOPRAMIDE HCL 5 MG/ML IJ SOLN
10.0000 mg | Freq: Once | INTRAMUSCULAR | Status: AC
  Administered 2015-04-07: 10 mg via INTRAVENOUS
  Filled 2015-04-07: qty 2

## 2015-04-07 MED ORDER — METOCLOPRAMIDE HCL 10 MG PO TABS: 10.0000 mg | ORAL_TABLET | Freq: Three times a day (TID) | ORAL | Status: DC

## 2015-04-07 MED ORDER — DIPHENHYDRAMINE HCL 25 MG PO CAPS: 50.0000 mg | ORAL_CAPSULE | Freq: Four times a day (QID) | ORAL | Status: DC | PRN

## 2015-04-07 MED ORDER — KETOROLAC TROMETHAMINE 30 MG/ML IJ SOLN
30.0000 mg | Freq: Once | INTRAMUSCULAR | Status: AC
  Administered 2015-04-07: 30 mg via INTRAVENOUS
  Filled 2015-04-07: qty 1

## 2015-04-07 MED ORDER — SODIUM CHLORIDE 0.9 % IV BOLUS (SEPSIS)
1000.0000 mL | Freq: Once | INTRAVENOUS | Status: AC
  Administered 2015-04-07: 1000 mL via INTRAVENOUS

## 2015-04-07 NOTE — Discharge Instructions (Signed)

## 2015-04-07 NOTE — ED Notes (Signed)
Dr. Stafford at bedside.  

## 2015-04-07 NOTE — ED Notes (Signed)
Pt to ct scan from triage.
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
## 2015-04-07 NOTE — ED Notes (Signed)
Per dr Joni Fears, labs and head ct scan.
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
## 2015-04-30 DIAGNOSIS — R4189 Other symptoms and signs involving cognitive functions and awareness: Secondary | ICD-10-CM | POA: Insufficient documentation

## 2015-05-21 ENCOUNTER — Encounter: Payer: Self-pay | Admitting: Psychiatry

## 2015-05-21 ENCOUNTER — Ambulatory Visit (INDEPENDENT_AMBULATORY_CARE_PROVIDER_SITE_OTHER): Payer: Medicare PPO | Admitting: Psychiatry

## 2015-05-21 VITALS — BP 118/72 | HR 85 | Temp 97.5°F | Ht 73.5 in | Wt 194.8 lb

## 2015-05-21 DIAGNOSIS — F419 Anxiety disorder, unspecified: Secondary | ICD-10-CM | POA: Insufficient documentation

## 2015-05-21 DIAGNOSIS — F39 Unspecified mood [affective] disorder: Secondary | ICD-10-CM

## 2015-05-21 DIAGNOSIS — F331 Major depressive disorder, recurrent, moderate: Secondary | ICD-10-CM | POA: Insufficient documentation

## 2015-05-21 DIAGNOSIS — R569 Unspecified convulsions: Secondary | ICD-10-CM | POA: Insufficient documentation

## 2015-05-21 MED ORDER — QUETIAPINE FUMARATE 100 MG PO TABS: 100.0000 mg | ORAL_TABLET | Freq: Every day | ORAL | Status: DC

## 2015-05-21 MED ORDER — BUSPIRONE HCL 10 MG PO TABS: 15.0000 mg | ORAL_TABLET | Freq: Two times a day (BID) | ORAL | Status: DC
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
## 2015-06-16 ENCOUNTER — Ambulatory Visit (INDEPENDENT_AMBULATORY_CARE_PROVIDER_SITE_OTHER): Payer: Medicare PPO | Admitting: Psychiatry

## 2015-06-16 ENCOUNTER — Encounter: Payer: Self-pay | Admitting: Psychiatry

## 2015-06-16 VITALS — BP 122/78 | HR 76 | Temp 98.7°F | Ht 73.5 in | Wt 194.6 lb

## 2015-06-16 DIAGNOSIS — F39 Unspecified mood [affective] disorder: Secondary | ICD-10-CM | POA: Diagnosis not present

## 2015-06-16 MED ORDER — TRAZODONE HCL 150 MG PO TABS: 150.0000 mg | ORAL_TABLET | Freq: Every day | ORAL | Status: DC

## 2015-06-16 MED ORDER — QUETIAPINE FUMARATE 100 MG PO TABS: 100.0000 mg | ORAL_TABLET | Freq: Every day | ORAL | Status: DC

## 2015-06-16 MED ORDER — CITALOPRAM HYDROBROMIDE 20 MG PO TABS: 20.0000 mg | ORAL_TABLET | Freq: Every day | ORAL | Status: DC

## 2015-06-16 MED ORDER — BUSPIRONE HCL 10 MG PO TABS: 10.0000 mg | ORAL_TABLET | Freq: Three times a day (TID) | ORAL | Status: DC
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
## 2015-07-15 ENCOUNTER — Emergency Department: Payer: Medicare PPO

## 2015-07-15 ENCOUNTER — Encounter: Payer: Self-pay | Admitting: Emergency Medicine

## 2015-07-15 ENCOUNTER — Emergency Department
Admission: EM | Admit: 2015-07-15 | Discharge: 2015-07-16 | Disposition: A | Discharge status: Home / Self Care | Payer: Medicare PPO | Attending: Emergency Medicine | Admitting: Emergency Medicine

## 2015-07-15 DIAGNOSIS — J209 Acute bronchitis, unspecified: Secondary | ICD-10-CM

## 2015-07-15 DIAGNOSIS — F17210 Nicotine dependence, cigarettes, uncomplicated: Secondary | ICD-10-CM | POA: Diagnosis not present

## 2015-07-15 DIAGNOSIS — Z8669 Personal history of other diseases of the nervous system and sense organs: Secondary | ICD-10-CM | POA: Diagnosis not present

## 2015-07-15 DIAGNOSIS — F331 Major depressive disorder, recurrent, moderate: Secondary | ICD-10-CM | POA: Diagnosis not present

## 2015-07-15 DIAGNOSIS — Z79899 Other long term (current) drug therapy: Secondary | ICD-10-CM | POA: Insufficient documentation

## 2015-07-15 DIAGNOSIS — R05 Cough: Secondary | ICD-10-CM | POA: Diagnosis present

## 2015-07-15 DIAGNOSIS — I10 Essential (primary) hypertension: Secondary | ICD-10-CM | POA: Insufficient documentation

## 2015-07-15 DIAGNOSIS — Z792 Long term (current) use of antibiotics: Secondary | ICD-10-CM | POA: Insufficient documentation

## 2015-07-15 MED ORDER — IPRATROPIUM-ALBUTEROL 0.5-2.5 (3) MG/3ML IN SOLN
3.0000 mL | Freq: Once | RESPIRATORY_TRACT | Status: AC
  Administered 2015-07-15: 3 mL via RESPIRATORY_TRACT

## 2015-07-15 MED ORDER — HYDROCOD POLST-CPM POLST ER 10-8 MG/5ML PO SUER
5.0000 mL | Freq: Once | ORAL | Status: AC
  Administered 2015-07-15: 5 mL via ORAL
  Filled 2015-07-15: qty 5

## 2015-07-15 MED ORDER — IPRATROPIUM-ALBUTEROL 0.5-2.5 (3) MG/3ML IN SOLN
3.0000 mL | Freq: Once | RESPIRATORY_TRACT | Status: AC
  Administered 2015-07-15: 3 mL via RESPIRATORY_TRACT
  Filled 2015-07-15: qty 3

## 2015-07-15 MED ORDER — PREDNISONE 20 MG PO TABS
60.0000 mg | ORAL_TABLET | Freq: Once | ORAL | Status: AC
  Administered 2015-07-15: 60 mg via ORAL
  Filled 2015-07-15: qty 3

## 2015-07-15 NOTE — ED Provider Notes (Signed)
[facility] Emergency Department Provider Note  ____________________________________________  Time seen: Approximately 11:08 PM  I have reviewed the triage vital signs and the nursing notes.   HISTORY  Chief Complaint Cough   HPI [name] is a 45 y.o. male who presents to the emergency department for evaluation of cough, wheezing, and knee pain. Cough and wheezing has been present for 5 days. Knee pain is acute on chronic and has been more painful for about a week. He has not taken anything for symptoms.   Past Medical History  Diagnosis Date  . Chronic pain   . Poor historian   . Reflux   . Head injury   . Hypertension   . Anxiety   . Seizures ([place])   . Insomnia     Patient Active Problem List   Diagnosis Date Noted  . Anxiety 05/21/2015  . Moderate episode of recurrent major depressive disorder ([place]) 05/21/2015  . Seizure ([place]) 05/21/2015  . Cognitive decline 04/30/2015  . Abdominal distension 02/24/2015  . Hyponatremia 02/24/2015  . HTN (hypertension) 02/24/2015  . Hx of traumatic brain injury 02/24/2015  . HTN (hypertension), benign 02/24/2015  . Abdominal distension (gaseous) 02/24/2015  . Essential (primary) hypertension 02/24/2015  . Hypo-osmolality and hyponatremia 02/24/2015  . Personal history of traumatic brain injury 02/24/2015  . Insomnia, persistent 01/09/2015    Past Surgical History  Procedure Laterality Date  . Abdominal surgery    . Brain surgery      Current Outpatient Rx  Name  Route  Sig  Dispense  Refill  . albuterol (PROVENTIL HFA;VENTOLIN HFA) 108 (90 Base) MCG/ACT inhaler   Inhalation   Inhale 2 puffs into the lungs every 6 (six) hours as needed for wheezing or shortness of breath.   1 Inhaler   2   . busPIRone (BUSPAR) 10 MG tablet   Oral   Take 1 tablet (10 mg total) by mouth 3 (three) times daily.   90 tablet   1   . citalopram (CELEXA) 20 MG tablet   Oral   Take 1 tablet (20 mg total) by  mouth daily.   30 tablet   1   . guaiFENesin-codeine (ROBITUSSIN AC) 100-10 MG/5ML syrup   Oral   Take 5 mLs by mouth 3 (three) times daily as needed for cough.   120 mL   0   . lamoTRIgine (LAMICTAL) 25 MG tablet   Oral   Take by mouth.         . levETIRAcetam (KEPPRA) 500 MG tablet   Oral   Take 500 mg by mouth daily.         Marland Kitchen levofloxacin (LEVAQUIN) 750 MG tablet   Oral   Take 1 tablet (750 mg total) by mouth daily.   7 tablet   0   . lisinopril (PRINIVIL,ZESTRIL) 10 MG tablet   Oral   Take 10 mg by mouth daily.         . methocarbamol (ROBAXIN) 500 MG tablet   Oral   Take 500 mg by mouth daily at 6 (six) AM.         . omeprazole (PRILOSEC) 20 MG capsule   Oral   Take 40 mg by mouth every morning.         . predniSONE (DELTASONE) 10 MG tablet   Oral   Take 5 tablets (50 mg total) by mouth daily.   25 tablet   0   . QUEtiapine (SEROQUEL) 100 MG tablet  Oral   Take 1 tablet (100 mg total) by mouth at bedtime.   30 tablet   1   . senna (SENOKOT) 8.6 MG TABS tablet   Oral   Take 1 tablet (8.6 mg total) by mouth 2 (two) times daily.   120 each   0   . traZODone (DESYREL) 150 MG tablet   Oral   Take 1 tablet (150 mg total) by mouth at bedtime.   30 tablet   1     Allergies Amitriptyline  Family History  Problem Relation Age of Onset  . Cancer Mother   . Cancer Father   . Alcohol abuse Father   . Schizophrenia Brother     Social History Social History  Substance Use Topics  . Smoking status: Current Every Day Smoker -- 1.00 packs/day    Types: Cigarettes    Start date: 05/21/1990  . Smokeless tobacco: Former Systems developer    Quit date: 05/20/1985  . Alcohol Use: 1.2 oz/week    0 Glasses of wine, 2 Shots of liquor, 0 Standard drinks or equivalent, 0 Cans of beer per week     Comment: a couple 6 packs beer daily    Review of Systems Constitutional: Negative for fever/chills ENT: Positive for sore throat. Cardiovascular: Denies  chest pain. Respiratory: Occasional shortness of breath. Positive for cough. Gastrointestinal: Negative for nausea,  no vomiting.  no diarrhea.  Musculoskeletal: Positive for body aches Skin: Negative for rash. Neurological: Negative for headaches ____________________________________________   PHYSICAL EXAM:  VITAL SIGNS: ED Triage Vitals  Enc Vitals Group     BP 07/15/15 2117 148/85 mmHg     Pulse Rate 07/15/15 2117 91     Resp 07/15/15 2117 18     Temp 07/15/15 2117 97.7 F (36.5 C)     Temp Source 07/15/15 2117 Oral     SpO2 07/15/15 2117 95 %     Weight 07/15/15 2117 200 lb (90.719 kg)     Height 07/15/15 2117 6\' 1"  (1.854 m)     Head Cir --      Peak Flow --      Pain Score 07/15/15 2118 0     Pain Loc --      Pain Edu? --      Excl. in [place]? --     Constitutional: Alert and oriented. Well appearing and in no acute distress. Eyes: Conjunctivae are normal. EOMI. Ears: Bilateral TM normal in appearance. Nose: No congestion; no rhinnorhea. Mouth/Throat: Mucous membranes are moist.  Oropharynx normal. Tonsils appear normal. Neck: No stridor.  Lymphatic: No cervical lymphadenopathy. Cardiovascular: Normal rate, regular rhythm. Grossly normal heart sounds.  Good peripheral circulation. Respiratory: Normal respiratory effort.  No retractions. Rhonchi and wheezing throughout. Gastrointestinal: Soft and nontender.  Musculoskeletal: FROM x 4 extremities.  Neurologic:  Normal speech and language.  Skin:  Skin is warm, dry and intact. No rash noted. Psychiatric: Mood and affect are normal. Speech and behavior are normal.  ____________________________________________   LABS (all labs ordered are listed, but only abnormal results are displayed)  Labs Reviewed - No data to display ____________________________________________  EKG   ____________________________________________  RADIOLOGY  Emphysematous changes and chronic bronchitic changes in the  lungs. ____________________________________________   PROCEDURES  Procedure(s) performed: None  Critical Care performed: No  ____________________________________________   INITIAL IMPRESSION / ASSESSMENT AND PLAN / ED COURSE  Pertinent labs & imaging results that were available during my care of the patient were reviewed by me and considered  in my medical decision making (see chart for details).   Much improved after 2 DuoNeb treatments. Patient to be discharged home with Levaquin, Prednisone, Robitussin AC, and Albuterol. He was advised to follow up with his PCP or return to the ER for symptoms that change or worsen or new concerns.  ____________________________________________   FINAL CLINICAL IMPRESSION(S) / ED DIAGNOSES  Final diagnoses:  Bronchitis, acute, with bronchospasm       Victorino Dike, FNP 07/16/15 F[place], FNP 07/16/15 [id number]  Hinda Kehr, MD 07/16/15 [id number]

## 2015-07-15 NOTE — ED Notes (Signed)
Patient transported to Xray, will administer meds once returns

## 2015-07-15 NOTE — ED Notes (Signed)
C/O cough x 5 days.  C/o generally body aches and head pain with cough.

## 2015-07-16 ENCOUNTER — Ambulatory Visit: Payer: Medicare PPO | Admitting: Psychiatry

## 2015-07-16 MED ORDER — ALBUTEROL SULFATE HFA 108 (90 BASE) MCG/ACT IN AERS: 2.0000 | INHALATION_SPRAY | Freq: Four times a day (QID) | RESPIRATORY_TRACT | Status: DC | PRN

## 2015-07-16 MED ORDER — PREDNISONE 10 MG PO TABS: 50.0000 mg | ORAL_TABLET | Freq: Every day | ORAL | Status: DC

## 2015-07-16 MED ORDER — LEVOFLOXACIN 750 MG PO TABS: 750.0000 mg | ORAL_TABLET | Freq: Every day | ORAL | Status: AC

## 2015-07-16 MED ORDER — GUAIFENESIN-CODEINE 100-10 MG/5ML PO SYRP
5.0000 mL | ORAL_SOLUTION | Freq: Three times a day (TID) | ORAL | Status: DC | PRN
Start: 2015-07-16 — End: 2015-09-24

## 2015-07-16 MED ORDER — IPRATROPIUM-ALBUTEROL 0.5-2.5 (3) MG/3ML IN SOLN
RESPIRATORY_TRACT | Status: AC
  Filled 2015-07-16: qty 3

## 2015-07-16 NOTE — Discharge Instructions (Signed)

## 2015-07-22 ENCOUNTER — Ambulatory Visit (INDEPENDENT_AMBULATORY_CARE_PROVIDER_SITE_OTHER): Payer: Medicare PPO | Admitting: Psychiatry

## 2015-07-22 ENCOUNTER — Encounter: Payer: Self-pay | Admitting: Psychiatry

## 2015-07-22 ENCOUNTER — Telehealth: Payer: Self-pay

## 2015-07-22 VITALS — BP 118/80 | HR 58 | Temp 97.9°F | Ht 73.0 in | Wt 205.4 lb

## 2015-07-22 DIAGNOSIS — F39 Unspecified mood [affective] disorder: Secondary | ICD-10-CM

## 2015-07-22 MED ORDER — QUETIAPINE FUMARATE 100 MG PO TABS: 100.0000 mg | ORAL_TABLET | Freq: Two times a day (BID) | ORAL | Status: DC

## 2015-07-22 MED ORDER — TRAZODONE HCL 150 MG PO TABS: 150.0000 mg | ORAL_TABLET | Freq: Every day | ORAL | Status: DC

## 2015-07-22 MED ORDER — CITALOPRAM HYDROBROMIDE 20 MG PO TABS: 20.0000 mg | ORAL_TABLET | Freq: Every day | ORAL | Status: DC

## 2015-07-22 MED ORDER — BENZTROPINE MESYLATE 1 MG PO TABS: 1.0000 mg | ORAL_TABLET | Freq: Two times a day (BID) | ORAL | Status: DC

## 2015-07-22 NOTE — Telephone Encounter (Signed)
pt mother called states that patient was already on trazodone 150mg  and that she states that you was suppose to increase medication.

## 2015-07-22 NOTE — Progress Notes (Signed)
Psychiatric MD Progress Note   Patient Identification: [name] MRN:  [id number] Date of Evaluation:  07/22/2015 Referral Source: [facility] Chief Complaint:   Chief Complaint    Follow-up; Medication Refill; Medication Problem     Visit Diagnosis:    ICD-9-CM ICD-10-CM   1. Episodic mood disorder ([place]) 296.90 F39    Diagnosis:   Patient Active Problem List   Diagnosis Date Noted  . Anxiety [F41.9] 05/21/2015  . Moderate episode of recurrent major depressive disorder ([place]) [F33.1] 05/21/2015  . Seizure ([place]) [R56.9] 05/21/2015  . Cognitive decline [R41.89] 04/30/2015  . Abdominal distension [R14.0] 02/24/2015  . Hyponatremia [E87.1] 02/24/2015  . HTN (hypertension) [I10] 02/24/2015  . Hx of traumatic brain injury [Z87.820] 02/24/2015  . HTN (hypertension), benign [I10] 02/24/2015  . Abdominal distension (gaseous) [R14.0] 02/24/2015  . Essential (primary) hypertension [I10] 02/24/2015  . Hypo-osmolality and hyponatremia [E87.1] 02/24/2015  . Personal history of traumatic brain injury [Z87.820] 02/24/2015  . Insomnia, persistent [G47.00] 01/09/2015   History of Present Illness:    Patient is a 45 year old white male who presented  for the follow-up. He was accompanied by his mother. He Reported that he has been having been in his money and was given prednisone and he has recently completed the course. Patient reported that he continues to have worsened anxiety and feels that the BuSpar is not helping him. He wants to stop the medication. He reported that he has been taking Seroquel and has been experiencing restless legs from the same. He wants to increase the dose of Seroquel and take it during the daytime as he has tried it in the past. Patient reported that he is sleeping well with the help of trazodone. His mother reported that he has been doing well since he was switched to lamotrigine. He is not experiencing any side effects from the Lamictal. Patient appeared  restless and anxious during the interview. He reported that he wants his medications to be adjusted at this time. He currently denied having any suicidal homicidal ideations or plans.   He currently denied having any suicidal homicidal ideations or plans. He currently denied having any perceptual disturbances    He continues to have problems with his memory   Past Medical History:  Past Medical History  Diagnosis Date  . Chronic pain   . Poor historian   . Reflux   . Head injury   . Hypertension   . Anxiety   . Seizures ([place])   . Insomnia     Past Surgical History  Procedure Laterality Date  . Abdominal surgery    . Brain surgery     Family History:  Family History  Problem Relation Age of Onset  . Cancer Mother   . Cancer Father   . Alcohol abuse Father   . Schizophrenia Brother    Social History:   Social History   Social History  . Marital Status: Single    Spouse Name: N/A  . Number of Children: N/A  . Years of Education: N/A   Social History Main Topics  . Smoking status: Current Every Day Smoker -- 1.00 packs/day    Types: Cigarettes    Start date: 05/21/1990  . Smokeless tobacco: Former Systems developer    Quit date: 05/20/1985  . Alcohol Use: 1.2 oz/week    0 Glasses of wine, 2 Shots of liquor, 0 Standard drinks or equivalent, 0 Cans of beer per week     Comment: a couple 6 packs beer daily  .  Drug Use: No     Comment: occ  . Sexual Activity: Not Currently   Other Topics Concern  . None   Social History Narrative   ** Merged History Encounter **       Additional Social History:  Currently lives with his mother who is very supportive. 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 He currently denied any pending legal charges. He denied using any drugs or alcohol at this time  Musculoskeletal: Strength & Muscle Tone: within normal limits Gait & Station: normal Patient leans:  N/A  Psychiatric Specialty Exam: Anxiety    Depression        Past medical history includes anxiety.     Review of Systems  Psychiatric/Behavioral: Positive for depression.    Blood pressure 118/80, pulse 58, temperature 97.9 F (36.6 C), temperature source Tympanic, height 6\' 1"  (1.854 m), weight 205 lb 6.4 oz (93.169 kg), SpO2 95 %.Body mass index is 27.11 kg/(m^2).  General Appearance: Casual  Eye Contact:  Fair  Speech:  Clear and Coherent  Volume:  Normal  Mood:  Anxious  Affect:  Depressed  Thought Process:  Circumstantial  Orientation:  Full (Time, Place, and Person)  Thought Content:  WDL  Suicidal Thoughts:  No  Homicidal Thoughts:  No  Memory:  imparied  Judgement:  Impaired  Insight:  Lacking  Psychomotor Activity:  Normal  Concentration:  Fair  Recall:  [employer] of Knowledge:Fair  Language: Fair  Akathisia:  No  Handed:  Right  AIMS (if indicated):    Assets:  Desire for Improvement Financial Resources/Insurance Social Support  ADL's:  Intact  Cognition: WNL  Sleep:  fair   Is the patient at risk to self?  No. Has the patient been a risk to self in the past 6 months?  No. Has the patient been a risk to self within the distant past?  No. Is the patient a risk to others?  No. Has the patient been a risk to others in the past 6 months?  No. Has the patient been a risk to others within the distant past?  No.  Allergies:   Allergies  Allergen Reactions  . Amitriptyline Other (See Comments)    Paralysis of pt    Current Medications: Current Outpatient Prescriptions  Medication Sig Dispense Refill  . albuterol (PROVENTIL HFA;VENTOLIN HFA) 108 (90 Base) MCG/ACT inhaler Inhale 2 puffs into the lungs every 6 (six) hours as needed for wheezing or shortness of breath. 1 Inhaler 2  . busPIRone (BUSPAR) 10 MG tablet Take 1 tablet (10 mg total) by mouth 3 (three) times daily. 90 tablet 1  . citalopram (CELEXA) 20 MG tablet Take 1 tablet (20 mg total) by  mouth daily. 30 tablet 1  . guaiFENesin-codeine (ROBITUSSIN AC) 100-10 MG/5ML syrup Take 5 mLs by mouth 3 (three) times daily as needed for cough. 120 mL 0  . lamoTRIgine (LAMICTAL) 25 MG tablet Take by mouth.    . levETIRAcetam (KEPPRA) 500 MG tablet Take 500 mg by mouth daily.    Marland Kitchen levofloxacin (LEVAQUIN) 750 MG tablet Take 1 tablet (750 mg total) by mouth daily. 7 tablet 0  . lisinopril (PRINIVIL,ZESTRIL) 10 MG tablet Take 10 mg by mouth daily.    . methocarbamol (ROBAXIN) 500 MG tablet Take 500 mg by mouth daily at 6 (six) AM.    . omeprazole (PRILOSEC) 20 MG capsule Take  40 mg by mouth every morning.    . predniSONE (DELTASONE) 10 MG tablet Take 5 tablets (50 mg total) by mouth daily. 25 tablet 0  . QUEtiapine (SEROQUEL) 100 MG tablet Take 1 tablet (100 mg total) by mouth at bedtime. 30 tablet 1  . senna (SENOKOT) 8.6 MG TABS tablet Take 1 tablet (8.6 mg total) by mouth 2 (two) times daily. 120 each 0  . traZODone (DESYREL) 150 MG tablet Take 1 tablet (150 mg total) by mouth at bedtime. 30 tablet 1  . traZODone (DESYREL) 100 MG tablet      No current facility-administered medications for this visit.    Previous Psychotropic Medications: He is currently taking BuSpar and trazodone Celexa. He is also on Tegretol for his seizure disorder  Substance Abuse History in the last 12 months:  Yes.    Beer - last month   Consequences of Substance Abuse: Legal Consequences:  spent most of my life in prison  Medical Decision Making:  Review of Psycho-Social Stressors (1) and Review and summation of old records (2)  Treatment Plan Summary: Medication management     Discontinue BuSpar He will continue on Celexa 20 mg daily He was continue on Seroquel 100 mg by mouth twice a day Start him on Cogentin 1 mg by mouth twice a day and advised him to take it as needed when necessary Continue trazodone 150 mg by mouth daily at bedtime  He will  follow-up in one month   More than 50% of the  time spent in psychoeducation, counseling and coordination of care.      This note was generated in part or whole with voice recognition software. Voice regonition is usually quite accurate but there are transcription errors that can and very often do occur. I apologize for any typographical errors that were not detected and corrected.    Rainey Pines, MD  5/10/20172:04 PM

## 2015-07-27 NOTE — Telephone Encounter (Signed)
We did not discuss about Trazodone dose. Increased Seroquel 100mg  BID and added Cogentin during his appointment.

## 2015-07-27 NOTE — Telephone Encounter (Signed)
was left a message with the instructions

## 2015-08-07 ENCOUNTER — Telehealth: Payer: Self-pay

## 2015-08-07 NOTE — Telephone Encounter (Signed)
mother called stated that pt is not doing well on the seroquel.  pt has no energy and is staying in the bed.  offer her an appt for today but she stated that she is at the hospital with her daughter.  I did make an appt for next Wednesday but she would like to know what to do in the mean time.

## 2015-08-12 ENCOUNTER — Ambulatory Visit (INDEPENDENT_AMBULATORY_CARE_PROVIDER_SITE_OTHER): Payer: Medicare PPO | Admitting: Psychiatry

## 2015-08-12 ENCOUNTER — Encounter: Payer: Self-pay | Admitting: Psychiatry

## 2015-08-12 VITALS — BP 118/78 | HR 83 | Temp 97.0°F | Ht 73.0 in | Wt 196.2 lb

## 2015-08-12 DIAGNOSIS — F39 Unspecified mood [affective] disorder: Secondary | ICD-10-CM | POA: Diagnosis not present

## 2015-08-12 MED ORDER — QUETIAPINE FUMARATE 100 MG PO TABS: 100.0000 mg | ORAL_TABLET | Freq: Every day | ORAL | Status: DC

## 2015-08-12 MED ORDER — AMANTADINE HCL 100 MG PO CAPS: 100.0000 mg | ORAL_CAPSULE | Freq: Every day | ORAL | Status: DC

## 2015-08-12 NOTE — Progress Notes (Signed)
Psychiatric MD Progress Note   Patient Identification: [name] MRN:  [id number] Date of Evaluation:  08/12/2015 Referral Source: [facility] Chief Complaint:   Chief Complaint    Follow-up; Medication Refill     Visit Diagnosis:    ICD-9-CM ICD-10-CM   1. Episodic mood disorder ([place]) 296.90 F39    Diagnosis:   Patient Active Problem List   Diagnosis Date Noted  . Anxiety [F41.9] 05/21/2015  . Moderate episode of recurrent major depressive disorder ([place]) [F33.1] 05/21/2015  . Seizure ([place]) [R56.9] 05/21/2015  . Cognitive decline [R41.89] 04/30/2015  . Abdominal distension [R14.0] 02/24/2015  . Hyponatremia [E87.1] 02/24/2015  . HTN (hypertension) [I10] 02/24/2015  . Hx of traumatic brain injury [Z87.820] 02/24/2015  . HTN (hypertension), benign [I10] 02/24/2015  . Abdominal distension (gaseous) [R14.0] 02/24/2015  . Essential (primary) hypertension [I10] 02/24/2015  . Hypo-osmolality and hyponatremia [E87.1] 02/24/2015  . Personal history of traumatic brain injury [Z87.820] 02/24/2015  . Insomnia, persistent [G47.00] 01/09/2015   History of Present Illness:    Patient is a 45 year old white male who presented  for the follow-up. He was accompanied by his mother. She reported that patient was getting few rashes from the lamotrigine and she was concerned about the same. I evaluated the patient and did not find any rash on him. Patient reported that he is having dryness of his mouth and blurring of the lesion related to the Cogentin And he wants to discontinue the medication. His mother also reported that she has decreased the dose of Seroquel to 100 mg at bedtime as he was very tired and lethargic throughout the day. He is feeling better since yesterday. Patient wants to change to another medication as he feels that the Seroquel is making him very tired throughout the day. We discussed about the medications and he agreed with the plan that he will try 100 mg for the next  2 weeks and if he continues to be tired and will adjust to a different medication. I will also try him on a different medication instead of the Cogentin. Patient appeared anxious during the interview. He currently denied having any mood swings anger or anxiety.  He denied having any suicidal homicidal ideations or plans at this time. His mother remains supportive.   He continues to have problems with his memory   Past Medical History:  Past Medical History  Diagnosis Date  . Chronic pain   . Poor historian   . Reflux   . Head injury   . Hypertension   . Anxiety   . Seizures ([place])   . Insomnia     Past Surgical History  Procedure Laterality Date  . Abdominal surgery    . Brain surgery     Family History:  Family History  Problem Relation Age of Onset  . Cancer Mother   . Cancer Father   . Alcohol abuse Father   . Schizophrenia Brother    Social History:   Social History   Social History  . Marital Status: Single    Spouse Name: N/A  . Number of Children: N/A  . Years of Education: N/A   Social History Main Topics  . Smoking status: Current Every Day Smoker -- 1.00 packs/day    Types: Cigarettes    Start date: 05/21/1990  . Smokeless tobacco: Former Systems developer    Quit date: 05/20/1985  . Alcohol Use: 1.2 oz/week    0 Glasses of wine, 2 Shots of liquor, 0 Standard drinks or equivalent,  0 Cans of beer per week     Comment: a couple 6 packs beer daily  . Drug Use: No     Comment: occ  . Sexual Activity: Not Currently   Other Topics Concern  . None   Social History Narrative   ** Merged History Encounter **       Additional Social History:  Currently lives with his mother who is very supportive. 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 He currently denied any pending legal charges. He denied using any drugs or alcohol at this time  Musculoskeletal: Strength & Muscle Tone: within normal  limits Gait & Station: normal Patient leans: N/A  Psychiatric Specialty Exam: Anxiety    Depression        Past medical history includes anxiety.     Review of Systems  Psychiatric/Behavioral: Positive for depression.    Blood pressure 118/78, pulse 83, temperature 97 F (36.1 C), temperature source Tympanic, height 6\' 1"  (1.854 m), weight 196 lb 3.2 oz (88.996 kg), SpO2 95 %.Body mass index is 25.89 kg/(m^2).  General Appearance: Casual  Eye Contact:  Fair  Speech:  Clear and Coherent  Volume:  Normal  Mood:  Anxious  Affect:  Depressed  Thought Process:  Circumstantial  Orientation:  Full (Time, Place, and Person)  Thought Content:  WDL  Suicidal Thoughts:  No  Homicidal Thoughts:  No  Memory:  imparied  Judgement:  Impaired  Insight:  Lacking  Psychomotor Activity:  Normal  Concentration:  Fair  Recall:  [employer] of Knowledge:Fair  Language: Fair  Akathisia:  No  Handed:  Right  AIMS (if indicated):    Assets:  Desire for Improvement Financial Resources/Insurance Social Support  ADL's:  Intact  Cognition: WNL  Sleep:  fair   Is the patient at risk to self?  No. Has the patient been a risk to self in the past 6 months?  No. Has the patient been a risk to self within the distant past?  No. Is the patient a risk to others?  No. Has the patient been a risk to others in the past 6 months?  No. Has the patient been a risk to others within the distant past?  No.  Allergies:   Allergies  Allergen Reactions  . Amitriptyline Other (See Comments)    Paralysis of pt    Current Medications: Current Outpatient Prescriptions  Medication Sig Dispense Refill  . albuterol (PROVENTIL HFA;VENTOLIN HFA) 108 (90 Base) MCG/ACT inhaler Inhale 2 puffs into the lungs every 6 (six) hours as needed for wheezing or shortness of breath. 1 Inhaler 2  . benztropine (COGENTIN) 1 MG tablet Take 1 tablet (1 mg total) by mouth 2 (two) times daily. 60 tablet 1  . citalopram (CELEXA)  20 MG tablet Take 1 tablet (20 mg total) by mouth daily. 30 tablet 1  . guaiFENesin-codeine (ROBITUSSIN AC) 100-10 MG/5ML syrup Take 5 mLs by mouth 3 (three) times daily as needed for cough. 120 mL 0  . lamoTRIgine (LAMICTAL) 25 MG tablet Take by mouth.    . levETIRAcetam (KEPPRA) 500 MG tablet Take 500 mg by mouth daily.    Marland Kitchen lisinopril (PRINIVIL,ZESTRIL) 10 MG tablet Take 10 mg by mouth daily.    . methocarbamol (ROBAXIN) 500 MG tablet Take 500 mg by mouth daily at 6 (six) AM.    . omeprazole (PRILOSEC) 20 MG capsule Take  40 mg by mouth every morning.    . predniSONE (DELTASONE) 10 MG tablet Take 5 tablets (50 mg total) by mouth daily. 25 tablet 0  . QUEtiapine (SEROQUEL) 100 MG tablet Take 1 tablet (100 mg total) by mouth 2 (two) times daily. 60 tablet 1  . senna (SENOKOT) 8.6 MG TABS tablet Take 1 tablet (8.6 mg total) by mouth 2 (two) times daily. 120 each 0  . traZODone (DESYREL) 150 MG tablet Take 1 tablet (150 mg total) by mouth at bedtime. 30 tablet 1   No current facility-administered medications for this visit.    Previous Psychotropic Medications: He is currently taking BuSpar and trazodone Celexa. He is also on Tegretol for his seizure disorder  Substance Abuse History in the last 12 months:  Yes.    Beer - last month   Consequences of Substance Abuse: Legal Consequences:  spent most of my life in prison  Medical Decision Making:  Review of Psycho-Social Stressors (1) and Review and summation of old records (2)  Treatment Plan Summary: Medication management     Discontinue BuSpar He will continue on Celexa 20 mg daily He was continue on Seroquel 100 mg by mouth qhs- he has supply. I will start him on amantadine 100 mg daily at bedtime for restless legs. Continue trazodone 150 mg by mouth daily at bedtime  He will  follow-up in 2 weeks    More than 50% of the time spent in psychoeducation, counseling and coordination of care.      This note was generated in  part or whole with voice recognition software. Voice regonition is usually quite accurate but there are transcription errors that can and very often do occur. I apologize for any typographical errors that were not detected and corrected.    Rainey Pines, MD  5/31/20178:51 AM

## 2015-08-19 ENCOUNTER — Ambulatory Visit: Payer: Medicare PPO | Admitting: Psychiatry

## 2015-08-25 ENCOUNTER — Ambulatory Visit (INDEPENDENT_AMBULATORY_CARE_PROVIDER_SITE_OTHER): Payer: Medicare PPO | Admitting: Psychiatry

## 2015-08-25 ENCOUNTER — Encounter: Payer: Self-pay | Admitting: Psychiatry

## 2015-08-25 VITALS — BP 118/77 | HR 70 | Ht 73.0 in | Wt 193.8 lb

## 2015-08-25 DIAGNOSIS — F39 Unspecified mood [affective] disorder: Secondary | ICD-10-CM

## 2015-08-25 MED ORDER — TRAZODONE HCL 150 MG PO TABS: 150.0000 mg | ORAL_TABLET | Freq: Every day | ORAL | Status: DC

## 2015-08-25 MED ORDER — AMANTADINE HCL 100 MG PO CAPS: 100.0000 mg | ORAL_CAPSULE | Freq: Every day | ORAL | Status: DC

## 2015-08-25 MED ORDER — ZIPRASIDONE HCL 40 MG PO CAPS: 40.0000 mg | ORAL_CAPSULE | Freq: Every day | ORAL | Status: DC

## 2015-08-25 MED ORDER — CITALOPRAM HYDROBROMIDE 20 MG PO TABS: 20.0000 mg | ORAL_TABLET | Freq: Every day | ORAL | Status: DC

## 2015-08-25 NOTE — Progress Notes (Signed)
Psychiatric MD Progress Note   Patient Identification: [name] MRN:  [id number] Date of Evaluation:  08/25/2015 Referral Source: [facility] Chief Complaint:   Chief Complaint    Follow-up; Medication Refill     Visit Diagnosis:    ICD-9-CM ICD-10-CM   1. Episodic mood disorder ([place]) 296.90 F39    Diagnosis:   Patient Active Problem List   Diagnosis Date Noted  . Anxiety [F41.9] 05/21/2015  . Moderate episode of recurrent major depressive disorder ([place]) [F33.1] 05/21/2015  . Seizure ([place]) [R56.9] 05/21/2015  . Cognitive decline [R41.89] 04/30/2015  . Abdominal distension [R14.0] 02/24/2015  . Hyponatremia [E87.1] 02/24/2015  . HTN (hypertension) [I10] 02/24/2015  . Hx of traumatic brain injury [Z87.820] 02/24/2015  . HTN (hypertension), benign [I10] 02/24/2015  . Abdominal distension (gaseous) [R14.0] 02/24/2015  . Essential (primary) hypertension [I10] 02/24/2015  . Hypo-osmolality and hyponatremia [E87.1] 02/24/2015  . Personal history of traumatic brain injury [Z87.820] 02/24/2015  . Insomnia, persistent [G47.00] 01/09/2015   History of Present Illness:    Patient is a 45 year old white male who presented  for the follow-up. He was accompanied by his mother.She reported that he continues to have dryness of the mouth which is bothersome and he has stopped taking the amantadine which was prescribed at last appointment. He reported that he cannot tolerate the dryness and wants to change his medication. We discussed that the tiredness is related to the Seroquel. He reported that he does not feel well and had to have his medications adjusted at this time. He reported that he occasionally takes trazodone during the daytime as he has nothing to do. His mother has been fixing his medication and he has a pillbox. Patient reported that he feels ill described as being agitated. He reported that he does not get along well with other people. His mother reported that his symptoms  are improving and he is responding well to the lamotrigine which was prescribed by his neurologist. He currently denied having any suicidal ideations or plans. He denied having any perceptual disturbances.  Patient stated that he has been compliant with his medications and his mother is very supportive at this time. She wants him to start driving again. We discussed about the medication options in detail and he demonstrated understanding.     Past Medical History:  Past Medical History  Diagnosis Date  . Chronic pain   . Poor historian   . Reflux   . Head injury   . Hypertension   . Anxiety   . Seizures ([place])   . Insomnia     Past Surgical History  Procedure Laterality Date  . Abdominal surgery    . Brain surgery     Family History:  Family History  Problem Relation Age of Onset  . Cancer Mother   . Cancer Father   . Alcohol abuse Father   . Schizophrenia Brother    Social History:   Social History   Social History  . Marital Status: Single    Spouse Name: N/A  . Number of Children: N/A  . Years of Education: N/A   Social History Main Topics  . Smoking status: Current Every Day Smoker -- 1.00 packs/day    Types: Cigarettes    Start date: 05/21/1990  . Smokeless tobacco: Former Systems developer    Quit date: 05/20/1985  . Alcohol Use: 1.2 oz/week    0 Glasses of wine, 2 Shots of liquor, 0 Standard drinks or equivalent, 0 Cans of beer per week  Comment: a couple 6 packs beer daily  . Drug Use: No     Comment: occ  . Sexual Activity: Not Currently   Other Topics Concern  . None   Social History Narrative   ** Merged History Encounter **       Additional Social History:  Currently lives with his mother who is very supportive. 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 He currently denied any pending legal charges. He denied using any drugs or alcohol at this time  Musculoskeletal: Strength &  Muscle Tone: within normal limits Gait & Station: normal Patient leans: N/A  Psychiatric Specialty Exam: Anxiety    Depression        Past medical history includes anxiety.     Review of Systems  Psychiatric/Behavioral: Positive for depression.    Blood pressure 118/77, pulse 70, height 6\' 1"  (1.854 m), weight 193 lb 12.8 oz (87.907 kg).Body mass index is 25.57 kg/(m^2).  General Appearance: Casual  Eye Contact:  Fair  Speech:  Clear and Coherent  Volume:  Normal  Mood:  Anxious  Affect:  Depressed  Thought Process:  Goal Directed  Orientation:  Full (Time, Place, and Person)  Thought Content:  WDL  Suicidal Thoughts:  No  Homicidal Thoughts:  No  Memory:  imparied  Judgement:  Impaired  Insight:  Lacking  Psychomotor Activity:  Normal  Concentration:  Fair  Recall:  [employer] of Knowledge:Fair  Language: Fair  Akathisia:  No  Handed:  Right  AIMS (if indicated):    Assets:  Desire for Improvement Financial Resources/Insurance Social Support  ADL's:  Intact  Cognition: WNL  Sleep:  fair   Is the patient at risk to self?  No. Has the patient been a risk to self in the past 6 months?  No. Has the patient been a risk to self within the distant past?  No. Is the patient a risk to others?  No. Has the patient been a risk to others in the past 6 months?  No. Has the patient been a risk to others within the distant past?  No.  Allergies:   Allergies  Allergen Reactions  . Amitriptyline Other (See Comments)    Paralysis of pt    Current Medications: Current Outpatient Prescriptions  Medication Sig Dispense Refill  . albuterol (PROVENTIL HFA;VENTOLIN HFA) 108 (90 Base) MCG/ACT inhaler Inhale 2 puffs into the lungs every 6 (six) hours as needed for wheezing or shortness of breath. 1 Inhaler 2  . amantadine (SYMMETREL) 100 MG capsule Take 1 capsule (100 mg total) by mouth daily. 30 capsule 0  . citalopram (CELEXA) 20 MG tablet Take 1 tablet (20 mg total) by mouth  daily. 30 tablet 1  . guaiFENesin-codeine (ROBITUSSIN AC) 100-10 MG/5ML syrup Take 5 mLs by mouth 3 (three) times daily as needed for cough. 120 mL 0  . lamoTRIgine (LAMICTAL) 25 MG tablet Take by mouth.    . levETIRAcetam (KEPPRA) 500 MG tablet Take 500 mg by mouth daily.    Marland Kitchen lisinopril (PRINIVIL,ZESTRIL) 10 MG tablet Take 10 mg by mouth daily.    . methocarbamol (ROBAXIN) 500 MG tablet Take 500 mg by mouth daily at 6 (six) AM.    . omeprazole (PRILOSEC) 20 MG capsule Take 40 mg by mouth every morning.    . predniSONE (DELTASONE) 10 MG tablet Take 5 tablets (50 mg total) by mouth  daily. 25 tablet 0  . QUEtiapine (SEROQUEL) 100 MG tablet Take 1 tablet (100 mg total) by mouth at bedtime. 30 tablet 1  . senna (SENOKOT) 8.6 MG TABS tablet Take 1 tablet (8.6 mg total) by mouth 2 (two) times daily. 120 each 0  . traZODone (DESYREL) 150 MG tablet Take 1 tablet (150 mg total) by mouth at bedtime. 30 tablet 1   No current facility-administered medications for this visit.    Previous Psychotropic Medications: He is currently taking BuSpar and trazodone Celexa. He is also on Tegretol for his seizure disorder  Substance Abuse History in the last 12 months:  Yes.    Beer - last month   Consequences of Substance Abuse: Legal Consequences:  spent most of my life in prison  Medical Decision Making:  Review of Psycho-Social Stressors (1) and Review and summation of old records (2)  Treatment Plan Summary: Medication management   Discontinue Seroquel He will continue on Celexa 20 mg daily I will start him on Geodon 40 mg daily after 3 days once he will detox from the Seroquel and does not have any dryness of his mouth and restless legs. Continue amantadine  100 mg daily at bedtime Continue trazodone 150 mg by mouth daily at bedtime  He will  follow-up in 4 weeks    More than 50% of the time spent in psychoeducation, counseling and coordination of care.      This note was generated in part  or whole with voice recognition software. Voice regonition is usually quite accurate but there are transcription errors that can and very often do occur. I apologize for any typographical errors that were not detected and corrected.    Rainey Pines, MD  6/13/20178:48 AM

## 2015-08-26 ENCOUNTER — Ambulatory Visit: Payer: Medicare PPO | Admitting: Psychiatry

## 2015-08-27 DIAGNOSIS — E782 Mixed hyperlipidemia: Secondary | ICD-10-CM | POA: Insufficient documentation

## 2015-08-27 DIAGNOSIS — R7303 Prediabetes: Secondary | ICD-10-CM | POA: Insufficient documentation

## 2015-09-24 ENCOUNTER — Encounter: Payer: Self-pay | Admitting: Psychiatry

## 2015-09-24 ENCOUNTER — Ambulatory Visit (INDEPENDENT_AMBULATORY_CARE_PROVIDER_SITE_OTHER): Payer: Medicare PPO | Admitting: Psychiatry

## 2015-09-24 VITALS — BP 122/60 | HR 101 | Temp 97.7°F | Ht 73.0 in | Wt 200.8 lb

## 2015-09-24 DIAGNOSIS — F070 Personality change due to known physiological condition: Secondary | ICD-10-CM

## 2015-09-24 DIAGNOSIS — F39 Unspecified mood [affective] disorder: Secondary | ICD-10-CM

## 2015-09-24 DIAGNOSIS — S0990XS Unspecified injury of head, sequela: Secondary | ICD-10-CM | POA: Diagnosis not present

## 2015-09-24 DIAGNOSIS — IMO0001 Reserved for inherently not codable concepts without codable children: Secondary | ICD-10-CM

## 2015-09-24 MED ORDER — TRAZODONE HCL 150 MG PO TABS: 150.0000 mg | ORAL_TABLET | Freq: Every day | ORAL | Status: DC

## 2015-09-24 MED ORDER — CITALOPRAM HYDROBROMIDE 20 MG PO TABS: 20.0000 mg | ORAL_TABLET | Freq: Every day | ORAL | Status: DC

## 2015-09-24 MED ORDER — OLANZAPINE 5 MG PO TABS: 5.0000 mg | ORAL_TABLET | Freq: Every day | ORAL | Status: DC

## 2015-09-24 NOTE — Progress Notes (Signed)
Psychiatric MD Progress Note   Patient Identification: [name] MRN:  [id number] Date of Evaluation:  09/24/2015 Referral Source: [facility] Chief Complaint:   Chief Complaint    Follow-up; Medication Refill     Visit Diagnosis:    ICD-9-CM ICD-10-CM   1. Episodic mood disorder ([place]) 296.90 F39   2. 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 Borderline diabetes mellitus [R73.03] 08/27/2015  . Combined fat and carbohydrate induced hyperlipemia [E78.2] 08/27/2015  . Anxiety [F41.9] 05/21/2015  . Moderate episode of recurrent major depressive disorder ([place]) [F33.1] 05/21/2015  . Seizure ([place]) [R56.9] 05/21/2015  . Cognitive decline [R41.89] 04/30/2015  . Abdominal distension [R14.0] 02/24/2015  . Hyponatremia [E87.1] 02/24/2015  . HTN (hypertension) [I10] 02/24/2015  . Hx of traumatic brain injury [Z87.820] 02/24/2015  . HTN (hypertension), benign [I10] 02/24/2015  . Abdominal distension (gaseous) [R14.0] 02/24/2015  . Essential (primary) hypertension [I10] 02/24/2015  . Hypo-osmolality and hyponatremia [E87.1] 02/24/2015  . Personal history of traumatic brain injury [Z87.820] 02/24/2015  . Insomnia, persistent [G47.00] 01/09/2015   History of Present Illness:    Patient is a 45 year old white male who presented  for the follow-up. He was accompanied by his mother.She reported that he continues to have More swings anger and agitation since he was started on Geodon. She reported that he is not improving with the medication. She wants to have him started on a different medication. We discussed about several medications and she reported that he was doing well on Zyprexa in the past. She reported that he was diagnosed with bipolar disorder in the past and he has taken the Zyprexa and did well. Patient appeared anxious and apprehensive during the interview. He reported that he  is unable to sleep and continues to feel restless. He also is becoming more paranoid and thinks that people are placing cameras to watch him as he stated that he has some issues with his various coworkers. His mother is very supportive and she has been keeping an eye on him. He currently denied having any suicidal ideations or plans. He has been taking trazodone at bedtime.   He has not started the amantadine to help with the restless leg.    We discussed about the medication options in detail and he demonstrated understanding.     Past Medical History:  Past Medical History  Diagnosis Date  . Chronic pain   . Poor historian   . Reflux   . Head injury   . Hypertension   . Anxiety   . Seizures ([place])   . Insomnia     Past Surgical History  Procedure Laterality Date  . Abdominal surgery    . Brain surgery     Family History:  Family History  Problem Relation Age of Onset  . Cancer Mother   . Cancer Father   . Alcohol abuse Father   . Schizophrenia Brother    Social History:   Social History   Social History  . Marital Status: Single    Spouse Name: N/A  . Number of Children: N/A  . Years of Education: N/A   Social History Main Topics  . Smoking status: Current Every Day Smoker -- 1.00 packs/day    Types: Cigarettes    Start date: 05/21/1990  . Smokeless tobacco: Former Systems developer    Quit date: 05/20/1985  . Alcohol Use:  1.2 oz/week    0 Glasses of wine, 2 Shots of liquor, 0 Standard drinks or equivalent, 0 Cans of beer per week     Comment: a couple 6 packs beer daily  . Drug Use: No     Comment: occ  . Sexual Activity: Not Currently   Other Topics Concern  . None   Social History Narrative   ** Merged History Encounter **       Additional Social History:  Currently lives with his mother who is very supportive. 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 He currently denied any pending legal  charges. He denied using any drugs or alcohol at this time  Musculoskeletal: Strength & Muscle Tone: within normal limits Gait & Station: normal Patient leans: N/A  Psychiatric Specialty Exam: Anxiety    Depression        Past medical history includes anxiety.     Review of Systems  Psychiatric/Behavioral: Positive for depression.    Blood pressure 122/60, pulse 101, temperature 97.7 F (36.5 C), temperature source Tympanic, height 6\' 1"  (1.854 m), weight 200 lb 12.8 oz (91.082 kg), SpO2 97 %.Body mass index is 26.5 kg/(m^2).  General Appearance: Casual  Eye Contact:  Fair  Speech:  Clear and Coherent  Volume:  Normal  Mood:  Anxious  Affect:  Depressed  Thought Process:  Goal Directed  Orientation:  Full (Time, Place, and Person)  Thought Content:  WDL  Suicidal Thoughts:  No  Homicidal Thoughts:  No  Memory:  imparied  Judgement:  Impaired  Insight:  Lacking  Psychomotor Activity:  Normal  Concentration:  Fair  Recall:  [employer] of Knowledge:Fair  Language: Fair  Akathisia:  No  Handed:  Right  AIMS (if indicated):    Assets:  Desire for Improvement Financial Resources/Insurance Social Support  ADL's:  Intact  Cognition: WNL  Sleep:  fair   Is the patient at risk to self?  No. Has the patient been a risk to self in the past 6 months?  No. Has the patient been a risk to self within the distant past?  No. Is the patient a risk to others?  No. Has the patient been a risk to others in the past 6 months?  No. Has the patient been a risk to others within the distant past?  No.  Allergies:   Allergies  Allergen Reactions  . Amitriptyline Other (See Comments)    Paralysis of pt    Current Medications: Current Outpatient Prescriptions  Medication Sig Dispense Refill  . albuterol (PROVENTIL HFA;VENTOLIN HFA) 108 (90 Base) MCG/ACT inhaler Inhale 2 puffs into the lungs every 6 (six) hours as needed for wheezing or shortness of breath. 1 Inhaler 2  .  amantadine (SYMMETREL) 100 MG capsule Take 1 capsule (100 mg total) by mouth daily. 30 capsule 1  . citalopram (CELEXA) 20 MG tablet Take 1 tablet (20 mg total) by mouth daily. 30 tablet 1  . lamoTRIgine (LAMICTAL) 25 MG tablet     . levETIRAcetam (KEPPRA) 500 MG tablet Take 500 mg by mouth daily.    Marland Kitchen lisinopril (PRINIVIL,ZESTRIL) 10 MG tablet Take 10 mg by mouth daily.    . methocarbamol (ROBAXIN) 500 MG tablet Take 500 mg by mouth daily at 6 (six) AM.    . omeprazole (PRILOSEC) 20 MG capsule Take 40 mg by mouth every morning.    Marland Kitchen  senna (SENOKOT) 8.6 MG TABS tablet Take 1 tablet (8.6 mg total) by mouth 2 (two) times daily. 120 each 0  . traZODone (DESYREL) 150 MG tablet Take 1 tablet (150 mg total) by mouth at bedtime. 30 tablet 1  . lamoTRIgine (LAMICTAL) 25 MG tablet Take by mouth.    . OLANZapine (ZYPREXA) 5 MG tablet Take 1 tablet (5 mg total) by mouth at bedtime. 30 tablet 0   No current facility-administered medications for this visit.    Previous Psychotropic Medications: He is currently taking BuSpar and trazodone Celexa. He is also on Tegretol for his seizure disorder  Substance Abuse History in the last 12 months:  Yes.    Beer - last month   Consequences of Substance Abuse: Legal Consequences:  spent most of my life in prison  Medical Decision Making:  Review of Psycho-Social Stressors (1) and Review and summation of old records (2)  Treatment Plan Summary: Medication management   Discontinue Geodon He will continue on Celexa 20 mg daily I will start him on Zyprexa 5 mg daily at bedtime and discussed with the patient that he can take up to 10 mg and he demonstrated understanding. Continue amantadine  100 mg daily at bedtime Continue trazodone 150 mg by mouth daily at bedtime  He will  follow-up in 2 weeks    More than 50% of the time spent in psychoeducation, counseling and coordination of care.      This note was generated in part or whole with voice  recognition software. Voice regonition is usually quite accurate but there are transcription errors that can and very often do occur. I apologize for any typographical errors that were not detected and corrected.    Rainey Pines, MD  7/13/20179:48 AM

## 2015-10-08 ENCOUNTER — Encounter: Payer: Self-pay | Admitting: Psychiatry

## 2015-10-08 ENCOUNTER — Ambulatory Visit (INDEPENDENT_AMBULATORY_CARE_PROVIDER_SITE_OTHER): Payer: Medicare PPO | Admitting: Psychiatry

## 2015-10-08 VITALS — BP 107/74 | HR 86 | Temp 97.5°F | Ht 73.0 in | Wt 194.8 lb

## 2015-10-08 DIAGNOSIS — F070 Personality change due to known physiological condition: Secondary | ICD-10-CM | POA: Diagnosis not present

## 2015-10-08 DIAGNOSIS — F39 Unspecified mood [affective] disorder: Secondary | ICD-10-CM

## 2015-10-08 DIAGNOSIS — S0990XS Unspecified injury of head, sequela: Secondary | ICD-10-CM | POA: Diagnosis not present

## 2015-10-08 DIAGNOSIS — IMO0001 Reserved for inherently not codable concepts without codable children: Secondary | ICD-10-CM

## 2015-10-08 MED ORDER — AMANTADINE HCL 100 MG PO CAPS: 100.0000 mg | ORAL_CAPSULE | Freq: Two times a day (BID) | ORAL | 1 refills | Status: DC

## 2015-10-08 MED ORDER — OLANZAPINE 5 MG PO TABS: 5.0000 mg | ORAL_TABLET | Freq: Every day | ORAL | 1 refills | Status: DC

## 2015-10-08 MED ORDER — CITALOPRAM HYDROBROMIDE 40 MG PO TABS
40.0000 mg | ORAL_TABLET | Freq: Every day | ORAL | 1 refills | Status: DC
Start: 2015-10-08 — End: 2015-12-09

## 2015-10-08 MED ORDER — TRAZODONE HCL 150 MG PO TABS: 150.0000 mg | ORAL_TABLET | Freq: Every day | ORAL | 1 refills | Status: DC

## 2015-10-08 NOTE — Progress Notes (Signed)
Psychiatric MD Progress Note   Patient Identification: [name] MRN:  [id number] Date of Evaluation:  10/08/2015 Referral Source: [facility] Chief Complaint:   Chief Complaint    Follow-up; Medication Refill; Panic Attack     Visit Diagnosis:    ICD-9-CM ICD-10-CM   1. Episodic mood disorder ([place]) 296.90 F39   2. 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 Borderline diabetes mellitus [R73.03] 08/27/2015  . Combined fat and carbohydrate induced hyperlipemia [E78.2] 08/27/2015  . Anxiety [F41.9] 05/21/2015  . Moderate episode of recurrent major depressive disorder ([place]) [F33.1] 05/21/2015  . Seizure ([place]) [R56.9] 05/21/2015  . Cognitive decline [R41.89] 04/30/2015  . Abdominal distension [R14.0] 02/24/2015  . Hyponatremia [E87.1] 02/24/2015  . HTN (hypertension) [I10] 02/24/2015  . Hx of traumatic brain injury [Z87.820] 02/24/2015  . HTN (hypertension), benign [I10] 02/24/2015  . Abdominal distension (gaseous) [R14.0] 02/24/2015  . Essential (primary) hypertension [I10] 02/24/2015  . Hypo-osmolality and hyponatremia [E87.1] 02/24/2015  . Personal history of traumatic brain injury [Z87.820] 02/24/2015  . Insomnia, persistent [G47.00] 01/09/2015   History of Present Illness:    Patient is a 45 year old white male who presented  for the follow-up. He was accompanied by his mother.Patient reported that he feels that he has panic attacks and he has been taking his amantadine at least twice daily. He is stable on his medication and has responded well to the olanzapine. Patient has been staying at home and has not been working at this time. 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 Patient feels frustrated most of the time as he has the whole day and has  nothing to do. We discussed about going to vocational rehabilitation to find some work. He demonstrated understanding. He reported that he spends time with his friend and they try to fix things around. Patient currently denied having any adverse effects of the medications. He denied having any suicidal homicidal ideations or plans.  His mother remains supportive. She has been helping him with his medications as well.  We discussed about the medication options in detail and he demonstrated understanding.     Past Medical History:  Past Medical History:  Diagnosis Date  . Anxiety   . Chronic pain   . Head injury   . Hypertension   . Insomnia   . Poor historian   . Reflux   . Seizures ([place])     Past Surgical History:  Procedure Laterality Date  . ABDOMINAL SURGERY    . BRAIN SURGERY     Family History:  Family History  Problem Relation Age of Onset  . Cancer Mother   . Cancer Father   . Alcohol abuse Father   . Schizophrenia Brother    Social History:   Social History   Social History  . Marital status: Single    Spouse name: N/A  . Number of children: N/A  . Years of education: N/A   Social History Main Topics  . Smoking status: Current Every Day Smoker    Packs/day: 1.00    Types: Cigarettes    Start date: 05/21/1990  . Smokeless tobacco: Former Systems developer    Quit date: 05/20/1985  . Alcohol use 1.2 oz/week  2 Shots of liquor per week     Comment: a couple 6 packs beer daily  . Drug use: No     Comment: occ  . Sexual activity: Not Currently   Other Topics Concern  . None   Social History Narrative   ** Merged History Encounter **       Additional Social History:  Currently lives with his mother who is very supportive. 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 He currently denied any pending legal charges. He denied using any drugs or alcohol at this time  Musculoskeletal: Strength & Muscle  Tone: within normal limits Gait & Station: normal Patient leans: N/A  Psychiatric Specialty Exam: Anxiety     Depression         Past medical history includes anxiety.   Medication Refill     Review of Systems  Psychiatric/Behavioral: Positive for depression.    Blood pressure 107/74, pulse 86, temperature 97.5 F (36.4 C), temperature source Oral, height 6\' 1"  (1.854 m), weight 194 lb 12.8 oz (88.4 kg).Body mass index is 25.7 kg/m.  General Appearance: Casual  Eye Contact:  Fair  Speech:  Clear and Coherent  Volume:  Normal  Mood:  Anxious  Affect:  Depressed  Thought Process:  Goal Directed  Orientation:  Full (Time, Place, and Person)  Thought Content:  WDL  Suicidal Thoughts:  No  Homicidal Thoughts:  No  Memory:  imparied  Judgement:  Impaired  Insight:  Lacking  Psychomotor Activity:  Normal  Concentration:  Fair  Recall:  [employer] of Knowledge:Fair  Language: Fair  Akathisia:  No  Handed:  Right  AIMS (if indicated):    Assets:  Desire for Improvement Financial Resources/Insurance Social Support  ADL's:  Intact  Cognition: WNL  Sleep:  fair   Is the patient at risk to self?  No. Has the patient been a risk to self in the past 6 months?  No. Has the patient been a risk to self within the distant past?  No. Is the patient a risk to others?  No. Has the patient been a risk to others in the past 6 months?  No. Has the patient been a risk to others within the distant past?  No.  Allergies:   Allergies  Allergen Reactions  . Amitriptyline Other (See Comments)    Paralysis of pt    Current Medications: Current Outpatient Prescriptions  Medication Sig Dispense Refill  . albuterol (PROVENTIL HFA;VENTOLIN HFA) 108 (90 Base) MCG/ACT inhaler Inhale 2 puffs into the lungs every 6 (six) hours as needed for wheezing or shortness of breath. 1 Inhaler 2  . amantadine (SYMMETREL) 100 MG capsule Take 1 capsule (100 mg total) by mouth 2 (two) times daily. 60  capsule 1  . citalopram (CELEXA) 40 MG tablet Take 1 tablet (40 mg total) by mouth daily. 30 tablet 1  . lamoTRIgine (LAMICTAL) 25 MG tablet     . levETIRAcetam (KEPPRA) 500 MG tablet Take 500 mg by mouth daily.    Marland Kitchen lisinopril (PRINIVIL,ZESTRIL) 10 MG tablet Take 10 mg by mouth daily.    . methocarbamol (ROBAXIN) 500 MG tablet Take 500 mg by mouth daily at 6 (six) AM.    . OLANZapine (ZYPREXA) 5 MG tablet Take 1 tablet (5 mg total) by mouth at bedtime. 30 tablet 1  . omeprazole (PRILOSEC) 20 MG capsule Take 40 mg  by mouth every morning.    . senna (SENOKOT) 8.6 MG TABS tablet Take 1 tablet (8.6 mg total) by mouth 2 (two) times daily. 120 each 0  . traZODone (DESYREL) 150 MG tablet Take 1 tablet (150 mg total) by mouth at bedtime. 30 tablet 1  . lamoTRIgine (LAMICTAL) 25 MG tablet Take by mouth.     No current facility-administered medications for this visit.     Previous Psychotropic Medications: He is currently taking BuSpar and trazodone Celexa. He is also on Tegretol for his seizure disorder  Substance Abuse History in the last 12 months:  Yes.    Beer - last month   Consequences of Substance Abuse: Legal Consequences:  spent most of my life in prison  Medical Decision Making:  Review of Psycho-Social Stressors (1) and Review and summation of old records (2)  Treatment Plan Summary: Medication management   Discontinue Geodon He will continue on Celexa 40 mg daily help with his anxiety and panic attacks. Continue Zyprexa 5 mg daily at bedtime and Continue amantadine  100 mg BID Continue trazodone 150 mg by mouth daily at bedtime  He will  follow-up in 2 months   More than 50% of the time spent in psychoeducation, counseling and coordination of care.      This note was generated in part or whole with voice recognition software. Voice regonition is usually quite accurate but there are transcription errors that can and very often do occur. I apologize for any typographical  errors that were not detected and corrected.    Rainey Pines, MD  7/27/20179:08 AM

## 2015-12-02 ENCOUNTER — Other Ambulatory Visit: Payer: Self-pay | Admitting: Psychiatry

## 2015-12-02 NOTE — Telephone Encounter (Signed)
called in 8 tablets for the celexa and trazodone and 16 tablets for the symmetral   gave patient enough for his next appt on  the  12-09-15

## 2015-12-02 NOTE — Telephone Encounter (Signed)
mother called left message that patient will not have enough medications until he is seem on  12-09-15.

## 2015-12-09 ENCOUNTER — Encounter: Payer: Self-pay | Admitting: Psychiatry

## 2015-12-09 ENCOUNTER — Ambulatory Visit (INDEPENDENT_AMBULATORY_CARE_PROVIDER_SITE_OTHER): Payer: Medicare PPO | Admitting: Psychiatry

## 2015-12-09 VITALS — BP 128/81 | HR 96 | Temp 97.6°F | Wt 190.2 lb

## 2015-12-09 DIAGNOSIS — F070 Personality change due to known physiological condition: Secondary | ICD-10-CM | POA: Diagnosis not present

## 2015-12-09 DIAGNOSIS — S0990XS Unspecified injury of head, sequela: Secondary | ICD-10-CM | POA: Diagnosis not present

## 2015-12-09 DIAGNOSIS — F39 Unspecified mood [affective] disorder: Secondary | ICD-10-CM

## 2015-12-09 DIAGNOSIS — IMO0001 Reserved for inherently not codable concepts without codable children: Secondary | ICD-10-CM

## 2015-12-09 MED ORDER — TRAZODONE HCL 150 MG PO TABS: 150.0000 mg | ORAL_TABLET | Freq: Every day | ORAL | 1 refills | Status: DC

## 2015-12-09 MED ORDER — AMANTADINE HCL 100 MG PO CAPS: 100.0000 mg | ORAL_CAPSULE | Freq: Two times a day (BID) | ORAL | 1 refills | Status: DC

## 2015-12-09 MED ORDER — QUETIAPINE FUMARATE 50 MG PO TABS: 50.0000 mg | ORAL_TABLET | Freq: Two times a day (BID) | ORAL | 1 refills | Status: DC

## 2015-12-09 MED ORDER — CITALOPRAM HYDROBROMIDE 40 MG PO TABS: 40.0000 mg | ORAL_TABLET | Freq: Every day | ORAL | 1 refills | Status: DC

## 2015-12-09 NOTE — Progress Notes (Signed)
Psychiatric MD Progress Note   Patient Identification: [name] MRN:  [id number] Date of Evaluation:  12/09/2015 Referral Source: [facility] Chief Complaint:   Chief Complaint    Follow-up; Medication Refill     Visit Diagnosis:    ICD-9-CM ICD-10-CM   1. Episodic mood disorder ([place]) 296.90 F39   2. 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 Borderline diabetes mellitus [R73.03] 08/27/2015  . Combined fat and carbohydrate induced hyperlipemia [E78.2] 08/27/2015  . Anxiety [F41.9] 05/21/2015  . Moderate episode of recurrent major depressive disorder ([place]) [F33.1] 05/21/2015  . Seizure ([place]) [R56.9] 05/21/2015  . Cognitive decline [R41.89] 04/30/2015  . Abdominal distension [R14.0] 02/24/2015  . Hyponatremia [E87.1] 02/24/2015  . HTN (hypertension) [I10] 02/24/2015  . Hx of traumatic brain injury [Z87.820] 02/24/2015  . HTN (hypertension), benign [I10] 02/24/2015  . Abdominal distension (gaseous) [R14.0] 02/24/2015  . Essential (primary) hypertension [I10] 02/24/2015  . Hypo-osmolality and hyponatremia [E87.1] 02/24/2015  . Personal history of traumatic brain injury [Z87.820] 02/24/2015  . Insomnia, persistent [G47.00] 01/09/2015   History of Present Illness:    Patient is a 45 year old white male who presented  for the follow-up. He was accompanied by his mother.His mother reported that he ran out of the amantadine as the pharmacy did not fill them and reported that he has missed his appointment although he has never missed any of his appointments. Patient reported that he has been having panic attacks and want to switch his medications. He wants to start taking the Seroquel again. Patient reported that he is not sleeping well at night. His  mother was also concerned about his sleep and wants to start the Depakote at the low dose. Patient stated that he wants to get up  early in the morning as [place] to see the sun rise. He appeared calm during the interview. His mother reported that they have 109 different people on their house who have been taking psychotropic medication and she has to keep up with all of them. She reported that she is trying to make him more independent so he can follow his medication regimen. Patient gets his medications in the pillbox and that he is taking them as prescribed.   Patient currently denied having any suicidal ideations or plans. He stated that amantadine has been very helpful and he has been taking it twice daily.   Mother is very supportive and takes care of him. She has been helping him with his medications as well.  We discussed about the medication options in detail and he demonstrated understanding.     Past Medical History:  Past Medical History:  Diagnosis Date  . Anxiety   . Chronic pain   . Head injury   . Hypertension   . Insomnia   . Poor historian   . Reflux   . Seizures ([place])     Past Surgical History:  Procedure Laterality Date  . ABDOMINAL SURGERY    . BRAIN SURGERY     Family History:  Family History  Problem Relation Age of Onset  . Cancer Mother   . Cancer Father   . Alcohol abuse Father   . Schizophrenia Brother    Social History:   Social History   Social History  . Marital status: Single    Spouse  name: N/A  . Number of children: N/A  . Years of education: N/A   Social History Main Topics  . Smoking status: Current Every Day Smoker    Packs/day: 1.00    Types: Cigarettes    Start date: 05/21/1990  . Smokeless tobacco: Former Systems developer    Quit date: 05/20/1985  . Alcohol use 1.2 oz/week    2 Shots of liquor per week     Comment: a couple 6 packs beer daily  . Drug use: No     Comment: occ  . Sexual activity: Not Currently   Other Topics Concern  . None   Social History Narrative   ** Merged History Encounter **       Additional  Social History:  Currently lives with his mother who is very supportive. 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 He currently denied any pending legal charges. He denied using any drugs or alcohol at this time  Musculoskeletal: Strength & Muscle Tone: within normal limits Gait & Station: normal Patient leans: N/A  Psychiatric Specialty Exam: Medication Refill   Anxiety     Depression         Past medical history includes anxiety.     Review of Systems  Psychiatric/Behavioral: Positive for depression.    There were no vitals taken for this visit.There is no height or weight on file to calculate BMI.  General Appearance: Casual  Eye Contact:  Fair  Speech:  Clear and Coherent  Volume:  Normal  Mood:  Anxious  Affect:  Depressed  Thought Process:  Goal Directed  Orientation:  Full (Time, Place, and Person)  Thought Content:  WDL  Suicidal Thoughts:  No  Homicidal Thoughts:  No  Memory:  imparied  Judgement:  Impaired  Insight:  Lacking  Psychomotor Activity:  Normal  Concentration:  Fair  Recall:  [employer] of Knowledge:Fair  Language: Fair  Akathisia:  No  Handed:  Right  AIMS (if indicated):    Assets:  Desire for Improvement Financial Resources/Insurance Social Support  ADL's:  Intact  Cognition: WNL  Sleep:  fair   Is the patient at risk to self?  No. Has the patient been a risk to self in the past 6 months?  No. Has the patient been a risk to self within the distant past?  No. Is the patient a risk to others?  No. Has the patient been a risk to others in the past 6 months?  No. Has the patient been a risk to others within the distant past?  No.  Allergies:   Allergies  Allergen Reactions  . Amitriptyline Other (See Comments)    Paralysis of pt    Current Medications: Current Outpatient Prescriptions  Medication Sig Dispense Refill  . albuterol (PROVENTIL HFA;VENTOLIN HFA) 108  (90 Base) MCG/ACT inhaler Inhale 2 puffs into the lungs every 6 (six) hours as needed for wheezing or shortness of breath. 1 Inhaler 2  . amantadine (SYMMETREL) 100 MG capsule Take 1 capsule (100 mg total) by mouth 2 (two) times daily. 60 capsule 1  . citalopram (CELEXA) 40 MG tablet Take 1 tablet (40 mg total) by mouth daily. 30 tablet 1  . lamoTRIgine (LAMICTAL) 25 MG tablet     . levETIRAcetam (KEPPRA) 500 MG tablet Take 500 mg by mouth daily.    Marland Kitchen lisinopril (PRINIVIL,ZESTRIL) 10 MG tablet Take 10 mg by  mouth daily.    . methocarbamol (ROBAXIN) 500 MG tablet Take 500 mg by mouth daily at 6 (six) AM.    . OLANZapine (ZYPREXA) 5 MG tablet Take 1 tablet (5 mg total) by mouth at bedtime. 30 tablet 1  . omeprazole (PRILOSEC) 20 MG capsule Take 40 mg by mouth every morning.    . senna (SENOKOT) 8.6 MG TABS tablet Take 1 tablet (8.6 mg total) by mouth 2 (two) times daily. 120 each 0  . traZODone (DESYREL) 150 MG tablet Take 1 tablet (150 mg total) by mouth at bedtime. 30 tablet 1  . lamoTRIgine (LAMICTAL) 25 MG tablet Take by mouth.     No current facility-administered medications for this visit.     Previous Psychotropic Medications: He is currently taking BuSpar and trazodone Celexa. He is also on Tegretol for his seizure disorder  Substance Abuse History in the last 12 months:  Yes.    Beer - last month   Consequences of Substance Abuse: Legal Consequences:  spent most of my life in prison  Medical Decision Making:  Review of Psycho-Social Stressors (1) and Review and summation of old records (2)  Treatment Plan Summary: Medication management   Discontinue Geodon He will continue on Celexa 40 mg daily help with his anxiety and panic attacks. DC Zyprexa I will start him on Seroquel 50 mg at bedtime and will give additional 50 mg to take it on a when necessary basis to help with his anxiety. Continue amantadine  100 mg BID Continue trazodone 150 mg by mouth daily at  bedtime Patient was given 2 month supply of the medications  He will  follow-up in 2 months or earlier depending on his symptoms   More than 50% of the time spent in psychoeducation, counseling and coordination of care.      This note was generated in part or whole with voice recognition software. Voice regonition is usually quite accurate but there are transcription errors that can and very often do occur. I apologize for any typographical errors that were not detected and corrected.    Rainey Pines, MD  9/27/20179:43 AM

## 2016-02-03 ENCOUNTER — Ambulatory Visit (INDEPENDENT_AMBULATORY_CARE_PROVIDER_SITE_OTHER): Payer: Medicare PPO | Admitting: Psychiatry

## 2016-02-03 DIAGNOSIS — S0990XS Unspecified injury of head, sequela: Secondary | ICD-10-CM | POA: Diagnosis not present

## 2016-02-03 DIAGNOSIS — IMO0001 Reserved for inherently not codable concepts without codable children: Secondary | ICD-10-CM

## 2016-02-03 DIAGNOSIS — F39 Unspecified mood [affective] disorder: Secondary | ICD-10-CM | POA: Diagnosis not present

## 2016-02-03 DIAGNOSIS — F070 Personality change due to known physiological condition: Secondary | ICD-10-CM

## 2016-02-03 MED ORDER — QUETIAPINE FUMARATE 200 MG PO TABS: 200.0000 mg | ORAL_TABLET | Freq: Every day | ORAL | 1 refills | Status: DC

## 2016-02-03 MED ORDER — TRAZODONE HCL 150 MG PO TABS
150.0000 mg | ORAL_TABLET | Freq: Every day | ORAL | 1 refills | Status: DC
Start: 2016-02-03 — End: 2016-03-23

## 2016-02-03 MED ORDER — AMANTADINE HCL 100 MG PO CAPS: 100.0000 mg | ORAL_CAPSULE | Freq: Two times a day (BID) | ORAL | 1 refills | Status: DC

## 2016-02-03 MED ORDER — CITALOPRAM HYDROBROMIDE 40 MG PO TABS: 40.0000 mg | ORAL_TABLET | Freq: Every day | ORAL | 1 refills | Status: DC

## 2016-02-03 NOTE — Progress Notes (Signed)
Psychiatric MD Progress Note   Patient Identification: [name] MRN:  [id number] Date of Evaluation:  02/03/2016 Referral Source: [facility] Chief Complaint:    Visit Diagnosis:    ICD-9-CM ICD-10-CM   1. Episodic mood disorder ([place]) 296.90 F39   2. 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 Borderline diabetes mellitus [R73.03] 08/27/2015  . Combined fat and carbohydrate induced hyperlipemia [E78.2] 08/27/2015  . Anxiety [F41.9] 05/21/2015  . Moderate episode of recurrent major depressive disorder ([place]) [F33.1] 05/21/2015  . Seizure ([place]) [R56.9] 05/21/2015  . Cognitive decline [R41.89] 04/30/2015  . Abdominal distension [R14.0] 02/24/2015  . Hyponatremia [E87.1] 02/24/2015  . HTN (hypertension) [I10] 02/24/2015  . Hx of traumatic brain injury [Z87.820] 02/24/2015  . HTN (hypertension), benign [I10] 02/24/2015  . Abdominal distension (gaseous) [R14.0] 02/24/2015  . Essential (primary) hypertension [I10] 02/24/2015  . Hypo-osmolality and hyponatremia [E87.1] 02/24/2015  . Personal history of traumatic brain injury [Z87.820] 02/24/2015  . Insomnia, persistent [G47.00] 01/09/2015   History of Present Illness:    Patient is a 45 year old white male who presented  for the follow-up. He was accompanied by his mother.His mother reported that he Had a seizure a few weeks ago and patient was evaluated by Dr. Manuella Ghazi. She reported that he was started on lamotrigine dissolvable tablets and Dr. Manuella Ghazi is titrating his medications. She reported that he is going to do a blood test on his medication as he thinks that the levels of lamotrigine are not high enough. He is also on Keppra. Patient reported that he is not sleeping enough at night. He is taking Seroquel 100 mg along with the trazodone. He continues to be anxious and restless. We discussed about the reasons and patient reported  that he is bored as he used to be very active and energetic before his accident. His mother is very supportive and tries to keep him at home and is helping him with the medications. Patient reported that he feels that the medications are helping him. He is also taking amantadine to help with his restless legs and it is very helpful. Patient currently denied having any suicidal ideations or plans.  Discussed about the patient to keep himself busy so he does not feel depressed as and he agreed with the plan. He also reported that he is having some GI side effects and he is getting medications from his primary care physician.  Patient appeared calm and alert during the interview. He currently denied having any suicidal homicidal ideations or plans. His mother is very supportive and helps him with his medications at this time.    We discussed about the medication options in detail and he demonstrated understanding.     Past Medical History:  Past Medical History:  Diagnosis Date  . Anxiety   . Chronic pain   . Head injury   . Hypertension   . Insomnia   . Poor historian   . Reflux   . Seizures ([place])     Past Surgical History:  Procedure Laterality Date  . ABDOMINAL SURGERY    . BRAIN SURGERY     Family History:  Family History  Problem Relation Age of Onset  . Cancer Mother   . Cancer Father   . Alcohol abuse Father   . Schizophrenia Brother    Social History:   Social History   Social  History  . Marital status: Single    Spouse name: N/A  . Number of children: N/A  . Years of education: N/A   Social History Main Topics  . Smoking status: Current Every Day Smoker    Packs/day: 1.00    Types: Cigarettes    Start date: 05/21/1990  . Smokeless tobacco: Former Systems developer    Quit date: 05/20/1985  . Alcohol use 1.2 oz/week    2 Shots of liquor per week     Comment: a couple 6 packs beer daily  . Drug use: No     Comment: occ  . Sexual activity: Not Currently   Other Topics  Concern  . Not on file   Social History Narrative   ** Merged History Encounter **       Additional Social History:  Currently lives with his mother who is very supportive. 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 He currently denied any pending legal charges. He denied using any drugs or alcohol at this time  Musculoskeletal: Strength & Muscle Tone: within normal limits Gait & Station: normal Patient leans: N/A  Psychiatric Specialty Exam: Medication Refill   Anxiety     Depression         Past medical history includes anxiety.     Review of Systems  Psychiatric/Behavioral: Positive for depression.    There were no vitals taken for this visit.There is no height or weight on file to calculate BMI.  General Appearance: Casual  Eye Contact:  Fair  Speech:  Clear and Coherent  Volume:  Normal  Mood:  Anxious  Affect:  Depressed  Thought Process:  Goal Directed  Orientation:  Full (Time, Place, and Person)  Thought Content:  WDL  Suicidal Thoughts:  No  Homicidal Thoughts:  No  Memory:  imparied  Judgement:  Impaired  Insight:  Lacking  Psychomotor Activity:  Normal  Concentration:  Fair  Recall:  [employer] of Knowledge:Fair  Language: Fair  Akathisia:  No  Handed:  Right  AIMS (if indicated):    Assets:  Desire for Improvement Financial Resources/Insurance Social Support  ADL's:  Intact  Cognition: WNL  Sleep:  fair   Is the patient at risk to self?  No. Has the patient been a risk to self in the past 6 months?  No. Has the patient been a risk to self within the distant past?  No. Is the patient a risk to others?  No. Has the patient been a risk to others in the past 6 months?  No. Has the patient been a risk to others within the distant past?  No.  Allergies:   Allergies  Allergen Reactions  . Amitriptyline Other (See Comments)    Paralysis of pt    Current Medications: Current  Outpatient Prescriptions  Medication Sig Dispense Refill  . amantadine (SYMMETREL) 100 MG capsule Take by mouth.    . LamoTRIgine 50 MG TBDP Take 50mg  in the morning and 100mg  at night for two weeks then take 100mg  two times a day.    . levETIRAcetam (KEPPRA) 500 MG tablet Take by mouth.    Marland Kitchen lisinopril (PRINIVIL,ZESTRIL) 10 MG tablet Take by mouth.    Marland Kitchen albuterol (PROVENTIL HFA;VENTOLIN HFA) 108 (90 Base) MCG/ACT inhaler Inhale 2 puffs into the lungs every 6 (six) hours as needed for wheezing or shortness of breath. 1 Inhaler 2  .  amantadine (SYMMETREL) 100 MG capsule Take 1 capsule (100 mg total) by mouth 2 (two) times daily. 60 capsule 1  . citalopram (CELEXA) 40 MG tablet Take 1 tablet (40 mg total) by mouth daily. 30 tablet 1  . levETIRAcetam (KEPPRA) 500 MG tablet Take 500 mg by mouth daily.    Marland Kitchen lisinopril (PRINIVIL,ZESTRIL) 10 MG tablet Take 10 mg by mouth daily.    . methocarbamol (ROBAXIN) 500 MG tablet Take 500 mg by mouth daily at 6 (six) AM.    . omeprazole (PRILOSEC) 20 MG capsule Take 40 mg by mouth every morning.    Marland Kitchen QUEtiapine (SEROQUEL) 200 MG tablet Take 1 tablet (200 mg total) by mouth at bedtime. 30 tablet 1  . senna (SENOKOT) 8.6 MG TABS tablet Take 1 tablet (8.6 mg total) by mouth 2 (two) times daily. 120 each 0  . traZODone (DESYREL) 150 MG tablet Take 1 tablet (150 mg total) by mouth at bedtime. 30 tablet 1   No current facility-administered medications for this visit.     Previous Psychotropic Medications: He is currently taking BuSpar and trazodone Celexa. He is also on Tegretol for his seizure disorder  Substance Abuse History in the last 12 months:  Yes.    Beer - last month   Consequences of Substance Abuse: Legal Consequences:  spent most of my life in prison  Medical Decision Making:  Review of Psycho-Social Stressors (1) and Review and summation of old records (2)  Treatment Plan Summary: Medication management   He will continue on Celexa 40 mg  daily help with his anxiety and panic attacks.  I will start him on Seroquel 200 mg at bedtime and advised the patient to decrease the dose if he feels that it is too strong. Mother agreed with the plan.  Continue amantadine  100 mg BID Continue trazodone 150 mg by mouth daily at bedtime. Advised him to take it half to 1 pill when necessary for insomnia. Patient was given 2 month supply of the medications  He will  follow-up in 2 months or earlier depending on his symptoms   More than 50% of the time spent in psychoeducation, counseling and coordination of care.      This note was generated in part or whole with voice recognition software. Voice regonition is usually quite accurate but there are transcription errors that can and very often do occur. I apologize for any typographical errors that were not detected and corrected.    Rainey Pines, MD  11/22/201710:12 AM

## 2016-03-23 ENCOUNTER — Encounter: Payer: Self-pay | Admitting: Psychiatry

## 2016-03-23 ENCOUNTER — Ambulatory Visit (INDEPENDENT_AMBULATORY_CARE_PROVIDER_SITE_OTHER): Payer: Medicare HMO | Admitting: Psychiatry

## 2016-03-23 VITALS — BP 127/81 | HR 94 | Temp 97.8°F | Wt 196.8 lb

## 2016-03-23 DIAGNOSIS — IMO0001 Reserved for inherently not codable concepts without codable children: Secondary | ICD-10-CM

## 2016-03-23 DIAGNOSIS — F39 Unspecified mood [affective] disorder: Secondary | ICD-10-CM

## 2016-03-23 DIAGNOSIS — S0990XS Unspecified injury of head, sequela: Secondary | ICD-10-CM

## 2016-03-23 DIAGNOSIS — F070 Personality change due to known physiological condition: Secondary | ICD-10-CM | POA: Diagnosis not present

## 2016-03-23 MED ORDER — CITALOPRAM HYDROBROMIDE 40 MG PO TABS: 40.0000 mg | ORAL_TABLET | Freq: Every day | ORAL | 1 refills | Status: DC

## 2016-03-23 MED ORDER — TEMAZEPAM 15 MG PO CAPS: 15.0000 mg | ORAL_CAPSULE | Freq: Every evening | ORAL | 1 refills | Status: DC | PRN

## 2016-03-23 MED ORDER — AMANTADINE HCL 100 MG PO CAPS: 100.0000 mg | ORAL_CAPSULE | Freq: Two times a day (BID) | ORAL | 1 refills | Status: DC

## 2016-03-23 MED ORDER — QUETIAPINE FUMARATE 200 MG PO TABS: 200.0000 mg | ORAL_TABLET | Freq: Every day | ORAL | 1 refills | Status: DC

## 2016-03-23 NOTE — Progress Notes (Signed)
Psychiatric MD Progress Note   Patient Identification: [name] MRN:  [id number] Date of Evaluation:  03/23/2016 Referral Source: [facility] Chief Complaint:   Chief Complaint    Follow-up; Medication Refill     Visit Diagnosis:    ICD-9-CM ICD-10-CM   1. Episodic mood disorder ([place]) 296.90 F39   2. 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 Borderline diabetes mellitus [R73.03] 08/27/2015  . Combined fat and carbohydrate induced hyperlipemia [E78.2] 08/27/2015  . Anxiety [F41.9] 05/21/2015  . Moderate episode of recurrent major depressive disorder ([place]) [F33.1] 05/21/2015  . Seizure ([place]) [R56.9] 05/21/2015  . Cognitive decline [R41.89] 04/30/2015  . Abdominal distension [R14.0] 02/24/2015  . Hyponatremia [E87.1] 02/24/2015  . HTN (hypertension) [I10] 02/24/2015  . Hx of traumatic brain injury [Z87.820] 02/24/2015  . HTN (hypertension), benign [I10] 02/24/2015  . Abdominal distension (gaseous) [R14.0] 02/24/2015  . Essential (primary) hypertension [I10] 02/24/2015  . Hypo-osmolality and hyponatremia [E87.1] 02/24/2015  . Personal history of traumatic brain injury [Z87.820] 02/24/2015  . Insomnia, persistent [G47.00] 01/09/2015   History of Present Illness:    Patient is a 46 year old white male who presented  for the follow-up. He was accompanied by his mother.His mother reported that he Is not sleeping at night. Patient was upset about the same. He reported that trazodone is not helping him. His mother reported that he is only sleeping one hour at night. Patient reported that he is also upset since his skull is sinking insight and he is going to have appointment with her surgeon in [place] next month. He is going to discuss with him since he can feel this cruise again. Patient reported that he has been compliant with his medication. His mother reported that he was  doing well on the Trileptal in the past but he had the side effects of the medications. We discussed about his medications. He is willing to try the Restoril for his insomnia at this time. He currently denied having any suicidal homicidal ideations or plans. His mother remains supportive of him.   We discussed about the medication options in detail and he demonstrated understanding.     Past Medical History:  Past Medical History:  Diagnosis Date  . Anxiety   . Chronic pain   . Head injury   . Hypertension   . Insomnia   . Poor historian   . Reflux   . Seizures ([place])     Past Surgical History:  Procedure Laterality Date  . ABDOMINAL SURGERY    . BRAIN SURGERY     Family History:  Family History  Problem Relation Age of Onset  . Cancer Mother   . Cancer Father   . Alcohol abuse Father   . Schizophrenia Brother    Social History:   Social History   Social History  . Marital status: Single    Spouse name: N/A  . Number of children: N/A  . Years of education: N/A   Social History Main Topics  . Smoking status: Current Every Day Smoker    Packs/day: 1.00    Types: Cigarettes    Start date: 05/21/1990  . Smokeless tobacco: Former Systems developer    Quit date: 05/20/1985  . Alcohol use 1.2 oz/week    2 Shots of liquor per week     Comment: a couple 6 packs beer daily  . Drug  use: No     Comment: occ  . Sexual activity: Not Currently   Other Topics Concern  . None   Social History Narrative   ** Merged History Encounter **       Additional Social History:  Currently lives with his mother who is very supportive. 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 He currently denied any pending legal charges. He denied using any drugs or alcohol at this time  Musculoskeletal: Strength & Muscle Tone: within normal limits Gait & Station: normal Patient leans: N/A  Psychiatric Specialty Exam: Medication Refill    Anxiety     Depression         Past medical history includes anxiety.     Review of Systems  Psychiatric/Behavioral: Positive for depression.    Blood pressure 127/81, pulse 94, temperature 97.8 F (36.6 C), temperature source Oral, weight 196 lb 12.8 oz (89.3 kg).Body mass index is 25.96 kg/m.  General Appearance: Casual  Eye Contact:  Fair  Speech:  Clear and Coherent  Volume:  Normal  Mood:  Anxious  Affect:  Depressed  Thought Process:  Goal Directed  Orientation:  Full (Time, Place, and Person)  Thought Content:  WDL  Suicidal Thoughts:  No  Homicidal Thoughts:  No  Memory:  imparied  Judgement:  Impaired  Insight:  Lacking  Psychomotor Activity:  Normal  Concentration:  Fair  Recall:  [employer] of Knowledge:Fair  Language: Fair  Akathisia:  No  Handed:  Right  AIMS (if indicated):    Assets:  Desire for Improvement Financial Resources/Insurance Social Support  ADL's:  Intact  Cognition: WNL  Sleep:  fair   Is the patient at risk to self?  No. Has the patient been a risk to self in the past 6 months?  No. Has the patient been a risk to self within the distant past?  No. Is the patient a risk to others?  No. Has the patient been a risk to others in the past 6 months?  No. Has the patient been a risk to others within the distant past?  No.  Allergies:   Allergies  Allergen Reactions  . Amitriptyline Other (See Comments)    Paralysis of pt    Current Medications: Current Outpatient Prescriptions  Medication Sig Dispense Refill  . albuterol (PROVENTIL HFA;VENTOLIN HFA) 108 (90 Base) MCG/ACT inhaler Inhale 2 puffs into the lungs every 6 (six) hours as needed for wheezing or shortness of breath. 1 Inhaler 2  . amantadine (SYMMETREL) 100 MG capsule Take by mouth.    Marland Kitchen amantadine (SYMMETREL) 100 MG capsule Take 1 capsule (100 mg total) by mouth 2 (two) times daily. 60 capsule 1  . citalopram (CELEXA) 40 MG tablet Take 1 tablet (40 mg total) by mouth  daily. 30 tablet 1  . LamoTRIgine 50 MG TBDP Take 50mg  in the morning and 100mg  at night for two weeks then take 100mg  two times a day.    . levETIRAcetam (KEPPRA) 500 MG tablet Take 500 mg by mouth daily.    Marland Kitchen levETIRAcetam (KEPPRA) 500 MG tablet Take by mouth.    Marland Kitchen lisinopril (PRINIVIL,ZESTRIL) 10 MG tablet Take 10 mg by mouth daily.    Marland Kitchen lisinopril (PRINIVIL,ZESTRIL) 10 MG tablet Take by mouth.    . methocarbamol (ROBAXIN) 500 MG tablet Take 500 mg by mouth daily at 6 (six) AM.    .  omeprazole (PRILOSEC) 20 MG capsule Take 40 mg by mouth every morning.    Marland Kitchen QUEtiapine (SEROQUEL) 200 MG tablet Take 1 tablet (200 mg total) by mouth at bedtime. 30 tablet 1  . senna (SENOKOT) 8.6 MG TABS tablet Take 1 tablet (8.6 mg total) by mouth 2 (two) times daily. 120 each 0  . temazepam (RESTORIL) 15 MG capsule Take 1 capsule (15 mg total) by mouth at bedtime as needed for sleep. 30 capsule 1   No current facility-administered medications for this visit.     Previous Psychotropic Medications: He is currently taking BuSpar and trazodone Celexa. He is also on Tegretol for his seizure disorder  Substance Abuse History in the last 12 months:  Yes.    Beer - last month   Consequences of Substance Abuse: Legal Consequences:  spent most of my life in prison  Medical Decision Making:  Review of Psycho-Social Stressors (1) and Review and summation of old records (2)  Treatment Plan Summary: Medication management   He will continue on Celexa 40 mg daily help with his anxiety and panic attacks.  Continue  Seroquel 200 mg at bedtime and advised the patient to decrease the dose if he feels that it is too strong. Mother agreed with the plan.  Continue amantadine  100 mg BID Discontinue trazodone I will start him on Restoril 15 mg by mouth daily at bedtime for insomnia and he agreed with the plan. He was given 2 month supply of the medication. Patient was given 2 month supply of the medications  He will   follow-up in 2 months or earlier depending on his symptoms   More than 50% of the time spent in psychoeducation, counseling and coordination of care.      This note was generated in part or whole with voice recognition software. Voice regonition is usually quite accurate but there are transcription errors that can and very often do occur. I apologize for any typographical errors that were not detected and corrected.    Rainey Pines, MD  1/10/20181:05 PM

## 2016-03-28 ENCOUNTER — Other Ambulatory Visit: Payer: Self-pay | Admitting: Psychiatry

## 2016-03-28 ENCOUNTER — Telehealth: Payer: Self-pay

## 2016-03-28 MED ORDER — TRAZODONE HCL 150 MG PO TABS: 150.0000 mg | ORAL_TABLET | Freq: Every evening | ORAL | 1 refills | Status: DC | PRN

## 2016-03-28 NOTE — Telephone Encounter (Signed)
Will refill trazodone

## 2016-03-28 NOTE — Telephone Encounter (Signed)
pt mother called states that he is not able to take the temazepan.  pt is sick on the stomach and can not pee when he is taking this.  pt mother wants he to go back on trazodone.  pt was last seen on  03-23-16 next appt  05-13-16

## 2016-05-18 ENCOUNTER — Ambulatory Visit (INDEPENDENT_AMBULATORY_CARE_PROVIDER_SITE_OTHER): Payer: Medicare HMO | Admitting: Psychiatry

## 2016-05-18 ENCOUNTER — Encounter: Payer: Self-pay | Admitting: Psychiatry

## 2016-05-18 VITALS — BP 118/75 | HR 85 | Temp 97.7°F | Wt 205.6 lb

## 2016-05-18 DIAGNOSIS — S0990XS Unspecified injury of head, sequela: Secondary | ICD-10-CM | POA: Diagnosis not present

## 2016-05-18 DIAGNOSIS — F070 Personality change due to known physiological condition: Secondary | ICD-10-CM | POA: Diagnosis not present

## 2016-05-18 DIAGNOSIS — F39 Unspecified mood [affective] disorder: Secondary | ICD-10-CM | POA: Diagnosis not present

## 2016-05-18 DIAGNOSIS — IMO0001 Reserved for inherently not codable concepts without codable children: Secondary | ICD-10-CM

## 2016-05-18 MED ORDER — CITALOPRAM HYDROBROMIDE 40 MG PO TABS: 40.0000 mg | ORAL_TABLET | Freq: Every day | ORAL | 1 refills | Status: DC

## 2016-05-18 MED ORDER — QUETIAPINE FUMARATE 200 MG PO TABS: 200.0000 mg | ORAL_TABLET | Freq: Every day | ORAL | 1 refills | Status: DC

## 2016-05-18 MED ORDER — TRAZODONE HCL 150 MG PO TABS: 150.0000 mg | ORAL_TABLET | Freq: Every evening | ORAL | 1 refills | Status: DC | PRN

## 2016-05-18 MED ORDER — AMANTADINE HCL 100 MG PO CAPS: 100.0000 mg | ORAL_CAPSULE | Freq: Two times a day (BID) | ORAL | 1 refills | Status: DC

## 2016-05-18 NOTE — Progress Notes (Signed)
Psychiatric MD Progress Note   Patient Identification: [name] MRN:  [id number] Date of Evaluation:  05/18/2016 Referral Source: [facility] Chief Complaint:   Chief Complaint    Follow-up; Medication Refill     Visit Diagnosis:    ICD-9-CM ICD-10-CM   1. Episodic mood disorder ([place]) 296.90 F39   2. 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 Borderline diabetes mellitus [R73.03] 08/27/2015  . Combined fat and carbohydrate induced hyperlipemia [E78.2] 08/27/2015  . Anxiety [F41.9] 05/21/2015  . Moderate episode of recurrent major depressive disorder ([place]) [F33.1] 05/21/2015  . Seizure ([place]) [R56.9] 05/21/2015  . Cognitive decline [R41.89] 04/30/2015  . Abdominal distension [R14.0] 02/24/2015  . Hyponatremia [E87.1] 02/24/2015  . HTN (hypertension) [I10] 02/24/2015  . Hx of traumatic brain injury [Z87.820] 02/24/2015  . HTN (hypertension), benign [I10] 02/24/2015  . Abdominal distension (gaseous) [R14.0] 02/24/2015  . Essential (primary) hypertension [I10] 02/24/2015  . Hypo-osmolality and hyponatremia [E87.1] 02/24/2015  . Personal history of traumatic brain injury [Z87.820] 02/24/2015  . Insomnia, persistent [G47.00] 01/09/2015   History of Present Illness:    Patient is a 46 year old white male who presented  for the follow-up. He was accompanied by his mother  Patient reported that he does not trust anybody. He reported that he has been stable on his medications. He reported that he feels calm and alert on his medications. His mother also reported that he has been sleeping well at night. However there is a bar behind their house and sometimes they make a lot of noise. However the patient is trying to sleep with the help of his trazodone. He has been feeling anxious at time and was asking his neurologist about anxiety medication and they referred him over here. We  discussed about the benzodiazepines and he does not want to try benzodiazepines due to his memory issues. He is compliant with his medications. His mother reported that he has been taking his medications. We discussed about the use of white events including vitamin D and vitamin E to help with his memory problems. His mother reports his medications in the pillbox.  Patient currently denied having any suicidal homicidal ideations or plans. He denied having any perceptual disturbances.  His mother remains supportive of him.   Past Medical History:  Past Medical History:  Diagnosis Date  . Anxiety   . Chronic pain   . Head injury   . Hypertension   . Insomnia   . Poor historian   . Reflux   . Seizures ([place])     Past Surgical History:  Procedure Laterality Date  . ABDOMINAL SURGERY    . BRAIN SURGERY     Family History:  Family History  Problem Relation Age of Onset  . Cancer Mother   . Cancer Father   . Alcohol abuse Father   . Schizophrenia Brother    Social History:   Social History   Social History  . Marital status: Single    Spouse name: N/A  . Number of children: N/A  . Years of education: N/A   Social History Main Topics  . Smoking status: Current Every Day Smoker    Packs/day: 1.00    Types: Cigarettes    Start date: 05/21/1990  . Smokeless tobacco: Former Systems developer    Quit date: 05/20/1985  . Alcohol use 1.2 oz/week    2  Shots of liquor per week     Comment: a couple 6 packs beer daily  . Drug use: No     Comment: occ  . Sexual activity: Not Currently   Other Topics Concern  . None   Social History Narrative   ** Merged History Encounter **       Additional Social History:  Currently lives with his mother who is very supportive. 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 He currently denied any pending legal charges. He denied using any drugs or alcohol at this  time  Musculoskeletal: Strength & Muscle Tone: within normal limits Gait & Station: normal Patient leans: N/A  Psychiatric Specialty Exam: Medication Refill   Anxiety     Depression         Past medical history includes anxiety.     Review of Systems  Psychiatric/Behavioral: Positive for depression.    Blood pressure 118/75, pulse 85, temperature 97.7 F (36.5 C), temperature source Oral, weight 205 lb 9.6 oz (93.3 kg).Body mass index is 27.13 kg/m.  General Appearance: Casual  Eye Contact:  Fair  Speech:  Clear and Coherent  Volume:  Normal  Mood:  Anxious  Affect:  Depressed  Thought Process:  Goal Directed  Orientation:  Full (Time, Place, and Person)  Thought Content:  WDL  Suicidal Thoughts:  No  Homicidal Thoughts:  No  Memory:  imparied  Judgement:  Impaired  Insight:  Lacking  Psychomotor Activity:  Normal  Concentration:  Fair  Recall:  [employer] of Knowledge:Fair  Language: Fair  Akathisia:  No  Handed:  Right  AIMS (if indicated):    Assets:  Desire for Improvement Financial Resources/Insurance Social Support  ADL's:  Intact  Cognition: WNL  Sleep:  fair   Is the patient at risk to self?  No. Has the patient been a risk to self in the past 6 months?  No. Has the patient been a risk to self within the distant past?  No. Is the patient a risk to others?  No. Has the patient been a risk to others in the past 6 months?  No. Has the patient been a risk to others within the distant past?  No.  Allergies:   Allergies  Allergen Reactions  . Amitriptyline Other (See Comments)    Paralysis of pt    Current Medications: Current Outpatient Prescriptions  Medication Sig Dispense Refill  . albuterol (PROVENTIL HFA;VENTOLIN HFA) 108 (90 Base) MCG/ACT inhaler Inhale 2 puffs into the lungs every 6 (six) hours as needed for wheezing or shortness of breath. 1 Inhaler 2  . amantadine (SYMMETREL) 100 MG capsule Take by mouth.    Marland Kitchen amantadine (SYMMETREL)  100 MG capsule Take 1 capsule (100 mg total) by mouth 2 (two) times daily. 60 capsule 1  . citalopram (CELEXA) 40 MG tablet Take 1 tablet (40 mg total) by mouth daily. 30 tablet 1  . LamoTRIgine 50 MG TBDP Take 50mg  in the morning and 100mg  at night for two weeks then take 100mg  two times a day.    . levETIRAcetam (KEPPRA) 500 MG tablet Take 500 mg by mouth daily.    Marland Kitchen lisinopril (PRINIVIL,ZESTRIL) 10 MG tablet Take 10 mg by mouth daily.    Marland Kitchen lisinopril (PRINIVIL,ZESTRIL) 10 MG tablet Take by mouth.    . methocarbamol (ROBAXIN) 500 MG tablet Take 500 mg by mouth daily at  6 (six) AM.    . omeprazole (PRILOSEC) 20 MG capsule Take 40 mg by mouth every morning.    Marland Kitchen QUEtiapine (SEROQUEL) 200 MG tablet Take 1 tablet (200 mg total) by mouth at bedtime. 30 tablet 1  . senna (SENOKOT) 8.6 MG TABS tablet Take 1 tablet (8.6 mg total) by mouth 2 (two) times daily. 120 each 0  . traZODone (DESYREL) 150 MG tablet Take 1 tablet (150 mg total) by mouth at bedtime as needed for sleep. 30 tablet 1  . levETIRAcetam (KEPPRA) 500 MG tablet Take by mouth.     No current facility-administered medications for this visit.     Previous Psychotropic Medications: He is currently taking BuSpar and trazodone Celexa. He is also on Tegretol for his seizure disorder  Substance Abuse History in the last 12 months:  Yes.    Beer - last month   Consequences of Substance Abuse: Legal Consequences:  spent most of my life in prison  Medical Decision Making:  Review of Psycho-Social Stressors (1) and Review and summation of old records (2)  Treatment Plan Summary: Medication management   He will continue on Celexa 40 mg daily help with his anxiety and panic attacks.  Continue  Seroquel 200 mg at bedtime and advised the patient to decrease the dose if he feels that it is too strong. Mother agreed with the plan.  Continue amantadine  100 mg BID Trazodone 150 mg at bedtime Three-month supply of the medications was  given.   He will  follow-up in 3 months or earlier depending on his symptoms   More than 50% of the time spent in psychoeducation, counseling and coordination of care.      This note was generated in part or whole with voice recognition software. Voice regonition is usually quite accurate but there are transcription errors that can and very often do occur. I apologize for any typographical errors that were not detected and corrected.    Rainey Pines, MD  3/7/201811:00 AM

## 2016-07-18 ENCOUNTER — Other Ambulatory Visit: Payer: Self-pay | Admitting: Psychiatry

## 2016-07-18 ENCOUNTER — Telehealth: Payer: Self-pay

## 2016-07-18 MED ORDER — CITALOPRAM HYDROBROMIDE 40 MG PO TABS: 40.0000 mg | ORAL_TABLET | Freq: Every day | ORAL | 1 refills | Status: DC

## 2016-07-18 NOTE — Telephone Encounter (Signed)
refilled 

## 2016-07-18 NOTE — Telephone Encounter (Signed)
pt mother called states that her son needs a refill on celexa to get to his next appt in  june.

## 2016-07-22 DIAGNOSIS — E782 Mixed hyperlipidemia: Secondary | ICD-10-CM | POA: Diagnosis not present

## 2016-07-22 DIAGNOSIS — R7303 Prediabetes: Secondary | ICD-10-CM | POA: Diagnosis not present

## 2016-07-22 DIAGNOSIS — I10 Essential (primary) hypertension: Secondary | ICD-10-CM | POA: Diagnosis not present

## 2016-07-29 DIAGNOSIS — R7303 Prediabetes: Secondary | ICD-10-CM | POA: Diagnosis not present

## 2016-07-29 DIAGNOSIS — E782 Mixed hyperlipidemia: Secondary | ICD-10-CM | POA: Diagnosis not present

## 2016-07-29 DIAGNOSIS — I10 Essential (primary) hypertension: Secondary | ICD-10-CM | POA: Diagnosis not present

## 2016-08-17 ENCOUNTER — Ambulatory Visit: Payer: Medicare HMO | Admitting: Psychiatry

## 2016-08-30 ENCOUNTER — Telehealth (HOSPITAL_COMMUNITY): Payer: Self-pay | Admitting: *Deleted

## 2016-08-30 NOTE — Telephone Encounter (Signed)
spoke with patient regarding an appointment.   He would not speak with me.   He said his mother takes care of everything for him.   There is no release to speak with his mother.

## 2016-09-07 ENCOUNTER — Ambulatory Visit (INDEPENDENT_AMBULATORY_CARE_PROVIDER_SITE_OTHER): Payer: Medicare HMO | Admitting: Psychiatry

## 2016-09-07 ENCOUNTER — Encounter: Payer: Self-pay | Admitting: Psychiatry

## 2016-09-07 VITALS — BP 103/66 | HR 75 | Temp 97.9°F | Wt 206.6 lb

## 2016-09-07 DIAGNOSIS — IMO0001 Reserved for inherently not codable concepts without codable children: Secondary | ICD-10-CM

## 2016-09-07 DIAGNOSIS — F39 Unspecified mood [affective] disorder: Secondary | ICD-10-CM | POA: Diagnosis not present

## 2016-09-07 DIAGNOSIS — F070 Personality change due to known physiological condition: Secondary | ICD-10-CM

## 2016-09-07 DIAGNOSIS — S0990XS Unspecified injury of head, sequela: Secondary | ICD-10-CM | POA: Diagnosis not present

## 2016-09-07 MED ORDER — QUETIAPINE FUMARATE 200 MG PO TABS: 200.0000 mg | ORAL_TABLET | Freq: Every day | ORAL | 2 refills | Status: DC

## 2016-09-07 MED ORDER — AMANTADINE HCL 100 MG PO CAPS: 100.0000 mg | ORAL_CAPSULE | Freq: Two times a day (BID) | ORAL | 2 refills | Status: DC

## 2016-09-07 MED ORDER — CITALOPRAM HYDROBROMIDE 40 MG PO TABS: 40.0000 mg | ORAL_TABLET | Freq: Every day | ORAL | 2 refills | Status: DC

## 2016-09-07 MED ORDER — TRAZODONE HCL 150 MG PO TABS: 150.0000 mg | ORAL_TABLET | Freq: Every evening | ORAL | 2 refills | Status: DC | PRN
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
## 2016-10-10 ENCOUNTER — Ambulatory Visit (HOSPITAL_COMMUNITY): Payer: Self-pay | Admitting: Psychiatry

## 2016-10-18 ENCOUNTER — Ambulatory Visit (HOSPITAL_COMMUNITY): Payer: Self-pay | Admitting: Psychiatry

## 2016-11-03 DIAGNOSIS — R569 Unspecified convulsions: Secondary | ICD-10-CM | POA: Diagnosis not present

## 2016-11-03 DIAGNOSIS — Z79899 Other long term (current) drug therapy: Secondary | ICD-10-CM | POA: Diagnosis not present

## 2016-11-08 DIAGNOSIS — R030 Elevated blood-pressure reading, without diagnosis of hypertension: Secondary | ICD-10-CM | POA: Diagnosis not present

## 2016-11-08 DIAGNOSIS — Z6828 Body mass index (BMI) 28.0-28.9, adult: Secondary | ICD-10-CM | POA: Diagnosis not present

## 2016-11-08 DIAGNOSIS — G44329 Chronic post-traumatic headache, not intractable: Secondary | ICD-10-CM | POA: Diagnosis not present

## 2016-12-05 ENCOUNTER — Other Ambulatory Visit: Payer: Self-pay

## 2016-12-05 ENCOUNTER — Encounter: Payer: Self-pay | Admitting: Psychiatry

## 2016-12-05 ENCOUNTER — Telehealth: Payer: Self-pay

## 2016-12-05 ENCOUNTER — Ambulatory Visit (INDEPENDENT_AMBULATORY_CARE_PROVIDER_SITE_OTHER): Payer: Medicare HMO | Admitting: Psychiatry

## 2016-12-05 VITALS — BP 136/91 | HR 76 | Temp 97.7°F | Wt 222.8 lb

## 2016-12-05 DIAGNOSIS — F39 Unspecified mood [affective] disorder: Secondary | ICD-10-CM | POA: Diagnosis not present

## 2016-12-05 DIAGNOSIS — S0990XS Unspecified injury of head, sequela: Secondary | ICD-10-CM | POA: Diagnosis not present

## 2016-12-05 DIAGNOSIS — F070 Personality change due to known physiological condition: Secondary | ICD-10-CM

## 2016-12-05 DIAGNOSIS — IMO0001 Reserved for inherently not codable concepts without codable children: Secondary | ICD-10-CM

## 2016-12-05 MED ORDER — QUETIAPINE FUMARATE 200 MG PO TABS: 200.0000 mg | ORAL_TABLET | Freq: Every day | ORAL | 2 refills | Status: DC

## 2016-12-05 MED ORDER — TRAZODONE HCL 150 MG PO TABS: 150.0000 mg | ORAL_TABLET | Freq: Every evening | ORAL | 3 refills | Status: DC | PRN

## 2016-12-05 MED ORDER — CITALOPRAM HYDROBROMIDE 10 MG PO TABS: 10.0000 mg | ORAL_TABLET | Freq: Every day | ORAL | 1 refills | Status: DC

## 2016-12-05 MED ORDER — MIRTAZAPINE 15 MG PO TABS: 15.0000 mg | ORAL_TABLET | Freq: Every day | ORAL | 1 refills | Status: DC

## 2016-12-05 MED ORDER — AMANTADINE HCL 100 MG PO CAPS: 100.0000 mg | ORAL_CAPSULE | Freq: Two times a day (BID) | ORAL | 2 refills | Status: DC

## 2016-12-05 NOTE — Telephone Encounter (Signed)
[name] in regards to the celexa.  please call [phone] and speak with [name].

## 2016-12-05 NOTE — Telephone Encounter (Signed)
done

## 2016-12-05 NOTE — Progress Notes (Signed)
Psychiatric MD Progress Note   Patient Identification: [name] MRN:  [id number] Date of Evaluation:  12/05/2016 Referral Source: [facility] Chief Complaint:   Chief Complaint    Follow-up; Medication Refill; Insomnia     Visit Diagnosis:    ICD-10-CM   1. Episodic mood disorder ([place]) F39   2. 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 Borderline diabetes mellitus [R73.03] 08/27/2015  . Combined fat and carbohydrate induced hyperlipemia [E78.2] 08/27/2015  . Anxiety [F41.9] 05/21/2015  . Moderate episode of recurrent major depressive disorder ([place]) [F33.1] 05/21/2015  . Seizure ([place]) [R56.9] 05/21/2015  . Cognitive decline [R41.89] 04/30/2015  . Abdominal distension [R14.0] 02/24/2015  . Hyponatremia [E87.1] 02/24/2015  . HTN (hypertension) [I10] 02/24/2015  . Hx of traumatic brain injury [Z87.820] 02/24/2015  . HTN (hypertension), benign [I10] 02/24/2015  . Abdominal distension (gaseous) [R14.0] 02/24/2015  . Essential (primary) hypertension [I10] 02/24/2015  . Hypo-osmolality and hyponatremia [E87.1] 02/24/2015  . Personal history of traumatic brain injury [Z87.820] 02/24/2015  . Insomnia, persistent [G47.00] 01/09/2015   History of Present Illness:    Patient is a 46 year old white male who presented  for the follow-up. He was accompanied by his mother  Patient reported that he Has been having dryness of his mouth and was complaining about the same. He reported that he has been drinking enough water as well as different types of drinks. His mother reported that he is more active and has been spending time outside with his neighbor. She stated that he has his medications adjusted by his neurologist and they have decreased the dose of Keppra and  he is taking lamotrigine. She reported that he feels depressed and we discussed about adjusting his medications. He is receptive  to the same. He currently denied having any suicidal homicidal ideations or plans. He denied having any perceptual disturbances at this time.     Patient currently denied having any suicidal ideations or plans. He has been following with his neurologist on a regular basis.   Patient currently denied having any suicidal homicidal ideations or plans. He denied having any perceptual disturbances.  His mother remains supportive of him.   Past Medical History:  Past Medical History:  Diagnosis Date  . Anxiety   . Chronic pain   . Head injury   . Hypertension   . Insomnia   . Poor historian   . Reflux   . Seizures ([place])     Past Surgical History:  Procedure Laterality Date  . ABDOMINAL SURGERY    . BRAIN SURGERY     Family History:  Family History  Problem Relation Age of Onset  . Cancer Mother   . Cancer Father   . Alcohol abuse Father   . Schizophrenia Brother    Social History:   Social History   Social History  . Marital status: Single    Spouse name: N/A  . Number of children: N/A  . Years of education: N/A   Social History Main Topics  . Smoking status: Current Every Day Smoker    Packs/day: 1.00    Types: Cigarettes    Start date: 05/21/1990  . Smokeless tobacco: Former Systems developer    Quit date: 05/20/1985  . Alcohol use 1.2 oz/week    2 Shots of liquor per week     Comment: a couple 6 packs beer daily  .  Drug use: No     Comment: occ  . Sexual activity: Not Currently   Other Topics Concern  . None   Social History Narrative   ** Merged History Encounter **       Additional Social History:  Currently lives with his mother who is very supportive. 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 He currently denied any pending legal charges. He denied using any drugs or alcohol at this time  Musculoskeletal: Strength & Muscle Tone: within normal limits Gait & Station: normal Patient leans:  N/A  Psychiatric Specialty Exam: Medication Refill   Anxiety  Symptoms include insomnia.    Depression         Associated symptoms include insomnia.  Past medical history includes anxiety.   Insomnia  PMH includes: depression.    Review of Systems  Psychiatric/Behavioral: Positive for depression. The patient has insomnia.     Blood pressure (!) 136/91, pulse 76, temperature 97.7 F (36.5 C), temperature source Oral, weight 222 lb 12.8 oz (101.1 kg).Body mass index is 29.39 kg/m.  General Appearance: Casual  Eye Contact:  Fair  Speech:  Clear and Coherent  Volume:  Normal  Mood:  Anxious  Affect:  Depressed  Thought Process:  Goal Directed  Orientation:  Full (Time, Place, and Person)  Thought Content:  WDL  Suicidal Thoughts:  No  Homicidal Thoughts:  No  Memory:  imparied  Judgement:  Impaired  Insight:  Lacking  Psychomotor Activity:  Normal  Concentration:  Fair  Recall:  [employer] of Knowledge:Fair  Language: Fair  Akathisia:  No  Handed:  Right  AIMS (if indicated):    Assets:  Desire for Improvement Financial Resources/Insurance Social Support  ADL's:  Intact  Cognition: WNL  Sleep:  fair   Is the patient at risk to self?  No. Has the patient been a risk to self in the past 6 months?  No. Has the patient been a risk to self within the distant past?  No. Is the patient a risk to others?  No. Has the patient been a risk to others in the past 6 months?  No. Has the patient been a risk to others within the distant past?  No.  Allergies:   Allergies  Allergen Reactions  . Amitriptyline Other (See Comments)    Paralysis of pt    Current Medications: Current Outpatient Prescriptions  Medication Sig Dispense Refill  . albuterol (PROVENTIL HFA;VENTOLIN HFA) 108 (90 Base) MCG/ACT inhaler Inhale 2 puffs into the lungs every 6 (six) hours as needed for wheezing or shortness of breath. 1 Inhaler 2  . amantadine (SYMMETREL) 100 MG capsule Take 1 capsule (100  mg total) by mouth 2 (two) times daily. 180 capsule 2  . LamoTRIgine 50 MG TBDP Take 50mg  in the morning and 100mg  at night for two weeks then take 100mg  two times a day.    . levETIRAcetam (KEPPRA) 500 MG tablet Take 500 mg by mouth daily.    Marland Kitchen lisinopril (PRINIVIL,ZESTRIL) 10 MG tablet Take by mouth.    . methocarbamol (ROBAXIN) 500 MG tablet Take 500 mg by mouth daily at 6 (six) AM.    . omeprazole (PRILOSEC) 20 MG capsule Take 40 mg by mouth every morning.    Marland Kitchen QUEtiapine (SEROQUEL) 200 MG tablet Take 1 tablet (200 mg total) by mouth at bedtime. 90 tablet 2  . senna (  SENOKOT) 8.6 MG TABS tablet Take 1 tablet (8.6 mg total) by mouth 2 (two) times daily. 120 each 0  . traZODone (DESYREL) 150 MG tablet Take 1 tablet (150 mg total) by mouth at bedtime as needed for sleep. 90 tablet 3  . citalopram (CELEXA) 10 MG tablet Take 1 tablet (10 mg total) by mouth daily. 30 tablet 1  . levETIRAcetam (KEPPRA) 500 MG tablet Take by mouth.    . mirtazapine (REMERON) 15 MG tablet Take 1 tablet (15 mg total) by mouth at bedtime. 30 tablet 1   No current facility-administered medications for this visit.     Previous Psychotropic Medications: He is currently taking BuSpar and trazodone Celexa. He is also on Tegretol for his seizure disorder  Substance Abuse History in the last 12 months:  Yes.    Beer - last month   Consequences of Substance Abuse: Legal Consequences:  spent most of my life in prison  Medical Decision Making:  Review of Psycho-Social Stressors (1) and Review and summation of old records (2)  Treatment Plan Summary: Medication management  I will change the Celexa 10 mg by mouth daily I will start him on Remeron 15 mg by mouth daily at bedtime  Continue  Seroquel 200 mg at bedtime Continue amantadine  100 mg BID Trazodone 150 mg at bedtime    He will  follow-up in 2 months or earlier depending on his symptoms   More than 50% of the time spent in psychoeducation, counseling  and coordination of care.      This note was generated in part or whole with voice recognition software. Voice regonition is usually quite accurate but there are transcription errors that can and very often do occur. I apologize for any typographical errors that were not detected and corrected.    Rainey Pines, MD  9/24/20189:48 AM

## 2017-01-03 DIAGNOSIS — R569 Unspecified convulsions: Secondary | ICD-10-CM | POA: Diagnosis not present

## 2017-01-03 DIAGNOSIS — Z79899 Other long term (current) drug therapy: Secondary | ICD-10-CM | POA: Diagnosis not present

## 2017-01-11 ENCOUNTER — Telehealth: Payer: Self-pay

## 2017-01-11 NOTE — Telephone Encounter (Signed)
pt mother called states that the remeron is not working. pt is not sleeping and very moody.  pt would not see dr. Burney Gauze when offered to come in. pt wanted to wait until dr. Gretel Acre comes in the office. Pt mother was instructed that if symptoms get worse go to er.

## 2017-01-31 DIAGNOSIS — E782 Mixed hyperlipidemia: Secondary | ICD-10-CM | POA: Diagnosis not present

## 2017-01-31 DIAGNOSIS — R7303 Prediabetes: Secondary | ICD-10-CM | POA: Diagnosis not present

## 2017-01-31 DIAGNOSIS — I10 Essential (primary) hypertension: Secondary | ICD-10-CM | POA: Diagnosis not present

## 2017-02-01 DIAGNOSIS — E782 Mixed hyperlipidemia: Secondary | ICD-10-CM | POA: Diagnosis not present

## 2017-02-01 DIAGNOSIS — I10 Essential (primary) hypertension: Secondary | ICD-10-CM | POA: Diagnosis not present

## 2017-02-01 DIAGNOSIS — R7303 Prediabetes: Secondary | ICD-10-CM | POA: Diagnosis not present

## 2017-02-01 DIAGNOSIS — Z0000 Encounter for general adult medical examination without abnormal findings: Secondary | ICD-10-CM | POA: Diagnosis not present

## 2017-02-01 DIAGNOSIS — Z862 Personal history of diseases of the blood and blood-forming organs and certain disorders involving the immune mechanism: Secondary | ICD-10-CM | POA: Diagnosis not present

## 2017-02-01 DIAGNOSIS — Z23 Encounter for immunization: Secondary | ICD-10-CM | POA: Diagnosis not present

## 2017-02-01 DIAGNOSIS — L0201 Cutaneous abscess of face: Secondary | ICD-10-CM | POA: Diagnosis not present

## 2017-02-06 ENCOUNTER — Other Ambulatory Visit: Payer: Self-pay

## 2017-02-06 ENCOUNTER — Encounter: Payer: Self-pay | Admitting: Psychiatry

## 2017-02-06 ENCOUNTER — Ambulatory Visit (INDEPENDENT_AMBULATORY_CARE_PROVIDER_SITE_OTHER): Payer: Medicare HMO | Admitting: Psychiatry

## 2017-02-06 VITALS — BP 121/76 | HR 85 | Ht 73.0 in | Wt 218.0 lb

## 2017-02-06 DIAGNOSIS — IMO0001 Reserved for inherently not codable concepts without codable children: Secondary | ICD-10-CM

## 2017-02-06 DIAGNOSIS — F39 Unspecified mood [affective] disorder: Secondary | ICD-10-CM | POA: Diagnosis not present

## 2017-02-06 DIAGNOSIS — S0990XS Unspecified injury of head, sequela: Secondary | ICD-10-CM

## 2017-02-06 DIAGNOSIS — F070 Personality change due to known physiological condition: Secondary | ICD-10-CM

## 2017-02-06 MED ORDER — CITALOPRAM HYDROBROMIDE 10 MG PO TABS: 10.0000 mg | ORAL_TABLET | Freq: Every day | ORAL | 1 refills | Status: DC

## 2017-02-06 MED ORDER — QUETIAPINE FUMARATE 200 MG PO TABS: 200.0000 mg | ORAL_TABLET | Freq: Every day | ORAL | 2 refills | Status: DC

## 2017-02-06 MED ORDER — LAMOTRIGINE 150 MG PO TABS: 150.0000 mg | ORAL_TABLET | Freq: Every day | ORAL | 1 refills | Status: DC

## 2017-02-06 MED ORDER — TRAZODONE HCL 150 MG PO TABS
150.0000 mg | ORAL_TABLET | Freq: Every evening | ORAL | 3 refills | Status: DC | PRN
Start: 2017-02-06 — End: 2017-05-08

## 2017-02-06 MED ORDER — LAMOTRIGINE 150 MG PO TABS: 150.0000 mg | ORAL_TABLET | Freq: Two times a day (BID) | ORAL | 1 refills | Status: DC

## 2017-02-06 MED ORDER — TRAZODONE HCL 50 MG PO TABS: ORAL_TABLET | ORAL | 1 refills | Status: DC

## 2017-02-06 NOTE — Telephone Encounter (Signed)
PT WAS SEEN IN THE OFFICE TODAY . 02-06-17

## 2017-02-06 NOTE — Progress Notes (Signed)
Psychiatric MD Progress Note   Patient Identification: [name] MRN:  [id number] Date of Evaluation:  02/06/2017 Referral Source: [facility] Chief Complaint:    Visit Diagnosis:    ICD-10-CM   1. Episodic mood disorder ([place]) F39   2. 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 Borderline diabetes mellitus [R73.03] 08/27/2015  . Combined fat and carbohydrate induced hyperlipemia [E78.2] 08/27/2015  . Anxiety [F41.9] 05/21/2015  . Moderate episode of recurrent major depressive disorder ([place]) [F33.1] 05/21/2015  . Seizure ([place]) [R56.9] 05/21/2015  . Cognitive decline [R41.89] 04/30/2015  . Abdominal distension [R14.0] 02/24/2015  . Hyponatremia [E87.1] 02/24/2015  . HTN (hypertension) [I10] 02/24/2015  . Hx of traumatic brain injury [Z87.820] 02/24/2015  . HTN (hypertension), benign [I10] 02/24/2015  . Abdominal distension (gaseous) [R14.0] 02/24/2015  . Essential (primary) hypertension [I10] 02/24/2015  . Hypo-osmolality and hyponatremia [E87.1] 02/24/2015  . Personal history of traumatic brain injury [Z87.820] 02/24/2015  . Insomnia, persistent [G47.00] 01/09/2015   History of Present Illness:    Patient is a 46 year old white male who presented  for the follow-up. He was accompanied by his mother  Patient reported that he is doing well and he has stopped taking the Remeron as it was making him agitated and he was having jerking of his legs. He stated that the current combination of medication is helping him and he sleeping well. He only wants to take trazodone on a when necessary basis when he feels anxious. He reported that he sleeps well at night. He does not want to go higher on the dose of Celexa. His mother also reported that he has been doing well and has been taking amantadine on a when necessary basis as well. He has been compliant with his medication and spends most of  the time outside at his shop. His physician has advised him to start eating healthy as his cholesterol is high and the mother is helping him with the same. Patient appeared alert and calm during the interview. He denied having any suicidal homicidal ideations or plans. No behavior problems noted at this time.   He denied having any perceptual disturbances at this time.    His mother remains supportive of him.   Past Medical History:  Past Medical History:  Diagnosis Date  . Anxiety   . Chronic pain   . Head injury   . Hypertension   . Insomnia   . Poor historian   . Reflux   . Seizures ([place])     Past Surgical History:  Procedure Laterality Date  . ABDOMINAL SURGERY    . BRAIN SURGERY     Family History:  Family History  Problem Relation Age of Onset  . Cancer Mother   . Cancer Father   . Alcohol abuse Father   . Schizophrenia Brother    Social History:   Social History   Socioeconomic History  . Marital status: Single    Spouse name: None  . Number of children: None  . Years of education: None  . Highest education level: None  Social Needs  . Financial resource strain: None  . Food insecurity - worry: None  . Food insecurity - inability: None  . Transportation needs - medical: None  . Transportation needs - non-medical: None  Occupational History  . None  Tobacco Use  . Smoking status: Current Every Day Smoker  Packs/day: 1.00    Types: Cigarettes    Start date: 05/21/1990  . Smokeless tobacco: Former Systems developer    Quit date: 05/20/1985  Substance and Sexual Activity  . Alcohol use: Yes    Alcohol/week: 1.2 oz    Types: 2 Shots of liquor per week    Comment: a couple 6 packs beer daily  . Drug use: No    Comment: occ  . Sexual activity: Not Currently  Other Topics Concern  . None  Social History Narrative   ** Merged History Encounter **       Additional Social History:  Currently lives with his mother who is very supportive. 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 He does not want to do the same anymore. He currently denied any pending legal charges. He denied using any drugs or alcohol at this time  Musculoskeletal: Strength & Muscle Tone: within normal limits Gait & Station: normal Patient leans: N/A  Psychiatric Specialty Exam: Medication Refill   Insomnia  PMH includes: depression.  Anxiety  Symptoms include insomnia.    Depression         Associated symptoms include insomnia.  Past medical history includes anxiety.     Review of Systems  Psychiatric/Behavioral: Positive for depression. The patient has insomnia.     Blood pressure 121/76, pulse 85, height 6\' 1"  (1.854 m), weight 218 lb (98.9 kg).Body mass index is 28.76 kg/m.  General Appearance: Casual  Eye Contact:  Fair  Speech:  Clear and Coherent  Volume:  Normal  Mood:  Anxious  Affect:  Depressed  Thought Process:  Goal Directed  Orientation:  Full (Time, Place, and Person)  Thought Content:  WDL  Suicidal Thoughts:  No  Homicidal Thoughts:  No  Memory:  imparied  Judgement:  Impaired  Insight:  Lacking  Psychomotor Activity:  Normal  Concentration:  Fair  Recall:  [employer] of Knowledge:Fair  Language: Fair  Akathisia:  No  Handed:  Right  AIMS (if indicated):    Assets:  Desire for Improvement Financial Resources/Insurance Social Support  ADL's:  Intact  Cognition: WNL  Sleep:  fair   Is the patient at risk to self?  No. Has the patient been a risk to self in the past 6 months?  No. Has the patient been a risk to self within the distant past?  No. Is the patient a risk to others?  No. Has the patient been a risk to others in the past 6 months?  No. Has the patient been a risk to others within the distant past?  No.  Allergies:   Allergies  Allergen Reactions  . Amitriptyline Other (See Comments)    Paralysis of pt    Current Medications: Current Outpatient Medications  Medication Sig Dispense Refill   . albuterol (PROVENTIL HFA;VENTOLIN HFA) 108 (90 Base) MCG/ACT inhaler Inhale 2 puffs into the lungs every 6 (six) hours as needed for wheezing or shortness of breath. 1 Inhaler 2  . amantadine (SYMMETREL) 100 MG capsule Take 1 capsule (100 mg total) by mouth 2 (two) times daily. 180 capsule 2  . baclofen (LIORESAL) 10 MG tablet Take by mouth.    . citalopram (CELEXA) 10 MG tablet Take 1 tablet (10 mg total) by mouth daily. 90 tablet 1  . lamoTRIgine (LAMICTAL) 150 MG tablet Take 1 tablet (150 mg total) by mouth 2 (two) times daily. 180 tablet 1  . levETIRAcetam (KEPPRA) 500  MG tablet Take 500 mg by mouth daily.    Marland Kitchen omeprazole (PRILOSEC) 20 MG capsule Take 40 mg by mouth every morning.    Marland Kitchen QUEtiapine (SEROQUEL) 200 MG tablet Take 1 tablet (200 mg total) by mouth at bedtime. 90 tablet 2  . traZODone (DESYREL) 150 MG tablet Take 1 tablet (150 mg total) by mouth at bedtime as needed for sleep. 90 tablet 3  . levETIRAcetam (KEPPRA) 500 MG tablet Take by mouth.    Marland Kitchen lisinopril (PRINIVIL,ZESTRIL) 10 MG tablet Take by mouth.    . senna (SENOKOT) 8.6 MG TABS tablet Take 1 tablet (8.6 mg total) by mouth 2 (two) times daily. (Patient not taking: Reported on 02/06/2017) 120 each 0   No current facility-administered medications for this visit.     Previous Psychotropic Medications: He is currently taking BuSpar and trazodone Celexa. He is also on Tegretol for his seizure disorder  Substance Abuse History in the last 12 months:  Yes.    Beer - last month   Consequences of Substance Abuse: Legal Consequences:  spent most of my life in prison  Medical Decision Making:  Review of Psycho-Social Stressors (1) and Review and summation of old records (2)  Treatment Plan Summary: Medication management   Continue medications as follows  Celexa 10 mg by mouth daily Continue  Seroquel 200 mg at bedtime Continue amantadine  100 mg BID prn -  pt has supply  Trazodone 150 mg at bedtime I will also  prescribe him trazodone 50 mg when necessary   He will  follow-up in 3  months or earlier depending on his symptoms   More than 50% of the time spent in psychoeducation, counseling and coordination of care.      This note was generated in part or whole with voice recognition software. Voice regonition is usually quite accurate but there are transcription errors that can and very often do occur. I apologize for any typographical errors that were not detected and corrected.    Rainey Pines, MD  11/26/201810:13 AM

## 2017-03-09 ENCOUNTER — Emergency Department
Admission: EM | Admit: 2017-03-09 | Discharge: 2017-03-09 | Disposition: A | Discharge status: Home / Self Care | Payer: Medicare HMO | Attending: Student in an Organized Health Care Education/Training Program | Admitting: Student in an Organized Health Care Education/Training Program

## 2017-03-09 ENCOUNTER — Emergency Department: Payer: Medicare HMO

## 2017-03-09 ENCOUNTER — Other Ambulatory Visit: Payer: Self-pay

## 2017-03-09 ENCOUNTER — Encounter: Payer: Self-pay | Admitting: Emergency Medicine

## 2017-03-09 DIAGNOSIS — I10 Essential (primary) hypertension: Secondary | ICD-10-CM | POA: Insufficient documentation

## 2017-03-09 DIAGNOSIS — R0789 Other chest pain: Secondary | ICD-10-CM

## 2017-03-09 DIAGNOSIS — R222 Localized swelling, mass and lump, trunk: Secondary | ICD-10-CM | POA: Diagnosis not present

## 2017-03-09 DIAGNOSIS — F17210 Nicotine dependence, cigarettes, uncomplicated: Secondary | ICD-10-CM | POA: Diagnosis not present

## 2017-03-09 DIAGNOSIS — F17200 Nicotine dependence, unspecified, uncomplicated: Secondary | ICD-10-CM | POA: Diagnosis not present

## 2017-03-09 DIAGNOSIS — R079 Chest pain, unspecified: Secondary | ICD-10-CM | POA: Insufficient documentation

## 2017-03-09 DIAGNOSIS — Z79899 Other long term (current) drug therapy: Secondary | ICD-10-CM | POA: Diagnosis not present

## 2017-03-09 LAB — BASIC METABOLIC PANEL
ANION GAP: 6 (ref 5–15)
BUN: 10 mg/dL (ref 6–20)
CALCIUM: 9.1 mg/dL (ref 8.9–10.3)
CO2: 29 mmol/L (ref 22–32)
Chloride: 102 mmol/L (ref 101–111)
Creatinine, Ser: 0.94 mg/dL (ref 0.61–1.24)
GLUCOSE: 90 mg/dL (ref 65–99)
Potassium: 4 mmol/L (ref 3.5–5.1)
Sodium: 137 mmol/L (ref 135–145)

## 2017-03-09 LAB — TROPONIN I

## 2017-03-09 LAB — CBC
HEMATOCRIT: 37.2 % — AB (ref 40.0–52.0)
HEMOGLOBIN: 12.5 g/dL — AB (ref 13.0–18.0)
MCH: 28.8 pg (ref 26.0–34.0)
MCHC: 33.6 g/dL (ref 32.0–36.0)
MCV: 85.7 fL (ref 80.0–100.0)
Platelets: 293 10*3/uL (ref 150–440)
RBC: 4.34 MIL/uL — AB (ref 4.40–5.90)
RDW: 14.3 % (ref 11.5–14.5)
WBC: 10.3 10*3/uL (ref 3.8–10.6)

## 2017-03-09 MED ORDER — PREDNISONE 20 MG PO TABS: 40.0000 mg | ORAL_TABLET | Freq: Every day | ORAL | 0 refills | Status: AC

## 2017-03-09 MED ORDER — IOPAMIDOL (ISOVUE-370) INJECTION 76%
75.0000 mL | Freq: Once | INTRAVENOUS | Status: AC | PRN
  Administered 2017-03-09: 75 mL via INTRAVENOUS

## 2017-03-09 NOTE — ED Notes (Signed)
MD aware of HTN, pt takes medicine daily , has not taken dose yet today

## 2017-03-09 NOTE — ED Notes (Signed)
Pt verbalizes d/c  Teaching and follow up with rx. PT in NAD at time of d/c, VS stable. PT denies any further concerns regarding this visit. Pt with mother, ambulatory to lobby

## 2017-03-09 NOTE — ED Triage Notes (Signed)
Pt reports that he has swelling on the left side of his neck and chest for a month. Denies any injury to neck and chest.

## 2017-03-09 NOTE — ED Provider Notes (Addendum)
[facility] Emergency Department Provider Note    First MD Initiated Contact with Patient 03/09/17 1637     (approximate)  I have reviewed the triage vital signs and the nursing notes.   HISTORY  Chief Complaint Chest Pain (swelling of neck and chest )    HPI [name] is a 46 y.o. male with a history of chronic pain as well as a heavy smoking history presents from home with chief complaint of chest pain that has been going on for the last month associated with left-sided clavicular swelling left pectoralis pain.  States the pain is moderate in severity and achy.  Denies any fevers.  No trauma.  Patient is concerned he has cancer.  Denies any cough.  No nausea or vomiting.  No numbness or tingling.  Past Medical History:  Diagnosis Date  . Anxiety   . Chronic pain   . Head injury   . Hypertension   . Insomnia   . Poor historian   . Reflux   . Seizures ([place])    Family History  Problem Relation Age of Onset  . Cancer Mother   . Cancer Father   . Alcohol abuse Father   . Schizophrenia Brother    Past Surgical History:  Procedure Laterality Date  . ABDOMINAL SURGERY    . BRAIN SURGERY     Patient Active Problem List   Diagnosis Date Noted  . Borderline diabetes mellitus 08/27/2015  . Combined fat and carbohydrate induced hyperlipemia 08/27/2015  . Anxiety 05/21/2015  . Moderate episode of recurrent major depressive disorder ([place]) 05/21/2015  . Seizure ([place]) 05/21/2015  . Cognitive decline 04/30/2015  . Abdominal distension 02/24/2015  . Hyponatremia 02/24/2015  . HTN (hypertension) 02/24/2015  . Hx of traumatic brain injury 02/24/2015  . HTN (hypertension), benign 02/24/2015  . Abdominal distension (gaseous) 02/24/2015  . Essential (primary) hypertension 02/24/2015  . Hypo-osmolality and hyponatremia 02/24/2015  . Personal history of traumatic brain injury 02/24/2015  . Insomnia, persistent 01/09/2015      Prior to Admission  medications   Medication Sig Start Date End Date Taking? Authorizing Provider  albuterol (PROVENTIL HFA;VENTOLIN HFA) 108 (90 Base) MCG/ACT inhaler Inhale 2 puffs into the lungs every 6 (six) hours as needed for wheezing or shortness of breath. 07/16/15   Triplett, Cari B, FNP  amantadine (SYMMETREL) 100 MG capsule Take 1 capsule (100 mg total) by mouth 2 (two) times daily. 12/05/16   Rainey Pines, MD  baclofen (LIORESAL) 10 MG tablet Take by mouth.    [provider]  citalopram (CELEXA) 10 MG tablet Take 1 tablet (10 mg total) by mouth daily. 02/06/17   Rainey Pines, MD  lamoTRIgine (LAMICTAL) 150 MG tablet Take 1 tablet (150 mg total) by mouth 2 (two) times daily. 02/06/17   Rainey Pines, MD  levETIRAcetam (KEPPRA) 500 MG tablet Take 500 mg by mouth daily. 08/25/14   [provider]  levETIRAcetam (KEPPRA) 500 MG tablet Take by mouth. 02/03/16 05/03/16  [provider]  lisinopril (PRINIVIL,ZESTRIL) 10 MG tablet Take by mouth. 02/02/16 02/01/17  [provider]  omeprazole (PRILOSEC) 20 MG capsule Take 40 mg by mouth every morning. 08/25/14   [provider]  QUEtiapine (SEROQUEL) 200 MG tablet Take 1 tablet (200 mg total) by mouth at bedtime. 02/06/17   Rainey Pines, MD  senna (SENOKOT) 8.6 MG TABS tablet Take 1 tablet (8.6 mg total) by mouth 2 (two) times daily. Patient not taking: Reported on 02/06/2017  02/25/15   Epifanio Lesches, MD  traZODone (DESYREL) 150 MG tablet Take 1 tablet (150 mg total) by mouth at bedtime as needed for sleep. 02/06/17   Rainey Pines, MD  traZODone (DESYREL) 50 MG tablet Daily prn 02/06/17   Rainey Pines, MD    Allergies Amitriptyline    Social History Social History   Tobacco Use  . Smoking status: Current Every Day Smoker    Packs/day: 1.00    Types: Cigarettes    Start date: 05/21/1990  . Smokeless tobacco: Former Systems developer    Quit date: 05/20/1985  Substance Use Topics  . Alcohol use: Yes    Alcohol/week: 1.2  oz    Types: 2 Shots of liquor per week    Comment: a couple 6 packs beer daily  . Drug use: No    Comment: occ    Review of Systems Patient denies headaches, rhinorrhea, blurry vision, numbness, shortness of breath, chest pain, edema, cough, abdominal pain, nausea, vomiting, diarrhea, dysuria, fevers, rashes or hallucinations unless otherwise stated above in HPI. ____________________________________________   PHYSICAL EXAM:  VITAL SIGNS: Vitals:   03/09/17 1443 03/09/17 1645  BP: (!) 198/99   Pulse: 70 73  Resp: 16 15  Temp: 98.2 F (36.8 C)   SpO2: 100% 99%    Constitutional: Alert and oriented. Well appearing and in no acute distress. Eyes: Conjunctivae are normal.  Head: Atraumatic. Nose: No congestion/rhinnorhea. Mouth/Throat: Mucous membranes are moist.   Neck: No stridor. Painless ROM.  No appreciable masses, bruits or pulsatile mass Cardiovascular: Normal rate, regular rhythm. Grossly normal heart sounds.  Good peripheral circulation. Respiratory: Normal respiratory effort.  No retractions. Lungs with coarse breathsounds throughout but no increased WOB Gastrointestinal: Soft and nontender. No distention. No abdominal bruits. No CVA tenderness.  Musculoskeletal: No lower extremity tenderness nor edema.  No joint effusions. Neurologic:  Normal speech and language. No gross focal neurologic deficits are appreciated. No facial droop Skin:  Skin is warm, dry and intact. No rash noted. Psychiatric: Mood and affect are normal. Speech and behavior are normal.  ____________________________________________   LABS (all labs ordered are listed, but only abnormal results are displayed)  Results for orders placed or performed during the hospital encounter of 03/09/17 (from the past 24 hour(s))  Basic metabolic panel     Status: None   Collection Time: 03/09/17  2:48 PM  Result Value Ref Range   Sodium 137 135 - 145 mmol/L   Potassium 4.0 3.5 - 5.1 mmol/L   Chloride 102  101 - 111 mmol/L   CO2 29 22 - 32 mmol/L   Glucose, Bld 90 65 - 99 mg/dL   BUN 10 6 - 20 mg/dL   Creatinine, Ser 0.94 0.61 - 1.24 mg/dL   Calcium 9.1 8.9 - 10.3 mg/dL   GFR calc non Af Amer >60 >60 mL/min   GFR calc Af Amer >60 >60 mL/min   Anion gap 6 5 - 15  CBC     Status: Abnormal   Collection Time: 03/09/17  2:48 PM  Result Value Ref Range   WBC 10.3 3.8 - 10.6 K/uL   RBC 4.34 (L) 4.40 - 5.90 MIL/uL   Hemoglobin 12.5 (L) 13.0 - 18.0 g/dL   HCT 37.2 (L) 40.0 - 52.0 %   MCV 85.7 80.0 - 100.0 fL   MCH 28.8 26.0 - 34.0 pg   MCHC 33.6 32.0 - 36.0 g/dL   RDW 14.3 11.5 - 14.5 %   Platelets 293 150 - 440  K/uL  Troponin I     Status: None   Collection Time: 03/09/17  2:48 PM  Result Value Ref Range   Troponin I <0.03 <0.03 ng/mL   ____________________________________________  EKG My review and personal interpretation at Time: 14:51   Indication: chest pain  Rate: 70  Rhythm: sinus Axis: normal Other: normal intervals, no stemi, poor r wave progression ____________________________________________  RADIOLOGY  I personally reviewed all radiographic images ordered to evaluate for the above acute complaints and reviewed radiology reports and findings.  These findings were personally discussed with the patient.  Please see medical record for radiology report.  ____________________________________________   PROCEDURES  Procedure(s) performed:  Procedures    Critical Care performed: no ____________________________________________   INITIAL IMPRESSION / ASSESSMENT AND PLAN / ED COURSE  Pertinent labs & imaging results that were available during my care of the patient were reviewed by me and considered in my medical decision making (see chart for details).  DDX: ACS, pericarditis, esophagitis, boerhaaves, pe, dissection, pna, bronchitis, costochondritis   [name] is a 46 y.o. who presents to the ED with 1 month of chest pain and concern for swelling left side of  his neck.  On my exam I do not appreciate any swelling.  There is no nodules.  No mass.  No bruits or pulsatile mass.  Chest x-ray shows no abnormality.  Patient does have probable component of underlying bronchitis given his smoking history.  No evidence of ACS.  CT imaging to exclude pulmonary embolism as he is at high risk given his smoking history as well as for evaluation of any evidence of aneurysm that could explain the swelling shows only nonspecific nodules.  Patient otherwise stable for follow-up with PCP.      ____________________________________________   FINAL CLINICAL IMPRESSION(S) / ED DIAGNOSES  Final diagnoses:  Atypical chest pain      NEW MEDICATIONS STARTED DURING THIS VISIT:  This SmartLink is deprecated. Use AVSMEDLIST instead to display the medication list for a patient.   Note:  This document was prepared using Dragon voice recognition software and may include unintentional dictation errors.    Merlyn Lot, MD 03/09/17 Tresa Moore    Merlyn Lot, MD 03/09/17 [phone]

## 2017-03-27 DIAGNOSIS — K1379 Other lesions of oral mucosa: Secondary | ICD-10-CM | POA: Diagnosis not present

## 2017-03-28 DIAGNOSIS — J449 Chronic obstructive pulmonary disease, unspecified: Secondary | ICD-10-CM | POA: Diagnosis not present

## 2017-03-28 DIAGNOSIS — R0602 Shortness of breath: Secondary | ICD-10-CM | POA: Diagnosis not present

## 2017-03-28 DIAGNOSIS — R911 Solitary pulmonary nodule: Secondary | ICD-10-CM | POA: Diagnosis not present

## 2017-05-08 ENCOUNTER — Ambulatory Visit: Payer: Medicare HMO | Admitting: Psychiatry

## 2017-05-08 ENCOUNTER — Encounter: Payer: Self-pay | Admitting: Psychiatry

## 2017-05-08 ENCOUNTER — Other Ambulatory Visit: Payer: Self-pay

## 2017-05-08 VITALS — BP 147/118 | HR 76 | Temp 97.7°F | Wt 220.0 lb

## 2017-05-08 DIAGNOSIS — S0990XS Unspecified injury of head, sequela: Secondary | ICD-10-CM | POA: Diagnosis not present

## 2017-05-08 DIAGNOSIS — F39 Unspecified mood [affective] disorder: Secondary | ICD-10-CM

## 2017-05-08 DIAGNOSIS — F070 Personality change due to known physiological condition: Secondary | ICD-10-CM

## 2017-05-08 DIAGNOSIS — IMO0001 Reserved for inherently not codable concepts without codable children: Secondary | ICD-10-CM

## 2017-05-08 MED ORDER — CITALOPRAM HYDROBROMIDE 10 MG PO TABS: 10.0000 mg | ORAL_TABLET | Freq: Every day | ORAL | 1 refills | Status: DC

## 2017-05-08 MED ORDER — LAMOTRIGINE 150 MG PO TABS: 150.0000 mg | ORAL_TABLET | Freq: Every day | ORAL | 1 refills | Status: DC

## 2017-05-08 MED ORDER — TRAZODONE HCL 50 MG PO TABS: ORAL_TABLET | ORAL | 1 refills | Status: DC

## 2017-05-08 MED ORDER — ATOMOXETINE HCL 25 MG PO CAPS: 25.0000 mg | ORAL_CAPSULE | Freq: Every day | ORAL | 1 refills | Status: DC

## 2017-05-08 MED ORDER — QUETIAPINE FUMARATE 200 MG PO TABS: 200.0000 mg | ORAL_TABLET | Freq: Every day | ORAL | 2 refills | Status: DC

## 2017-05-08 MED ORDER — TRAZODONE HCL 150 MG PO TABS: 150.0000 mg | ORAL_TABLET | Freq: Every evening | ORAL | 3 refills | Status: DC | PRN

## 2017-05-08 NOTE — Progress Notes (Signed)
Psychiatric MD Progress Note   Patient Identification: [name] MRN:  [id number] Date of Evaluation:  05/08/2017 Referral Source: [facility] Chief Complaint:   Chief Complaint    Follow-up; Medication Refill; Weight Gain     Visit Diagnosis:    ICD-10-CM   1. Episodic mood disorder ([place]) F39   2. 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 Borderline diabetes mellitus [R73.03] 08/27/2015  . Combined fat and carbohydrate induced hyperlipemia [E78.2] 08/27/2015  . Anxiety [F41.9] 05/21/2015  . Moderate episode of recurrent major depressive disorder ([place]) [F33.1] 05/21/2015  . Seizure ([place]) [R56.9] 05/21/2015  . Cognitive decline [R41.89] 04/30/2015  . Abdominal distension [R14.0] 02/24/2015  . Hyponatremia [E87.1] 02/24/2015  . HTN (hypertension) [I10] 02/24/2015  . Hx of traumatic brain injury [Z87.820] 02/24/2015  . HTN (hypertension), benign [I10] 02/24/2015  . Abdominal distension (gaseous) [R14.0] 02/24/2015  . Essential (primary) hypertension [I10] 02/24/2015  . Hypo-osmolality and hyponatremia [E87.1] 02/24/2015  . Personal history of traumatic brain injury [Z87.820] 02/24/2015  . Insomnia, persistent [G47.00] 01/09/2015   History of Present Illness:    Patient is a 47 year old white male who presented  for the follow-up. He was accompanied by his mother  Patient reported that he is doing well and he has stopped taking the [place]. He reported that he has also decrease the dose of lamotrigine and he is doing well. He reported that he is concerned as he has some issues with his friends and he does want to break their  trust . He reported that he does not want to have relationship issues with them and have increased anxiety when they share their secrets with him.  He also have some attention problems as he has been working on making a cane for himself from the wood but he  cannot focus on his work for a long period of time. 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 He reported that he has been sleeping well at this time. He takes trazodone 50 mg on a when necessary basis in addition to his regular dose. He has been compliant with his medication. He currently denied having any suicidal homicidal ideation or plan. He denied having any perceptual disturbances at this time.    His mother remains supportive of him.   Past Medical History:  Past Medical History:  Diagnosis Date  . Anxiety   . Chronic pain   . Head injury   . Hypertension   . Insomnia   . Poor historian   . Reflux   . Seizures ([place])     Past Surgical History:  Procedure Laterality Date  . ABDOMINAL SURGERY    . BRAIN SURGERY     Family History:  Family History  Problem Relation Age of Onset  . Cancer Mother   . Cancer Father   . Alcohol abuse Father   . Schizophrenia Brother    Social History:   Social History   Socioeconomic History  . Marital status: Single    Spouse name: None  . Number of children: None  . Years of education: None  . Highest education level: None  Social Needs  . Financial resource strain: None  . Food insecurity - worry:  None  . Food insecurity - inability: None  . Transportation needs - medical: None  . Transportation needs - non-medical: None  Occupational History  . None  Tobacco Use  . Smoking status: Current Every Day Smoker    Packs/day: 1.00    Types: Cigarettes    Start date: 05/21/1990  . Smokeless tobacco: Former Systems developer    Quit date: 05/20/1985  Substance and Sexual Activity  . Alcohol use: Yes    Alcohol/week: 1.2 oz    Types: 2 Shots of liquor per week    Comment: a couple 6 packs beer daily  . Drug use: No    Comment: occ  . Sexual activity: Not Currently  Other Topics Concern   . None  Social History Narrative   ** Merged History Encounter **       Additional Social History:  Currently lives with his mother who is very supportive. 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 He currently denied any pending legal charges. He denied using any drugs or alcohol at this time  Musculoskeletal: Strength & Muscle Tone: within normal limits Gait & Station: normal Patient leans: N/A  Psychiatric Specialty Exam: Medication Refill   Insomnia  PMH includes: depression.  Anxiety  Symptoms include insomnia.    Depression         Associated symptoms include insomnia.  Past medical history includes anxiety.     Review of Systems  Psychiatric/Behavioral: Positive for depression. The patient has insomnia.     Blood pressure (!) 147/118, pulse 76, temperature 97.7 F (36.5 C), temperature source Oral, weight 220 lb (99.8 kg).Body mass index is 29.03 kg/m.  General Appearance: Casual  Eye Contact:  Fair  Speech:  Clear and Coherent  Volume:  Normal  Mood:  Anxious  Affect:  Depressed  Thought Process:  Goal Directed  Orientation:  Full (Time, Place, and Person)  Thought Content:  WDL  Suicidal Thoughts:  No  Homicidal Thoughts:  No  Memory:  imparied  Judgement:  Impaired  Insight:  Lacking  Psychomotor Activity:  Normal  Concentration:  Fair  Recall:  [employer] of Knowledge:Fair  Language: Fair  Akathisia:  No  Handed:  Right  AIMS (if indicated):    Assets:  Desire for Improvement Financial Resources/Insurance Social Support  ADL's:  Intact  Cognition: WNL  Sleep:  fair   Is the patient at risk to self?  No. Has the patient been a risk to self in the past 6 months?  No. Has the patient been a risk to self within the distant past?  No. Is the patient a risk to others?  No. Has the patient been a risk to others in the past 6 months?  No. Has the patient been a risk to others  within the distant past?  No.  Allergies:   Allergies  Allergen Reactions  . Amitriptyline Other (See Comments)    Paralysis of pt    Current Medications: Current Outpatient Medications  Medication Sig Dispense Refill  . albuterol (PROVENTIL HFA;VENTOLIN HFA) 108 (90 Base) MCG/ACT inhaler Inhale 2 puffs into the lungs every 6 (six) hours as needed for wheezing or shortness of breath. 1 Inhaler 2  . amantadine (SYMMETREL) 100 MG capsule Take 1 capsule (100 mg total) by mouth 2 (two) times daily. 180 capsule 2  . baclofen (LIORESAL) 10 MG tablet Take by mouth.    Marland Kitchen  citalopram (CELEXA) 10 MG tablet Take 1 tablet (10 mg total) by mouth daily. 90 tablet 1  . lamoTRIgine (LAMICTAL) 150 MG tablet Take 1 tablet (150 mg total) by mouth 2 (two) times daily. 180 tablet 1  . levETIRAcetam (KEPPRA) 500 MG tablet Take 500 mg by mouth daily.    Marland Kitchen omeprazole (PRILOSEC) 20 MG capsule Take 40 mg by mouth every morning.    Marland Kitchen QUEtiapine (SEROQUEL) 200 MG tablet Take 1 tablet (200 mg total) by mouth at bedtime. 90 tablet 2  . senna (SENOKOT) 8.6 MG TABS tablet Take 1 tablet (8.6 mg total) by mouth 2 (two) times daily. 120 each 0  . traZODone (DESYREL) 150 MG tablet Take 1 tablet (150 mg total) by mouth at bedtime as needed for sleep. 90 tablet 3  . traZODone (DESYREL) 50 MG tablet Daily prn 90 tablet 1  . levETIRAcetam (KEPPRA) 500 MG tablet Take by mouth.    Marland Kitchen lisinopril (PRINIVIL,ZESTRIL) 10 MG tablet Take by mouth.     No current facility-administered medications for this visit.     Previous Psychotropic Medications: He is currently taking BuSpar and trazodone Celexa. He is also on Tegretol for his seizure disorder  Substance Abuse History in the last 12 months:  Yes.    Beer - last month   Consequences of Substance Abuse: Legal Consequences:  spent most of my life in prison  Medical Decision Making:  Review of Psycho-Social Stressors (1) and Review and summation of old records (2)  Treatment  Plan Summary: Medication management   Continue medications as follows  Celexa 10 mg by mouth daily Continue  Seroquel 200 mg at bedtime Trazodone 150 mg at bedtime I will also prescribe him trazodone 50 mg when necessary. Start Strattera 25 mg po qdaily to help with his attention/ concentration.    He will  follow-up in 2  months or earlier depending on his symptoms   More than 50% of the time spent in psychoeducation, counseling and coordination of care.      This note was generated in part or whole with voice recognition software. Voice regonition is usually quite accurate but there are transcription errors that can and very often do occur. I apologize for any typographical errors that were not detected and corrected.    Rainey Pines, MD  2/25/201910:11 AM

## 2017-05-09 ENCOUNTER — Ambulatory Visit: Payer: Medicare HMO | Admitting: Psychiatry

## 2017-05-30 DIAGNOSIS — R7303 Prediabetes: Secondary | ICD-10-CM | POA: Diagnosis not present

## 2017-05-30 DIAGNOSIS — E782 Mixed hyperlipidemia: Secondary | ICD-10-CM | POA: Diagnosis not present

## 2017-05-30 DIAGNOSIS — Z862 Personal history of diseases of the blood and blood-forming organs and certain disorders involving the immune mechanism: Secondary | ICD-10-CM | POA: Diagnosis not present

## 2017-05-30 DIAGNOSIS — I10 Essential (primary) hypertension: Secondary | ICD-10-CM | POA: Diagnosis not present

## 2017-06-01 DIAGNOSIS — R7303 Prediabetes: Secondary | ICD-10-CM | POA: Diagnosis not present

## 2017-06-01 DIAGNOSIS — Z862 Personal history of diseases of the blood and blood-forming organs and certain disorders involving the immune mechanism: Secondary | ICD-10-CM | POA: Diagnosis not present

## 2017-06-01 DIAGNOSIS — E782 Mixed hyperlipidemia: Secondary | ICD-10-CM | POA: Diagnosis not present

## 2017-06-01 DIAGNOSIS — I10 Essential (primary) hypertension: Secondary | ICD-10-CM | POA: Diagnosis not present

## 2017-06-29 ENCOUNTER — Emergency Department
Admission: EM | Admit: 2017-06-29 | Discharge: 2017-06-29 | Disposition: A | Discharge status: Home / Self Care | Payer: Medicare HMO | Attending: Emergency Medicine | Admitting: Emergency Medicine

## 2017-06-29 ENCOUNTER — Encounter: Payer: Self-pay | Admitting: Emergency Medicine

## 2017-06-29 DIAGNOSIS — S1096XA Insect bite of unspecified part of neck, initial encounter: Secondary | ICD-10-CM | POA: Diagnosis present

## 2017-06-29 DIAGNOSIS — W57XXXA Bitten or stung by nonvenomous insect and other nonvenomous arthropods, initial encounter: Secondary | ICD-10-CM | POA: Insufficient documentation

## 2017-06-29 DIAGNOSIS — Z79899 Other long term (current) drug therapy: Secondary | ICD-10-CM | POA: Diagnosis not present

## 2017-06-29 DIAGNOSIS — Y9389 Activity, other specified: Secondary | ICD-10-CM | POA: Diagnosis not present

## 2017-06-29 DIAGNOSIS — Y999 Unspecified external cause status: Secondary | ICD-10-CM | POA: Diagnosis not present

## 2017-06-29 DIAGNOSIS — Y9289 Other specified places as the place of occurrence of the external cause: Secondary | ICD-10-CM | POA: Insufficient documentation

## 2017-06-29 DIAGNOSIS — I10 Essential (primary) hypertension: Secondary | ICD-10-CM | POA: Diagnosis not present

## 2017-06-29 DIAGNOSIS — F17210 Nicotine dependence, cigarettes, uncomplicated: Secondary | ICD-10-CM | POA: Diagnosis not present

## 2017-06-29 DIAGNOSIS — S0006XA Insect bite (nonvenomous) of scalp, initial encounter: Secondary | ICD-10-CM

## 2017-06-29 MED ORDER — DOXYCYCLINE HYCLATE 100 MG PO TABS
100.0000 mg | ORAL_TABLET | Freq: Once | ORAL | Status: AC
  Administered 2017-06-29: 100 mg via ORAL
  Filled 2017-06-29: qty 1

## 2017-06-29 MED ORDER — HYDROXYZINE HCL 50 MG PO TABS
50.0000 mg | ORAL_TABLET | Freq: Once | ORAL | Status: AC
  Administered 2017-06-29: 50 mg via ORAL
  Filled 2017-06-29: qty 1

## 2017-06-29 MED ORDER — DOXYCYCLINE MONOHYDRATE 100 MG PO CAPS: 100.0000 mg | ORAL_CAPSULE | Freq: Two times a day (BID) | ORAL | 0 refills | Status: DC

## 2017-06-29 MED ORDER — HYDROXYZINE HCL 50 MG PO TABS: 50.0000 mg | ORAL_TABLET | Freq: Three times a day (TID) | ORAL | 0 refills | Status: DC | PRN

## 2017-06-29 MED ORDER — TRAMADOL HCL 50 MG PO TABS
50.0000 mg | ORAL_TABLET | Freq: Once | ORAL | Status: AC
  Administered 2017-06-29: 50 mg via ORAL
  Filled 2017-06-29: qty 1

## 2017-06-29 NOTE — ED Notes (Signed)
AAOx3.  Skin warm and dry no apparent distress 

## 2017-06-29 NOTE — ED Triage Notes (Signed)
Pt comes into the ED via POV c/o insect bites to the back of his neck and on his arms.  Patient states he pulled a tick off of him last month and he is concerned its that.  Patient in NAD at this time.

## 2017-06-29 NOTE — ED Provider Notes (Signed)
[facility] Emergency Department Provider Note   ____________________________________________   First MD Initiated Contact with Patient 06/29/17 1551     (approximate)  I have reviewed the triage vital signs and the nursing notes.   HISTORY  Chief Complaint Insect Bite    HPI [name] is a 47 y.o. male patient concerned secondary to take a multiple insect bites to the back of his neck and arms.  Patient today pulled off a tick last month and is concerned because he did not have it evaluated.  Patient denies fever, arthralgia, or myalgia.  Patient is a poor historian secondary to his history of traumatic brain injury.  Mother supplying most of the information.   Past Medical History:  Diagnosis Date  . Anxiety   . Chronic pain   . Head injury   . Hypertension   . Insomnia   . Poor historian   . Reflux   . Seizures [facility])     Patient Active Problem List   Diagnosis Date Noted  . Borderline diabetes mellitus 08/27/2015  . Combined fat and carbohydrate induced hyperlipemia 08/27/2015  . Anxiety 05/21/2015  . Moderate episode of recurrent major depressive disorder ([place]) 05/21/2015  . Seizure ([place]) 05/21/2015  . Cognitive decline 04/30/2015  . Abdominal distension 02/24/2015  . Hyponatremia 02/24/2015  . HTN (hypertension) 02/24/2015  . Hx of traumatic brain injury 02/24/2015  . HTN (hypertension), benign 02/24/2015  . Abdominal distension (gaseous) 02/24/2015  . Essential (primary) hypertension 02/24/2015  . Hypo-osmolality and hyponatremia 02/24/2015  . Personal history of traumatic brain injury 02/24/2015  . Insomnia, persistent 01/09/2015    Past Surgical History:  Procedure Laterality Date  . ABDOMINAL SURGERY    . BRAIN SURGERY      Prior to Admission medications   Medication Sig Start Date End Date Taking? Authorizing Provider  albuterol (PROVENTIL HFA;VENTOLIN HFA) 108 (90 Base) MCG/ACT inhaler Inhale 2 puffs into the  lungs every 6 (six) hours as needed for wheezing or shortness of breath. 07/16/15   Triplett, Johnette Abraham B, FNP  atomoxetine (STRATTERA) 25 MG capsule Take 1 capsule (25 mg total) by mouth daily. 05/08/17   Rainey Pines, MD  baclofen (LIORESAL) 10 MG tablet Take by mouth.    [provider]  citalopram (CELEXA) 10 MG tablet Take 1 tablet (10 mg total) by mouth daily. 05/08/17   Rainey Pines, MD  doxycycline (MONODOX) 100 MG capsule Take 1 capsule (100 mg total) by mouth 2 (two) times daily. 06/29/17   Sable Feil, PA-C  hydrOXYzine (ATARAX/VISTARIL) 50 MG tablet Take 1 tablet (50 mg total) by mouth 3 (three) times daily as needed. 06/29/17   Sable Feil, PA-C  lamoTRIgine (LAMICTAL) 150 MG tablet Take 1 tablet (150 mg total) by mouth daily. 05/08/17   Rainey Pines, MD  levETIRAcetam (KEPPRA) 500 MG tablet Take 500 mg by mouth daily. 08/25/14   [provider]  levETIRAcetam (KEPPRA) 500 MG tablet Take by mouth. 02/03/16 05/03/16  [provider]  lisinopril (PRINIVIL,ZESTRIL) 10 MG tablet Take by mouth. 02/02/16 02/01/17  [provider]  omeprazole (PRILOSEC) 20 MG capsule Take 40 mg by mouth every morning. 08/25/14   [provider]  QUEtiapine (SEROQUEL) 200 MG tablet Take 1 tablet (200 mg total) by mouth at bedtime. 05/08/17   Rainey Pines, MD  senna (SENOKOT) 8.6 MG TABS tablet Take 1 tablet (8.6 mg total) by mouth 2 (two) times daily. 02/25/15   Epifanio Lesches, MD  traZODone (DESYREL) 150 MG tablet Take 1 tablet (150 mg total) by mouth at bedtime as needed for sleep. 05/08/17   Rainey Pines, MD  traZODone (DESYREL) 50 MG tablet Daily prn 05/08/17   Rainey Pines, MD    Allergies Amitriptyline  Family History  Problem Relation Age of Onset  . Cancer Mother   . Cancer Father   . Alcohol abuse Father   . Schizophrenia Brother     Social History Social History   Tobacco Use  . Smoking status: Current Every Day Smoker    Packs/day: 1.00     Types: Cigarettes    Start date: 05/21/1990  . Smokeless tobacco: Former Systems developer    Quit date: 05/20/1985  Substance Use Topics  . Alcohol use: Yes    Alcohol/week: 1.2 oz    Types: 2 Shots of liquor per week    Comment: a couple 6 packs beer daily  . Drug use: No    Types: Marijuana    Comment: occ    Review of Systems Constitutional: No fever/chills Eyes: No visual changes. ENT: No sore throat. Cardiovascular: Denies chest pain. Respiratory: Denies shortness of breath. Gastrointestinal: No abdominal pain.  No nausea, no vomiting.  No diarrhea.  No constipation. Genitourinary: Negative for dysuria. Musculoskeletal: Negative for back pain. Skin: Negative for rash. Neurological: Negative for headaches, focal weakness or numbness. Psychiatric:Anxiety Endocrine:Hypertension Hematological/Lymphatic: Allergic/Immunilogical: Amitriptyline ____________________________________________   PHYSICAL EXAM:  VITAL SIGNS: ED Triage Vitals  Enc Vitals Group     BP 06/29/17 1547 (!) 169/98     Pulse Rate 06/29/17 1546 100     Resp 06/29/17 1546 17     Temp 06/29/17 1546 98.3 F (36.8 C)     Temp Source 06/29/17 1546 Oral     SpO2 06/29/17 1546 96 %     Weight 06/29/17 1546 220 lb (99.8 kg)     Height 06/29/17 1546 6\' 1"  (1.854 m)     Head Circumference --      Peak Flow --      Pain Score 06/29/17 1546 2     Pain Loc --      Pain Edu? --      Excl. in [place]? --    Constitutional: Alert and oriented. Well appearing and in no acute distress. Eyes: Conjunctivae are normal. PERRL. EOMI. Head: Atraumatic. Nose: No congestion/rhinnorhea. Mouth/Throat: Mucous membranes are moist.  Oropharynx non-erythematous. Neck: No stridor.  No cervical spine tenderness to palpation. Hematological/Lymphatic/Immunilogical: No cervical lymphadenopathy. Cardiovascular: Normal rate, regular rhythm. Grossly normal heart sounds.  Good peripheral circulation. Respiratory: Normal respiratory effort.  No  retractions. Lungs CTAB. Musculoskeletal: No lower extremity tenderness nor edema.  No joint effusions. Neurologic:  Normal speech and language. No gross focal neurologic deficits are appreciated. No gait instability. Skin: Erythematous papular lesions bilateral upper extremities and posterior neck.   Psychiatric: Mood and affect are normal. Speech and behavior are normal.  ____________________________________________   LABS (all labs ordered are listed, but only abnormal results are displayed)  Labs Reviewed - No data to display ____________________________________________  EKG   ____________________________________________  RADIOLOGY  ED MD interpretation:    Official radiology report(s): No results found.  ____________________________________________   PROCEDURES  Procedure(s) performed: None  Procedures  Critical Care performed: No  ____________________________________________   INITIAL IMPRESSION / ASSESSMENT AND PLAN / ED COURSE  As part of my medical decision making, I reviewed the following data within the [place]    Patient presents with multiple  insect bites to the upper extremities and posterior neck.  No abscess.  Patient stated 1 of the insect was a tick which she removed 1 month ago.  Patient given discharge care instruction.  Patient given a prescription for doxycycline and Atarax.  Patient advised follow-up PCP for continued care.      ____________________________________________   FINAL CLINICAL IMPRESSION(S) / ED DIAGNOSES  Final diagnoses:  Tick bite, initial encounter  Insect bite of scalp, initial encounter     ED Discharge Orders        Ordered    doxycycline (MONODOX) 100 MG capsule  2 times daily     06/29/17 1603    hydrOXYzine (ATARAX/VISTARIL) 50 MG tablet  3 times daily PRN     06/29/17 1603       Note:  This document was prepared using Dragon voice recognition software and may include  unintentional dictation errors.    Sable Feil, PA-C 06/29/17 1610    Nena Polio, MD 06/29/17 2014

## 2017-07-03 ENCOUNTER — Ambulatory Visit: Payer: Medicare HMO | Admitting: Psychiatry

## 2017-07-03 DIAGNOSIS — J302 Other seasonal allergic rhinitis: Secondary | ICD-10-CM | POA: Diagnosis not present

## 2017-07-06 ENCOUNTER — Other Ambulatory Visit: Payer: Self-pay | Admitting: Psychiatry

## 2017-07-10 ENCOUNTER — Ambulatory Visit: Payer: Medicare HMO | Admitting: Psychiatry

## 2017-07-10 ENCOUNTER — Encounter: Payer: Self-pay | Admitting: Psychiatry

## 2017-07-10 ENCOUNTER — Other Ambulatory Visit: Payer: Self-pay

## 2017-07-10 VITALS — BP 134/92 | HR 97 | Temp 98.8°F | Wt 207.8 lb

## 2017-07-10 DIAGNOSIS — IMO0001 Reserved for inherently not codable concepts without codable children: Secondary | ICD-10-CM

## 2017-07-10 DIAGNOSIS — F39 Unspecified mood [affective] disorder: Secondary | ICD-10-CM

## 2017-07-10 DIAGNOSIS — F070 Personality change due to known physiological condition: Secondary | ICD-10-CM

## 2017-07-10 DIAGNOSIS — S0990XS Unspecified injury of head, sequela: Secondary | ICD-10-CM | POA: Diagnosis not present

## 2017-07-10 MED ORDER — TRAZODONE HCL 50 MG PO TABS: ORAL_TABLET | ORAL | 1 refills | Status: DC

## 2017-07-10 MED ORDER — LAMOTRIGINE 150 MG PO TABS: 150.0000 mg | ORAL_TABLET | Freq: Every day | ORAL | 1 refills | Status: DC

## 2017-07-10 MED ORDER — CITALOPRAM HYDROBROMIDE 10 MG PO TABS: 10.0000 mg | ORAL_TABLET | Freq: Every day | ORAL | 1 refills | Status: DC

## 2017-07-10 MED ORDER — TRAZODONE HCL 150 MG PO TABS
150.0000 mg | ORAL_TABLET | Freq: Every evening | ORAL | 3 refills | Status: DC | PRN
Start: 2017-07-10 — End: 2017-11-06

## 2017-07-10 MED ORDER — ATOMOXETINE HCL 25 MG PO CAPS: 25.0000 mg | ORAL_CAPSULE | Freq: Every day | ORAL | 1 refills | Status: DC

## 2017-07-10 MED ORDER — QUETIAPINE FUMARATE 200 MG PO TABS: 200.0000 mg | ORAL_TABLET | Freq: Every day | ORAL | 2 refills | Status: DC

## 2017-07-10 NOTE — Progress Notes (Signed)
Psychiatric MD Progress Note   Patient Identification: [name] MRN:  [id number] Date of Evaluation:  07/10/2017 Referral Source: [facility] Chief Complaint:   Chief Complaint    Follow-up; Medication Refill     Visit Diagnosis:    ICD-10-CM   1. Episodic mood disorder ([place]) F39   2. 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 Borderline diabetes mellitus [R73.03] 08/27/2015  . Combined fat and carbohydrate induced hyperlipemia [E78.2] 08/27/2015  . Anxiety [F41.9] 05/21/2015  . Moderate episode of recurrent major depressive disorder ([place]) [F33.1] 05/21/2015  . Seizure ([place]) [R56.9] 05/21/2015  . Cognitive decline [R41.89] 04/30/2015  . Abdominal distension [R14.0] 02/24/2015  . Hyponatremia [E87.1] 02/24/2015  . HTN (hypertension) [I10] 02/24/2015  . Hx of traumatic brain injury [Z87.820] 02/24/2015  . HTN (hypertension), benign [I10] 02/24/2015  . Abdominal distension (gaseous) [R14.0] 02/24/2015  . Essential (primary) hypertension [I10] 02/24/2015  . Hypo-osmolality and hyponatremia [E87.1] 02/24/2015  . Personal history of traumatic brain injury [Z87.820] 02/24/2015  . Insomnia, persistent [G47.00] 01/09/2015   History of Present Illness:    Patient is a 47 year old white male who presented  for the follow-up. He was accompanied by his mother  Patient reported that he had a tick bite earlier this month and he was feeling worse.  He went to the emergency room where he was given a prescription of doxycycline.  He reported that he was drinking as well and was incarcerated for a couple of times as he was trying to put alcohol on his tick bites.  His mother called the police on him.  His mother reported that he was not doing well at that time.  His mother reported that he was red and hot and was having high fevers so he is was taken to the emergency room for evaluation.   Finally patient was able to respond to the antibiotics and hydroxyzine.  Patient was concerned about his tick bite.  He reported that he is doing well on his psychotropic medications and does not want to change them.  His mother remains supportive.  He currently denied having any suicidal or homicidal ideations or plans.  He is able to control his symptoms at this time.  He reported that he has also lost some weight as he was not eating during this episode.     He reported that he has been sleeping well at this time. He takes trazodone 50 mg on a when necessary basis in addition to his regular dose. He has been compliant with his medication. He currently denied having any suicidal homicidal ideation or plan. He denied having any perceptual disturbances at this time.    His mother remains supportive of him.   Past Medical History:  Past Medical History:  Diagnosis Date  . Anxiety   . Chronic pain   . Head injury   . Hypertension   . Insomnia   . Poor historian   . Reflux   . Seizures ([place])     Past Surgical History:  Procedure Laterality Date  . ABDOMINAL SURGERY    . BRAIN SURGERY     Family History:  Family History  Problem Relation Age of Onset  . Cancer Mother   . Cancer Father   . Alcohol abuse Father   . Schizophrenia Brother    Social History:   Social History   Socioeconomic  History  . Marital status: Single    Spouse name: Not on file  . Number of children: Not on file  . Years of education: Not on file  . Highest education level: Not on file  Occupational History  . Not on file  Social Needs  . Financial resource strain: Not on file  . Food insecurity:    Worry: Not on file    Inability: Not on file  . Transportation needs:    Medical: Not on file    Non-medical: Not on file  Tobacco Use  . Smoking status: Current Every Day Smoker    Packs/day: 1.00    Types: Cigarettes    Start date: 05/21/1990  . Smokeless tobacco: Former Systems developer    Quit date: 05/20/1985   Substance and Sexual Activity  . Alcohol use: Yes    Alcohol/week: 1.2 oz    Types: 2 Shots of liquor per week    Comment: a couple 6 packs beer daily  . Drug use: No    Types: Marijuana    Comment: occ  . Sexual activity: Not Currently  Lifestyle  . Physical activity:    Days per week: Not on file    Minutes per session: Not on file  . Stress: Not on file  Relationships  . Social connections:    Talks on phone: Not on file    Gets together: Not on file    Attends religious service: Not on file    Active member of club or organization: Not on file    Attends meetings of clubs or organizations: Not on file    Relationship status: Not on file  Other Topics Concern  . Not on file  Social History Narrative   ** Merged History Encounter **       Additional Social History:  Currently lives with his mother who is very supportive. 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 He currently denied any pending legal charges. He denied using any drugs or alcohol at this time  Musculoskeletal: Strength & Muscle Tone: within normal limits Gait & Station: normal Patient leans: N/A  Psychiatric Specialty Exam: Medication Refill   Insomnia  PMH includes: depression.  Anxiety  Symptoms include insomnia.    Depression         Associated symptoms include insomnia.  Past medical history includes anxiety.     Review of Systems  Psychiatric/Behavioral: Positive for depression. The patient has insomnia.     Blood pressure (!) 134/92, pulse 97, temperature 98.8 F (37.1 C), temperature source Oral, weight 207 lb 12.8 oz (94.3 kg).Body mass index is 27.42 kg/m.  General Appearance: Casual  Eye Contact:  Fair  Speech:  Clear and Coherent  Volume:  Normal  Mood:  Anxious  Affect:  Depressed  Thought Process:  Goal Directed  Orientation:  Full (Time, Place, and Person)  Thought Content:  WDL  Suicidal Thoughts:  No   Homicidal Thoughts:  No  Memory:  imparied  Judgement:  Impaired  Insight:  Lacking  Psychomotor Activity:  Normal  Concentration:  Fair  Recall:  [employer] of Knowledge:Fair  Language: Fair  Akathisia:  No  Handed:  Right  AIMS (if indicated):    Assets:  Desire for Improvement Financial Resources/Insurance Social Support  ADL's:  Intact  Cognition: WNL  Sleep:  fair   Is the patient at risk to self?  No. Has the patient been a risk to self in the past 6 months?  No. Has the patient been a risk to self within the distant past?  No. Is the patient a risk to others?  No. Has the patient been a risk to others in the past 6 months?  No. Has the patient been a risk to others within the distant past?  No.  Allergies:   Allergies  Allergen Reactions  . Amitriptyline Other (See Comments)    Paralysis of pt    Current Medications: Current Outpatient Medications  Medication Sig Dispense Refill  . albuterol (PROVENTIL HFA;VENTOLIN HFA) 108 (90 Base) MCG/ACT inhaler Inhale 2 puffs into the lungs every 6 (six) hours as needed for wheezing or shortness of breath. 1 Inhaler 2  . atomoxetine (STRATTERA) 25 MG capsule Take 1 capsule (25 mg total) by mouth daily. 30 capsule 1  . baclofen (LIORESAL) 10 MG tablet Take by mouth.    . citalopram (CELEXA) 10 MG tablet Take 1 tablet (10 mg total) by mouth daily. 90 tablet 1  . doxycycline (MONODOX) 100 MG capsule Take 1 capsule (100 mg total) by mouth 2 (two) times daily. 20 capsule 0  . hydrOXYzine (ATARAX/VISTARIL) 50 MG tablet Take 1 tablet (50 mg total) by mouth 3 (three) times daily as needed. 30 tablet 0  . lamoTRIgine (LAMICTAL) 150 MG tablet Take 1 tablet (150 mg total) by mouth daily. 90 tablet 1  . levETIRAcetam (KEPPRA) 500 MG tablet Take 500 mg by mouth daily.    Marland Kitchen omeprazole (PRILOSEC) 20 MG capsule Take 40 mg by mouth every morning.    Marland Kitchen QUEtiapine (SEROQUEL) 200 MG tablet Take 1 tablet (200 mg total) by mouth at bedtime. 90  tablet 2  . senna (SENOKOT) 8.6 MG TABS tablet Take 1 tablet (8.6 mg total) by mouth 2 (two) times daily. 120 each 0  . traZODone (DESYREL) 150 MG tablet Take 1 tablet (150 mg total) by mouth at bedtime as needed for sleep. 90 tablet 3  . traZODone (DESYREL) 50 MG tablet Daily prn 90 tablet 1  . levETIRAcetam (KEPPRA) 500 MG tablet Take by mouth.    Marland Kitchen lisinopril (PRINIVIL,ZESTRIL) 10 MG tablet Take by mouth.     No current facility-administered medications for this visit.     Previous Psychotropic Medications: He is currently taking BuSpar and trazodone Celexa. He is also on Tegretol for his seizure disorder  Substance Abuse History in the last 12 months:  Yes.    Beer - last month   Consequences of Substance Abuse: Legal Consequences:  spent most of my life in prison  Medical Decision Making:  Review of Psycho-Social Stressors (1) and Review and summation of old records (2)  Treatment Plan Summary: Medication management   Continue medications as follows  Celexa 10 mg by mouth daily Continue  Seroquel 200 mg at bedtime Trazodone 150 mg at bedtime I will also prescribe him trazodone 50 mg when necessary.  Strattera 25 mg po qdaily to help with his attention/ concentration.    He will  follow-up in 2  months or earlier depending on his symptoms   More than 50% of the time spent in psychoeducation, counseling and coordination of care.      This note was generated in part or whole with voice recognition software. Voice regonition is usually quite accurate but there are transcription errors that can and very often do occur. I apologize for any typographical errors that were not detected and  corrected.    Rainey Pines, MD  4/29/20192:33 PM

## 2017-07-17 DIAGNOSIS — K120 Recurrent oral aphthae: Secondary | ICD-10-CM | POA: Diagnosis not present

## 2017-07-17 DIAGNOSIS — W57XXXA Bitten or stung by nonvenomous insect and other nonvenomous arthropods, initial encounter: Secondary | ICD-10-CM | POA: Diagnosis not present

## 2017-07-19 DIAGNOSIS — H524 Presbyopia: Secondary | ICD-10-CM | POA: Diagnosis not present

## 2017-08-23 ENCOUNTER — Other Ambulatory Visit: Payer: Self-pay | Admitting: Psychiatry

## 2017-08-24 NOTE — Telephone Encounter (Signed)
pt mother called left message that pt will not have enough medication to due until his next appt for the strattera

## 2017-08-28 ENCOUNTER — Telehealth: Payer: Self-pay

## 2017-08-28 DIAGNOSIS — F908 Attention-deficit hyperactivity disorder, other type: Secondary | ICD-10-CM

## 2017-08-28 MED ORDER — ATOMOXETINE HCL 25 MG PO CAPS: 25.0000 mg | ORAL_CAPSULE | Freq: Every day | ORAL | 1 refills | Status: DC

## 2017-08-28 NOTE — Telephone Encounter (Signed)
Reviewed Dr.Faheem's notes . I have sent Strattera for 30 days to his pharmacy.

## 2017-08-28 NOTE — Telephone Encounter (Signed)
Medication refill request - Telephone call from pt's Mother, whom he lives with to report patient will run out of Strattera on 09/02/17 and does not see Dr. Gretel Acre until 10/09/17. Requests refills to last until he returns as others were 90 day orders.  Agreed to send request to another provider in the office as Dr. Gretel Acre is out until 09/04/17 and will contact patient back once reviewed with another provider.

## 2017-08-28 NOTE — Telephone Encounter (Signed)
Medication management - Telephone call to inform patient's requested Strattera refill + 1 additional refill had been sent into patient's requested pharmacy by Dr. Shea Evans this date.

## 2017-10-02 ENCOUNTER — Ambulatory Visit: Payer: Medicare HMO | Admitting: Psychiatry

## 2017-10-09 ENCOUNTER — Ambulatory Visit: Payer: Medicare HMO | Admitting: Psychiatry

## 2017-10-12 ENCOUNTER — Encounter: Payer: Self-pay | Admitting: Emergency Medicine

## 2017-10-12 ENCOUNTER — Emergency Department: Payer: Medicare HMO

## 2017-10-12 ENCOUNTER — Other Ambulatory Visit: Payer: Self-pay

## 2017-10-12 ENCOUNTER — Emergency Department
Admission: EM | Admit: 2017-10-12 | Discharge: 2017-10-12 | Disposition: A | Discharge status: Home / Self Care | Payer: Medicare HMO | Attending: Emergency Medicine | Admitting: Emergency Medicine

## 2017-10-12 DIAGNOSIS — T148XXA Other injury of unspecified body region, initial encounter: Secondary | ICD-10-CM

## 2017-10-12 DIAGNOSIS — R911 Solitary pulmonary nodule: Secondary | ICD-10-CM | POA: Diagnosis not present

## 2017-10-12 DIAGNOSIS — F17210 Nicotine dependence, cigarettes, uncomplicated: Secondary | ICD-10-CM | POA: Insufficient documentation

## 2017-10-12 DIAGNOSIS — R0781 Pleurodynia: Secondary | ICD-10-CM | POA: Diagnosis not present

## 2017-10-12 DIAGNOSIS — E119 Type 2 diabetes mellitus without complications: Secondary | ICD-10-CM | POA: Diagnosis not present

## 2017-10-12 DIAGNOSIS — Z79899 Other long term (current) drug therapy: Secondary | ICD-10-CM | POA: Diagnosis not present

## 2017-10-12 DIAGNOSIS — S29001A Unspecified injury of muscle and tendon of front wall of thorax, initial encounter: Secondary | ICD-10-CM | POA: Diagnosis not present

## 2017-10-12 DIAGNOSIS — M25511 Pain in right shoulder: Secondary | ICD-10-CM | POA: Insufficient documentation

## 2017-10-12 DIAGNOSIS — R0789 Other chest pain: Secondary | ICD-10-CM | POA: Diagnosis not present

## 2017-10-12 DIAGNOSIS — R918 Other nonspecific abnormal finding of lung field: Secondary | ICD-10-CM | POA: Diagnosis not present

## 2017-10-12 DIAGNOSIS — I10 Essential (primary) hypertension: Secondary | ICD-10-CM | POA: Diagnosis not present

## 2017-10-12 DIAGNOSIS — S46911A Strain of unspecified muscle, fascia and tendon at shoulder and upper arm level, right arm, initial encounter: Secondary | ICD-10-CM | POA: Diagnosis not present

## 2017-10-12 MED ORDER — BACLOFEN 10 MG PO TABS: 10.0000 mg | ORAL_TABLET | Freq: Every day | ORAL | 0 refills | Status: DC

## 2017-10-12 MED ORDER — ACETAMINOPHEN 500 MG PO TABS: 500.0000 mg | ORAL_TABLET | Freq: Four times a day (QID) | ORAL | 0 refills | Status: DC | PRN
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
## 2017-10-12 NOTE — ED Provider Notes (Signed)
EKG read interpreted by me shows normal sinus rhythm rate of 83 normal axis no acute ST-T wave changes   Nena Polio, MD 10/12/17 1536
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
## 2017-10-30 ENCOUNTER — Other Ambulatory Visit: Payer: Self-pay | Admitting: Psychiatry

## 2017-10-31 MED ORDER — ATOMOXETINE HCL 25 MG PO CAPS: 25.0000 mg | ORAL_CAPSULE | Freq: Every day | ORAL | 0 refills | Status: DC

## 2017-10-31 NOTE — Telephone Encounter (Signed)
Will sent 15 pills for Strattera, pt has appointment to be seen by Dr.Faheem on 8/26.

## 2017-11-06 ENCOUNTER — Other Ambulatory Visit: Payer: Self-pay

## 2017-11-06 ENCOUNTER — Encounter: Payer: Self-pay | Admitting: Psychiatry

## 2017-11-06 ENCOUNTER — Ambulatory Visit: Payer: Medicare HMO | Admitting: Psychiatry

## 2017-11-06 VITALS — BP 124/84 | HR 72 | Temp 97.5°F | Wt 200.2 lb

## 2017-11-06 DIAGNOSIS — F070 Personality change due to known physiological condition: Secondary | ICD-10-CM

## 2017-11-06 DIAGNOSIS — F908 Attention-deficit hyperactivity disorder, other type: Secondary | ICD-10-CM

## 2017-11-06 DIAGNOSIS — F39 Unspecified mood [affective] disorder: Secondary | ICD-10-CM | POA: Diagnosis not present

## 2017-11-06 DIAGNOSIS — S0990XS Unspecified injury of head, sequela: Secondary | ICD-10-CM | POA: Diagnosis not present

## 2017-11-06 DIAGNOSIS — IMO0001 Reserved for inherently not codable concepts without codable children: Secondary | ICD-10-CM

## 2017-11-06 MED ORDER — LAMOTRIGINE 150 MG PO TABS: 150.0000 mg | ORAL_TABLET | Freq: Every day | ORAL | 1 refills | Status: DC

## 2017-11-06 MED ORDER — ATOMOXETINE HCL 60 MG PO CAPS: 60.0000 mg | ORAL_CAPSULE | Freq: Every day | ORAL | 2 refills | Status: DC

## 2017-11-06 MED ORDER — TRAZODONE HCL 50 MG PO TABS: ORAL_TABLET | ORAL | 1 refills | Status: DC

## 2017-11-06 MED ORDER — TRAZODONE HCL 150 MG PO TABS: 150.0000 mg | ORAL_TABLET | Freq: Every evening | ORAL | 3 refills | Status: DC | PRN

## 2017-11-06 MED ORDER — QUETIAPINE FUMARATE 50 MG PO TABS: 50.0000 mg | ORAL_TABLET | Freq: Every day | ORAL | 2 refills | Status: DC

## 2017-11-06 MED ORDER — QUETIAPINE FUMARATE 200 MG PO TABS: 200.0000 mg | ORAL_TABLET | Freq: Every day | ORAL | 2 refills | Status: DC

## 2017-11-06 NOTE — Progress Notes (Signed)
Psychiatric MD Progress Note   Patient Identification: [name] MRN:  [id number] Date of Evaluation:  11/06/2017 Referral Source: [facility] Chief Complaint:    Visit Diagnosis:    ICD-10-CM   1. Episodic mood disorder ([place]) F39   2. 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 Attention deficit hyperactivity disorder (ADHD), other type F90.8    Diagnosis:   Patient Active Problem List   Diagnosis Date Noted  . Borderline diabetes mellitus [R73.03] 08/27/2015  . Combined fat and carbohydrate induced hyperlipemia [E78.2] 08/27/2015  . Anxiety [F41.9] 05/21/2015  . Moderate episode of recurrent major depressive disorder ([place]) [F33.1] 05/21/2015  . Seizure ([place]) [R56.9] 05/21/2015  . Cognitive decline [R41.89] 04/30/2015  . Abdominal distension [R14.0] 02/24/2015  . Hyponatremia [E87.1] 02/24/2015  . HTN (hypertension) [I10] 02/24/2015  . Hx of traumatic brain injury [Z87.820] 02/24/2015  . HTN (hypertension), benign [I10] 02/24/2015  . Abdominal distension (gaseous) [R14.0] 02/24/2015  . Essential (primary) hypertension [I10] 02/24/2015  . Hypo-osmolality and hyponatremia [E87.1] 02/24/2015  . Personal history of traumatic brain injury [Z87.820] 02/24/2015  . Insomnia, persistent [G47.00] 01/09/2015   History of Present Illness:    Patient is a 47 year old white male who presented  for the follow-up. He was accompanied by his mother.  Patient reported that he is having difficult time sleeping at night and also having mood swings.  His mother reported that he is not taking Strattera on a regular basis.  He has she currently refill his medications and has been given a refill on the Strattera.  We discussed about his medications.  He stated that he wakes up around 2 AM and has difficult time going back to sleep.  Reviewed his medications.  Mother reported that she has been helping him compliance with his medications.  He currently denied having any  suicidal ideations or plans.  He is planning to go to review his medications with his neurologist as well as he has not seen him in at least 6 months.    She currently denied having any suicidal homicidal ideations or plans.  He denied having any perceptual disturbances.  He is able to contract for safety at this time.  He is willing to have his medications adjusted.  Marland Kitchen    His mother remains supportive of him.   Past Medical History:  Past Medical History:  Diagnosis Date  . Anxiety   . Chronic pain   . Head injury   . Hypertension   . Insomnia   . Poor historian   . Reflux   . Seizures ([place])     Past Surgical History:  Procedure Laterality Date  . ABDOMINAL SURGERY    . BRAIN SURGERY     Family History:  Family History  Problem Relation Age of Onset  . Cancer Mother   . Cancer Father   . Alcohol abuse Father   . Schizophrenia Brother    Social History:   Social History   Socioeconomic History  . Marital status: Single    Spouse name: Not on file  . Number of children: Not on file  . Years of education: Not on file  . Highest education level: Not on file  Occupational History  . Not on file  Social Needs  . Financial resource strain: Not on file  . Food insecurity:    Worry: Not on file    Inability: Not on file  . Transportation needs:  Medical: Not on file    Non-medical: Not on file  Tobacco Use  . Smoking status: Current Every Day Smoker    Packs/day: 1.00    Types: Cigarettes    Start date: 05/21/1990  . Smokeless tobacco: Former Systems developer    Quit date: 05/20/1985  Substance and Sexual Activity  . Alcohol use: Yes    Alcohol/week: 2.0 standard drinks    Types: 2 Shots of liquor per week    Comment: a couple 6 packs beer daily  . Drug use: No    Types: Marijuana    Comment: occ  . Sexual activity: Not Currently  Lifestyle  . Physical activity:    Days per week: Not on file    Minutes per session: Not on file  . Stress: Not on file  Relationships   . Social connections:    Talks on phone: Not on file    Gets together: Not on file    Attends religious service: Not on file    Active member of club or organization: Not on file    Attends meetings of clubs or organizations: Not on file    Relationship status: Not on file  Other Topics Concern  . Not on file  Social History Narrative   ** Merged History Encounter **       Additional Social History:  Currently lives with his mother who is very supportive. 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 He currently denied any pending legal charges. He denied using any drugs or alcohol at this time  Musculoskeletal: Strength & Muscle Tone: within normal limits Gait & Station: normal Patient leans: N/A  Psychiatric Specialty Exam: Medication Refill   Insomnia  PMH includes: depression.  Anxiety  Symptoms include insomnia.    Depression         Associated symptoms include insomnia.  Past medical history includes anxiety.     Review of Systems  Psychiatric/Behavioral: Positive for depression. The patient has insomnia.     There were no vitals taken for this visit.There is no height or weight on file to calculate BMI.  General Appearance: Casual  Eye Contact:  Fair  Speech:  Clear and Coherent  Volume:  Normal  Mood:  Anxious  Affect:  Depressed  Thought Process:  Goal Directed  Orientation:  Full (Time, Place, and Person)  Thought Content:  WDL  Suicidal Thoughts:  No  Homicidal Thoughts:  No  Memory:  imparied  Judgement:  Impaired  Insight:  Lacking  Psychomotor Activity:  Normal  Concentration:  Fair  Recall:  [employer] of Knowledge:Fair  Language: Fair  Akathisia:  No  Handed:  Right  AIMS (if indicated):    Assets:  Desire for Improvement Financial Resources/Insurance Social Support  ADL's:  Intact  Cognition: WNL  Sleep:  fair   Is the patient at risk to self?  No. Has the patient  been a risk to self in the past 6 months?  No. Has the patient been a risk to self within the distant past?  No. Is the patient a risk to others?  No. Has the patient been a risk to others in the past 6 months?  No. Has the patient been a risk to others within the distant past?  No.  Allergies:   Allergies  Allergen Reactions  . Amitriptyline Other (See Comments)    Paralysis of pt  Current Medications: Current Outpatient Medications  Medication Sig Dispense Refill  . acetaminophen (TYLENOL) 500 MG tablet Take 1 tablet (500 mg total) by mouth every 6 (six) hours as needed. 30 tablet 0  . albuterol (PROVENTIL HFA;VENTOLIN HFA) 108 (90 Base) MCG/ACT inhaler Inhale 2 puffs into the lungs every 6 (six) hours as needed for wheezing or shortness of breath. 1 Inhaler 2  . atomoxetine (STRATTERA) 25 MG capsule Take 1 capsule (25 mg total) by mouth daily. 30 capsule 1  . atomoxetine (STRATTERA) 25 MG capsule Take 1 capsule (25 mg total) by mouth daily. 15 capsule 0  . baclofen (LIORESAL) 10 MG tablet Take 1 tablet (10 mg total) by mouth daily. 30 tablet 0  . citalopram (CELEXA) 10 MG tablet Take 1 tablet (10 mg total) by mouth daily. 90 tablet 1  . doxycycline (MONODOX) 100 MG capsule Take 1 capsule (100 mg total) by mouth 2 (two) times daily. 20 capsule 0  . lamoTRIgine (LAMICTAL) 150 MG tablet Take 1 tablet (150 mg total) by mouth daily. 90 tablet 1  . levETIRAcetam (KEPPRA) 500 MG tablet Take by mouth.    Marland Kitchen lisinopril (PRINIVIL,ZESTRIL) 10 MG tablet Take by mouth.    Marland Kitchen omeprazole (PRILOSEC) 20 MG capsule Take 40 mg by mouth every morning.    Marland Kitchen QUEtiapine (SEROQUEL) 200 MG tablet Take 1 tablet (200 mg total) by mouth at bedtime. 90 tablet 2  . senna (SENOKOT) 8.6 MG TABS tablet Take 1 tablet (8.6 mg total) by mouth 2 (two) times daily. 120 each 0  . traZODone (DESYREL) 150 MG tablet Take 1 tablet (150 mg total) by mouth at bedtime as needed for sleep. 90 tablet 3  . traZODone (DESYREL) 50 MG  tablet Daily prn 90 tablet 1   No current facility-administered medications for this visit.     Previous Psychotropic Medications: He is currently taking BuSpar and trazodone Celexa. He is also on Tegretol for his seizure disorder  Substance Abuse History in the last 12 months:  Yes.    Beer - last month   Consequences of Substance Abuse: Legal Consequences:  spent most of my life in prison  Medical Decision Making:  Review of Psycho-Social Stressors (1) and Review and summation of old records (2)  Treatment Plan Summary: Medication management   Continue medications as follows  Celexa 10 mg by mouth daily Continue  Seroquel 200 mg at bedtime. I will add Seroquel 50 mg at bedtime. Trazodone 150 mg at bedtime.  Additional 50 mg is also prescribed. I will increase the dose of Strattera 50 mg and he was given a prescription of 60 mg for the next month.  He agreed with the plan. Patient will follow-up in 2 months or earlier depending on his symptoms.      He will  follow-up in 2  months or earlier depending on his symptoms   More than 50% of the time spent in psychoeducation, counseling and coordination of care.      This note was generated in part or whole with voice recognition software. Voice regonition is usually quite accurate but there are transcription errors that can and very often do occur. I apologize for any typographical errors that were not detected and corrected.    Rainey Pines, MD  8/26/20199:46 AM

## 2017-11-30 DIAGNOSIS — E782 Mixed hyperlipidemia: Secondary | ICD-10-CM | POA: Diagnosis not present

## 2017-11-30 DIAGNOSIS — I10 Essential (primary) hypertension: Secondary | ICD-10-CM | POA: Diagnosis not present

## 2017-11-30 DIAGNOSIS — R7303 Prediabetes: Secondary | ICD-10-CM | POA: Diagnosis not present

## 2017-12-04 ENCOUNTER — Ambulatory Visit: Payer: Medicare HMO | Admitting: Psychiatry

## 2017-12-04 ENCOUNTER — Other Ambulatory Visit: Payer: Self-pay

## 2017-12-04 ENCOUNTER — Encounter: Payer: Self-pay | Admitting: Psychiatry

## 2017-12-04 VITALS — BP 126/83 | HR 80 | Temp 97.6°F | Wt 204.6 lb

## 2017-12-04 DIAGNOSIS — S0990XS Unspecified injury of head, sequela: Secondary | ICD-10-CM | POA: Diagnosis not present

## 2017-12-04 DIAGNOSIS — F070 Personality change due to known physiological condition: Secondary | ICD-10-CM

## 2017-12-04 DIAGNOSIS — IMO0001 Reserved for inherently not codable concepts without codable children: Secondary | ICD-10-CM

## 2017-12-04 DIAGNOSIS — F39 Unspecified mood [affective] disorder: Secondary | ICD-10-CM | POA: Diagnosis not present

## 2017-12-04 MED ORDER — CITALOPRAM HYDROBROMIDE 10 MG PO TABS: 10.0000 mg | ORAL_TABLET | Freq: Every day | ORAL | 1 refills | Status: DC

## 2017-12-04 MED ORDER — TRAZODONE HCL 50 MG PO TABS: ORAL_TABLET | ORAL | 1 refills | Status: DC

## 2017-12-04 MED ORDER — ATOMOXETINE HCL 60 MG PO CAPS: 60.0000 mg | ORAL_CAPSULE | Freq: Every day | ORAL | 2 refills | Status: DC

## 2017-12-04 MED ORDER — QUETIAPINE FUMARATE 50 MG PO TABS: 50.0000 mg | ORAL_TABLET | Freq: Every day | ORAL | 2 refills | Status: DC

## 2017-12-04 NOTE — Progress Notes (Signed)
Psychiatric MD Progress Note   Patient Identification: [name] MRN:  [id number] Date of Evaluation:  12/04/2017 Referral Source: [facility] Chief Complaint:   Chief Complaint    Follow-up; Medication Refill     Visit Diagnosis:    ICD-10-CM   1. Episodic mood disorder ([place]) F39   2. 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 Borderline diabetes mellitus [R73.03] 08/27/2015  . Combined fat and carbohydrate induced hyperlipemia [E78.2] 08/27/2015  . Anxiety [F41.9] 05/21/2015  . Moderate episode of recurrent major depressive disorder ([place]) [F33.1] 05/21/2015  . Seizure ([place]) [R56.9] 05/21/2015  . Cognitive decline [R41.89] 04/30/2015  . Abdominal distension [R14.0] 02/24/2015  . Hyponatremia [E87.1] 02/24/2015  . HTN (hypertension) [I10] 02/24/2015  . Hx of traumatic brain injury [Z87.820] 02/24/2015  . HTN (hypertension), benign [I10] 02/24/2015  . Abdominal distension (gaseous) [R14.0] 02/24/2015  . Essential (primary) hypertension [I10] 02/24/2015  . Hypo-osmolality and hyponatremia [E87.1] 02/24/2015  . Personal history of traumatic brain injury [Z87.820] 02/24/2015  . Insomnia, persistent [G47.00] 01/09/2015   History of Present Illness:    Patient is a 47 year old white male who presented  for the follow-up. He was accompanied by his mother.  Patient reported that he noticed went in his symptoms.  He was talking about some flea bites which are random and his mother reported that he spends most of this time outside with his brother.  Patient reported that he is interested in learning about computers as he wants to earn some money.  He reported that he is having some financial difficulty at this time.  He was focused about the same.  We discussed about learning from his niece and nephews and he agreed with the plan.  His mother reported that he has just started taking the  Strattera 60 mg and he is doing well.  He has been sleeping well with his current medications.  Patient stated that he is also trying to lose weight and has been eating healthy.  He does not have any side effects of the medications.  His mother helps him with his medications at this time.  He denied having any suicidal homicidal ideations or plans.  He denied having any perceptual disturbances.    Is also following with his neurologist on a regular basis.      His mother remains supportive of him.   Past Medical History:  Past Medical History:  Diagnosis Date  . Anxiety   . Chronic pain   . Head injury   . Hypertension   . Insomnia   . Poor historian   . Reflux   . Seizures ([place])     Past Surgical History:  Procedure Laterality Date  . ABDOMINAL SURGERY    . BRAIN SURGERY     Family History:  Family History  Problem Relation Age of Onset  . Cancer Mother   . Cancer Father   . Alcohol abuse Father   . Schizophrenia Brother    Social History:   Social History   Socioeconomic History  . Marital status: Single    Spouse name: Not on file  . Number of children: Not on file  . Years of education: Not on file  . Highest education level: Not on file  Occupational History  . Not on file  Social Needs  . Financial resource strain: Not on file  . Food  insecurity:    Worry: Not on file    Inability: Not on file  . Transportation needs:    Medical: Not on file    Non-medical: Not on file  Tobacco Use  . Smoking status: Current Every Day Smoker    Packs/day: 1.00    Types: Cigarettes    Start date: 05/21/1990  . Smokeless tobacco: Former Systems developer    Quit date: 05/20/1985  Substance and Sexual Activity  . Alcohol use: Yes    Alcohol/week: 2.0 standard drinks    Types: 2 Shots of liquor per week    Comment: a couple 6 packs beer daily  . Drug use: No    Types: Marijuana    Comment: occ  . Sexual activity: Not Currently  Lifestyle  . Physical activity:    Days per week:  Not on file    Minutes per session: Not on file  . Stress: Not on file  Relationships  . Social connections:    Talks on phone: Not on file    Gets together: Not on file    Attends religious service: Not on file    Active member of club or organization: Not on file    Attends meetings of clubs or organizations: Not on file    Relationship status: Not on file  Other Topics Concern  . Not on file  Social History Narrative   ** Merged History Encounter **       Additional Social History:  Currently lives with his mother who is very supportive. 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 He currently denied any pending legal charges. He denied using any drugs or alcohol at this time  Musculoskeletal: Strength & Muscle Tone: within normal limits Gait & Station: normal Patient leans: N/A  Psychiatric Specialty Exam: Medication Refill   Insomnia  PMH includes: depression.  Anxiety  Symptoms include insomnia.    Depression         Associated symptoms include insomnia.  Past medical history includes anxiety.     Review of Systems  Psychiatric/Behavioral: Positive for depression. The patient has insomnia.     There were no vitals taken for this visit.There is no height or weight on file to calculate BMI.  General Appearance: Casual  Eye Contact:  Fair  Speech:  Clear and Coherent  Volume:  Normal  Mood:  Anxious  Affect:  Depressed  Thought Process:  Goal Directed  Orientation:  Full (Time, Place, and Person)  Thought Content:  WDL  Suicidal Thoughts:  No  Homicidal Thoughts:  No  Memory:  imparied  Judgement:  Impaired  Insight:  Lacking  Psychomotor Activity:  Normal  Concentration:  Fair  Recall:  [employer] of Knowledge:Fair  Language: Fair  Akathisia:  No  Handed:  Right  AIMS (if indicated):    Assets:  Desire for Improvement Financial Resources/Insurance Social Support  ADL's:  Intact   Cognition: WNL  Sleep:  fair   Is the patient at risk to self?  No. Has the patient been a risk to self in the past 6 months?  No. Has the patient been a risk to self within the distant past?  No. Is the patient a risk to others?  No. Has the patient been a risk to others in the past 6 months?  No. Has the patient been a risk to others within the distant past?  No.  Allergies:   Allergies  Allergen Reactions  . Amitriptyline Other (See Comments)    Paralysis of pt    Current Medications: Current Outpatient Medications  Medication Sig Dispense Refill  . albuterol (PROVENTIL HFA;VENTOLIN HFA) 108 (90 Base) MCG/ACT inhaler Inhale 2 puffs into the lungs every 6 (six) hours as needed for wheezing or shortness of breath. 1 Inhaler 2  . atomoxetine (STRATTERA) 60 MG capsule Take 1 capsule (60 mg total) by mouth daily. 30 capsule 2  . citalopram (CELEXA) 10 MG tablet Take 1 tablet (10 mg total) by mouth daily. 90 tablet 1  . lamoTRIgine (LAMICTAL) 150 MG tablet Take 1 tablet (150 mg total) by mouth daily. 90 tablet 1  . omeprazole (PRILOSEC) 20 MG capsule Take 40 mg by mouth every morning.    Marland Kitchen QUEtiapine (SEROQUEL) 200 MG tablet Take 1 tablet (200 mg total) by mouth at bedtime. 90 tablet 2  . QUEtiapine (SEROQUEL) 50 MG tablet Take 1 tablet (50 mg total) by mouth daily. 30 tablet 2  . senna (SENOKOT) 8.6 MG TABS tablet Take 1 tablet (8.6 mg total) by mouth 2 (two) times daily. 120 each 0  . traZODone (DESYREL) 150 MG tablet Take 1 tablet (150 mg total) by mouth at bedtime as needed for sleep. 90 tablet 3  . traZODone (DESYREL) 50 MG tablet Daily prn 90 tablet 1  . lisinopril (PRINIVIL,ZESTRIL) 10 MG tablet Take by mouth.     No current facility-administered medications for this visit.     Previous Psychotropic Medications: He is currently taking BuSpar and trazodone Celexa. He is also on Tegretol for his seizure disorder  Substance Abuse History in the last 12 months:  Yes.    Beer  - last month   Consequences of Substance Abuse: Legal Consequences:  spent most of my life in prison  Medical Decision Making:  Review of Psycho-Social Stressors (1) and Review and summation of old records (2)  Treatment Plan Summary: Medication management   Continue medications as follows  Celexa 10 mg by mouth daily Continue  Seroquel 200 mg at bedtime. Seroquel 50 mg at bedtime. Trazodone 150 mg at bedtime.  Additional 50 mg is also prescribed. Strattera 50 mg and he was given a prescription of 60 mg for the next month.  He agreed with the plan. Patient will follow-up in 2 months or earlier depending on his symptoms.    More than 50% of the time spent in psychoeducation, counseling and coordination of care.      This note was generated in part or whole with voice recognition software. Voice regonition is usually quite accurate but there are transcription errors that can and very often do occur. I apologize for any typographical errors that were not detected and corrected.    Rainey Pines, MD  9/23/20199:58 AM

## 2017-12-07 DIAGNOSIS — F331 Major depressive disorder, recurrent, moderate: Secondary | ICD-10-CM | POA: Diagnosis not present

## 2017-12-07 DIAGNOSIS — Z8719 Personal history of other diseases of the digestive system: Secondary | ICD-10-CM | POA: Diagnosis not present

## 2017-12-07 DIAGNOSIS — Z0000 Encounter for general adult medical examination without abnormal findings: Secondary | ICD-10-CM | POA: Diagnosis not present

## 2017-12-07 DIAGNOSIS — R7303 Prediabetes: Secondary | ICD-10-CM | POA: Diagnosis not present

## 2017-12-07 DIAGNOSIS — E782 Mixed hyperlipidemia: Secondary | ICD-10-CM | POA: Diagnosis not present

## 2017-12-07 DIAGNOSIS — I10 Essential (primary) hypertension: Secondary | ICD-10-CM | POA: Diagnosis not present

## 2017-12-14 DIAGNOSIS — Z79899 Other long term (current) drug therapy: Secondary | ICD-10-CM | POA: Diagnosis not present

## 2017-12-14 DIAGNOSIS — F419 Anxiety disorder, unspecified: Secondary | ICD-10-CM | POA: Diagnosis not present

## 2017-12-14 DIAGNOSIS — R569 Unspecified convulsions: Secondary | ICD-10-CM | POA: Diagnosis not present

## 2017-12-24 ENCOUNTER — Encounter: Payer: Self-pay | Admitting: Emergency Medicine

## 2017-12-24 ENCOUNTER — Other Ambulatory Visit: Payer: Self-pay

## 2017-12-24 ENCOUNTER — Emergency Department
Admission: EM | Admit: 2017-12-24 | Discharge: 2017-12-25 | Disposition: A | Discharge status: Other Acute Inpatient Hospital | Payer: Medicare HMO | Attending: Physician Assistant | Admitting: Physician Assistant

## 2017-12-24 DIAGNOSIS — Z79899 Other long term (current) drug therapy: Secondary | ICD-10-CM | POA: Insufficient documentation

## 2017-12-24 DIAGNOSIS — R21 Rash and other nonspecific skin eruption: Secondary | ICD-10-CM | POA: Diagnosis not present

## 2017-12-24 DIAGNOSIS — R451 Restlessness and agitation: Secondary | ICD-10-CM | POA: Diagnosis not present

## 2017-12-24 DIAGNOSIS — F17210 Nicotine dependence, cigarettes, uncomplicated: Secondary | ICD-10-CM | POA: Diagnosis not present

## 2017-12-24 DIAGNOSIS — Z046 Encounter for general psychiatric examination, requested by authority: Secondary | ICD-10-CM | POA: Diagnosis not present

## 2017-12-24 DIAGNOSIS — F419 Anxiety disorder, unspecified: Secondary | ICD-10-CM | POA: Insufficient documentation

## 2017-12-24 DIAGNOSIS — I10 Essential (primary) hypertension: Secondary | ICD-10-CM | POA: Insufficient documentation

## 2017-12-24 DIAGNOSIS — R45850 Homicidal ideations: Secondary | ICD-10-CM | POA: Insufficient documentation

## 2017-12-24 DIAGNOSIS — R45851 Suicidal ideations: Secondary | ICD-10-CM

## 2017-12-24 DIAGNOSIS — F329 Major depressive disorder, single episode, unspecified: Secondary | ICD-10-CM | POA: Diagnosis not present

## 2017-12-24 DIAGNOSIS — F32A Depression, unspecified: Secondary | ICD-10-CM

## 2017-12-24 LAB — COMPREHENSIVE METABOLIC PANEL
ALBUMIN: 4.2 g/dL (ref 3.5–5.0)
ALT: 21 U/L (ref 0–44)
ANION GAP: 9 (ref 5–15)
AST: 21 U/L (ref 15–41)
Alkaline Phosphatase: 89 U/L (ref 38–126)
BUN: 12 mg/dL (ref 6–20)
CHLORIDE: 103 mmol/L (ref 98–111)
CO2: 28 mmol/L (ref 22–32)
Calcium: 9.1 mg/dL (ref 8.9–10.3)
Creatinine, Ser: 0.94 mg/dL (ref 0.61–1.24)
GFR calc Af Amer: >60 mL/min (ref >60)
GFR calc non Af Amer: >60 mL/min (ref >60)
GLUCOSE: 112 mg/dL — AB (ref 70–99)
POTASSIUM: 4.2 mmol/L (ref 3.5–5.1)
Sodium: 140 mmol/L (ref 135–145)
TOTAL PROTEIN: 7.4 g/dL (ref 6.5–8.1)
Total Bilirubin: 0.5 mg/dL (ref 0.3–1.2)

## 2017-12-24 LAB — URINE DRUG SCREEN, QUALITATIVE (ARMC ONLY)
Amphetamines, Ur Screen: NOT DETECTED
BARBITURATES, UR SCREEN: NOT DETECTED
Benzodiazepine, Ur Scrn: NOT DETECTED
COCAINE METABOLITE, UR ~~LOC~~: NOT DETECTED
Cannabinoid 50 Ng, Ur ~~LOC~~: POSITIVE — AB
MDMA (Ecstasy)Ur Screen: NOT DETECTED
METHADONE SCREEN, URINE: NOT DETECTED
Opiate, Ur Screen: NOT DETECTED
Phencyclidine (PCP) Ur S: NOT DETECTED
TRICYCLIC, UR SCREEN: POSITIVE — AB

## 2017-12-24 LAB — CBC
HCT: 39.4 % (ref 39.0–52.0)
HEMOGLOBIN: 12.7 g/dL — AB (ref 13.0–17.0)
MCH: 27.6 pg (ref 26.0–34.0)
MCHC: 32.2 g/dL (ref 30.0–36.0)
MCV: 85.7 fL (ref 80.0–100.0)
Platelets: 257 10*3/uL (ref 150–400)
RBC: 4.6 MIL/uL (ref 4.22–5.81)
RDW: 14.4 % (ref 11.5–15.5)
WBC: 8.4 10*3/uL (ref 4.0–10.5)
nRBC: 0 % (ref 0.0–0.2)

## 2017-12-24 LAB — ACETAMINOPHEN LEVEL

## 2017-12-24 LAB — ETHANOL: Alcohol, Ethyl (B): <10 mg/dL (ref <10)

## 2017-12-24 LAB — SALICYLATE LEVEL: Salicylate Lvl: <7 mg/dL (ref 2.8–30.0)

## 2017-12-24 MED ORDER — QUETIAPINE FUMARATE 200 MG PO TABS
200.0000 mg | ORAL_TABLET | Freq: Every day | ORAL | Status: DC
  Administered 2017-12-24: 200 mg via ORAL
  Filled 2017-12-24: qty 1

## 2017-12-24 MED ORDER — LORAZEPAM 2 MG/ML IJ SOLN: 2.0000 mg | Freq: Once | INTRAMUSCULAR | Status: DC

## 2017-12-24 MED ORDER — LISINOPRIL 5 MG PO TABS
10.0000 mg | ORAL_TABLET | Freq: Every day | ORAL | Status: DC
  Administered 2017-12-24 – 2017-12-25 (×2): 10 mg via ORAL
  Filled 2017-12-24 (×2): qty 2

## 2017-12-24 MED ORDER — LORAZEPAM 2 MG/ML IJ SOLN
2.0000 mg | Freq: Once | INTRAMUSCULAR | Status: AC
  Administered 2017-12-24: 2 mg via INTRAMUSCULAR
  Filled 2017-12-24: qty 1

## 2017-12-24 MED ORDER — CITALOPRAM HYDROBROMIDE 20 MG PO TABS
10.0000 mg | ORAL_TABLET | Freq: Every day | ORAL | Status: DC
  Administered 2017-12-25: 10 mg via ORAL
  Filled 2017-12-24: qty 1

## 2017-12-24 MED ORDER — PANTOPRAZOLE SODIUM 40 MG PO TBEC
80.0000 mg | DELAYED_RELEASE_TABLET | Freq: Every day | ORAL | Status: DC
  Administered 2017-12-24 – 2017-12-25 (×2): 80 mg via ORAL
  Filled 2017-12-24 (×2): qty 2

## 2017-12-24 MED ORDER — LAMOTRIGINE 100 MG PO TABS
150.0000 mg | ORAL_TABLET | Freq: Every day | ORAL | Status: DC
  Administered 2017-12-24 – 2017-12-25 (×2): 150 mg via ORAL
  Filled 2017-12-24 (×2): qty 2

## 2017-12-24 MED ORDER — TRAZODONE HCL 50 MG PO TABS
150.0000 mg | ORAL_TABLET | Freq: Every evening | ORAL | Status: DC | PRN
  Administered 2017-12-24: 150 mg via ORAL
  Filled 2017-12-24: qty 1

## 2017-12-24 MED ORDER — QUETIAPINE FUMARATE 25 MG PO TABS
50.0000 mg | ORAL_TABLET | Freq: Every day | ORAL | Status: DC
  Administered 2017-12-24 – 2017-12-25 (×2): 50 mg via ORAL
  Filled 2017-12-24 (×2): qty 2

## 2017-12-24 MED ORDER — ATOMOXETINE HCL 60 MG PO CAPS
60.0000 mg | ORAL_CAPSULE | Freq: Every day | ORAL | Status: DC
  Administered 2017-12-24: 60 mg via ORAL
  Filled 2017-12-24 (×2): qty 1

## 2017-12-24 NOTE — ED Notes (Signed)
BEHAVIORAL HEALTH ROUNDING Patient sleeping: No. Patient alert and oriented: yes Behavior appropriate: Yes.  ; If no, describe:  Nutrition and fluids offered: yes Toileting and hygiene offered: Yes  Sitter present: q15 minute observations and security  monitoring Law enforcement present: Yes  ODS  

## 2017-12-24 NOTE — BH Assessment (Signed)
Writer called and informed patient's mother [name] [phone]) updated her of the patient's disposition.

## 2017-12-24 NOTE — ED Triage Notes (Signed)
Mom feels his meds are not working right.

## 2017-12-24 NOTE — BH Assessment (Signed)
Writer called and left a HIPPA Compliant message with mother [name] [phone]), requesting a return phone call.
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
## 2017-12-24 NOTE — ED Notes (Signed)
Pt currently talking with [facility] MD

## 2017-12-24 NOTE — BH Assessment (Addendum)
SOC recommends inpatient treatment. Currently no beds with [facility] and [facility].  Referral information for Psychiatric Hospitalization faxed to;   [facility] [phone]),   . [facility] ([phone]---[phone]),  . [facility] [phone] or [phone]),   . [place] [phone] or [phone])  . [facility] (VZSMO-[phone]), declined by MD (Dr. Selinda Flavin) due to TBI  . [facility] [phone]),   . [place] [phone]),   . [facility] [phone] or [phone])  . [facility] [phone])  . [facility]

## 2017-12-24 NOTE — ED Notes (Signed)
Patient received PM snack. 

## 2017-12-24 NOTE — ED Triage Notes (Signed)
When asked pt what brought him to ED he states "lets just say better be glad I don't have a gun".  Asked patient if wanted to hurt self or someone else and states "it doesn't make a difference".   "my mom brought me down here, if it wasn't for that" and pt just stops talking. Difficult to get clear assessment of patient in triage.

## 2017-12-24 NOTE — ED Notes (Signed)

## 2017-12-24 NOTE — ED Notes (Signed)
Pt is complaining of a rash to his buttocks and bumps on the back of his neck. Says they "burn" and feel similarly to when he was treated for a tick bite and was prescribed doxycycline and hydroxyzine. This may be an ongoing issue for him. Dr. Burlene Arnt notified.

## 2017-12-24 NOTE — ED Notes (Signed)
Medications updated from list that mother provided   [name]  [phone]
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
## 2017-12-24 NOTE — ED Notes (Signed)
Family left cell phone number to contact for updates : [phone]

## 2017-12-24 NOTE — ED Provider Notes (Signed)
[facility] Emergency Department Provider Note ____________________________________________   I have reviewed the triage vital signs and the triage nursing note.  HISTORY  Chief Complaint Suicidal and Homicidal   Historian Patient  HPI [name] is a 47 y.o. male with a history of prior head injury, anxiety, and depression presents stating that he thinks his medications are not working.  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  He is upset that he has been the reason why his siblings moved out of the home where he stays with his mother.  He states he is "pissed."  Patient states he sometimes has suicidal thoughts but no plan.  Right now he states he just feels like taking it out on everyone else.  Symptoms moderate to severe.     Past Medical History:  Diagnosis Date  . Anxiety   . Chronic pain   . Head injury   . Hypertension   . Insomnia   . Poor historian   . Reflux   . Seizures [facility])     Patient Active Problem List   Diagnosis Date Noted  . Borderline diabetes mellitus 08/27/2015  . Combined fat and carbohydrate induced hyperlipemia 08/27/2015  . Anxiety 05/21/2015  . Moderate episode of recurrent major depressive disorder ([place]) 05/21/2015  . Seizure ([place]) 05/21/2015  . Cognitive decline 04/30/2015  . Abdominal distension 02/24/2015  . Hyponatremia 02/24/2015  . HTN (hypertension) 02/24/2015  . Hx of traumatic brain injury 02/24/2015  . HTN (hypertension), benign 02/24/2015  . Abdominal distension (gaseous) 02/24/2015  . Essential (primary) hypertension 02/24/2015  . Hypo-osmolality and hyponatremia 02/24/2015  . Personal history of traumatic brain injury 02/24/2015  . Insomnia, persistent 01/09/2015    Past Surgical History:  Procedure Laterality Date  . ABDOMINAL SURGERY    . BRAIN SURGERY      Prior to Admission medications   Medication Sig Start Date End Date Taking? Authorizing Provider   albuterol (PROVENTIL HFA;VENTOLIN HFA) 108 (90 Base) MCG/ACT inhaler Inhale 2 puffs into the lungs every 6 (six) hours as needed for wheezing or shortness of breath. 07/16/15  Yes Triplett, Cari B, FNP  atomoxetine (STRATTERA) 60 MG capsule Take 1 capsule (60 mg total) by mouth daily. Patient taking differently: Take 60 mg by mouth at bedtime.  12/04/17  Yes Rainey Pines, MD  citalopram (CELEXA) 10 MG tablet Take 1 tablet (10 mg total) by mouth daily. 12/04/17  Yes Rainey Pines, MD  lamoTRIgine (LAMICTAL) 150 MG tablet Take 1 tablet (150 mg total) by mouth daily. 11/06/17  Yes Rainey Pines, MD  lisinopril (PRINIVIL,ZESTRIL) 10 MG tablet Take 10 mg by mouth daily.  02/02/16 12/24/17 Yes [provider]  omeprazole (PRILOSEC) 20 MG capsule Take 40 mg by mouth every morning. 08/25/14  Yes [provider]  QUEtiapine (SEROQUEL) 200 MG tablet Take 1 tablet (200 mg total) by mouth at bedtime. 11/06/17  Yes Rainey Pines, MD  QUEtiapine (SEROQUEL) 50 MG tablet Take 1 tablet (50 mg total) by mouth daily. 12/04/17  Yes Rainey Pines, MD  senna (SENOKOT) 8.6 MG TABS tablet Take 1 tablet (8.6 mg total) by mouth 2 (two) times daily. 02/25/15  Yes Epifanio Lesches, MD  traZODone (DESYREL) 150 MG tablet Take 1 tablet (150 mg total) by mouth at bedtime as needed for sleep. 11/06/17  Yes Rainey Pines, MD  traZODone (DESYREL) 50 MG tablet Daily prn 12/04/17  Yes Rainey Pines, MD    Allergies  Allergen Reactions  .  Amitriptyline Other (See Comments)    Paralysis of pt     Family History  Problem Relation Age of Onset  . Cancer Mother   . Cancer Father   . Alcohol abuse Father   . Schizophrenia Brother     Social History Social History   Tobacco Use  . Smoking status: Current Every Day Smoker    Packs/day: 1.00    Types: Cigarettes    Start date: 05/21/1990  . Smokeless tobacco: Former Systems developer    Quit date: 05/20/1985  Substance Use Topics  . Alcohol use: Yes    Alcohol/week: 2.0 standard  drinks    Types: 2 Shots of liquor per week    Comment: a couple 6 packs beer daily  . Drug use: No    Types: Marijuana    Comment: occ    Review of Systems  Constitutional: Negative for fever. Eyes: Negative for visual changes. ENT: Negative for sore throat. Cardiovascular: Negative for chest pain. Respiratory: Negative for shortness of breath. Gastrointestinal: Negative for abdominal pain, vomiting and diarrhea. Genitourinary: Negative for dysuria. Musculoskeletal: Negative for back pain. Skin: Negative for rash. Neurological: Negative for headache.  ____________________________________________   PHYSICAL EXAM:  VITAL SIGNS: ED Triage Vitals  Enc Vitals Group     BP 12/24/17 1015 (!) 147/91     Pulse Rate 12/24/17 1015 86     Resp 12/24/17 1015 18     Temp 12/24/17 1015 98.2 F (36.8 C)     Temp Source 12/24/17 1015 Oral     SpO2 12/24/17 1015 97 %     Weight 12/24/17 1016 200 lb (90.7 kg)     Height 12/24/17 1016 6' (1.829 m)     Head Circumference --      Peak Flow --      Pain Score 12/24/17 1028 0     Pain Loc --      Pain Edu? --      Excl. in [place]? --      Constitutional: Alert and oriented.  HEENT      Head: Normocephalic and atraumatic.      Eyes: Conjunctivae are normal. Pupils equal and round.       Ears:         Nose: No congestion/rhinnorhea.      Mouth/Throat: Mucous membranes are moist.      Neck: No stridor. Cardiovascular/Chest: Normal rate, regular rhythm.  No murmurs, rubs, or gallops. Respiratory: Normal respiratory effort without tachypnea nor retractions. Breath sounds are clear and equal bilaterally. No wheezes/rales/rhonchi. Gastrointestinal: Soft. No distention, no guarding, no rebound. Nontender.    Genitourinary/rectal:Deferred Musculoskeletal: Nontender with normal range of motion in all extremities. No joint effusions.  No lower extremity tenderness.  No edema. Neurologic:  Normal speech and language. No gross or focal neurologic  deficits are appreciated. Skin:  Skin is warm, dry and intact. No rash noted. Psychiatric: Rocking back and forth, somewhat agitated.  He is redirectable and cooperative to be here for evaluation.  Reports recent suicidal ideation, not currently, positive for homicidal ideation.  ____________________________________________  LABS (pertinent positives/negatives) I, Lisa Roca, MD the attending physician have reviewed the labs noted below.  Labs Reviewed  COMPREHENSIVE METABOLIC PANEL - Abnormal; Notable for the following components:      Result Value   Glucose, Bld 112 (*)    All other components within normal limits  ACETAMINOPHEN LEVEL - Abnormal; Notable for the following components:   Acetaminophen (Tylenol), Serum <10 (*)  All other components within normal limits  CBC - Abnormal; Notable for the following components:   Hemoglobin 12.7 (*)    All other components within normal limits  URINE DRUG SCREEN, QUALITATIVE ([facility] ONLY) - Abnormal; Notable for the following components:   Tricyclic, Ur Screen POSITIVE (*)    Cannabinoid 50 Ng, Ur [place] POSITIVE (*)    All other components within normal limits  ETHANOL  SALICYLATE LEVEL    ____________________________________________    EKG I, Lisa Roca, MD, the attending physician have personally viewed and interpreted all ECGs.  None ____________________________________________  RADIOLOGY   None __________________________________________  PROCEDURES  Procedure(s) performed: None  Procedures  Critical Care performed: None   ____________________________________________  ED COURSE / ASSESSMENT AND PLAN  Pertinent labs & imaging results that were available during my care of the patient were reviewed by me and considered in my medical decision making (see chart for details).   Completed IVC given depression, agitation and suicidal and homocidal ideation --- patient not forthcoming about who he wants to harm.   He  agrees to IM ativan to help with feeling of agitation right now.  I reviewed [place] consult - recommend continue IVC and rec dispo psych hospitalization.  Patient care transferred to Dr. Burlene Arnt at shift change 3pm.    CONSULTATIONS:   TTS and Telepsychiatry.   Patient / Family / Caregiver informed of clinical course, medical decision-making process, and agree with plan.     ___________________________________________   FINAL CLINICAL IMPRESSION(S) / ED DIAGNOSES   Final diagnoses:  Depression, unspecified depression type  Suicidal ideations      ___________________________________________         Note: This dictation was prepared with Dragon dictation. Any transcriptional errors that result from this process are unintentional    Lisa Roca, MD 12/24/17 1511

## 2017-12-24 NOTE — ED Notes (Signed)
Soc md verbalizes pt referral for inpt treatment

## 2017-12-25 ENCOUNTER — Other Ambulatory Visit: Payer: Self-pay

## 2017-12-25 ENCOUNTER — Encounter: Payer: Self-pay | Admitting: *Deleted

## 2017-12-25 ENCOUNTER — Inpatient Hospital Stay
Admission: AD | Admit: 2017-12-25 | Discharge: 2017-12-28 | DRG: 885 | Disposition: A | Discharge status: Home / Self Care | Payer: Medicare HMO | Attending: Psychiatry | Admitting: Psychiatry

## 2017-12-25 DIAGNOSIS — I10 Essential (primary) hypertension: Secondary | ICD-10-CM | POA: Diagnosis present

## 2017-12-25 DIAGNOSIS — E785 Hyperlipidemia, unspecified: Secondary | ICD-10-CM | POA: Diagnosis present

## 2017-12-25 DIAGNOSIS — Z888 Allergy status to other drugs, medicaments and biological substances status: Secondary | ICD-10-CM

## 2017-12-25 DIAGNOSIS — R45850 Homicidal ideations: Secondary | ICD-10-CM | POA: Diagnosis not present

## 2017-12-25 DIAGNOSIS — F4010 Social phobia, unspecified: Secondary | ICD-10-CM | POA: Diagnosis present

## 2017-12-25 DIAGNOSIS — R21 Rash and other nonspecific skin eruption: Secondary | ICD-10-CM | POA: Diagnosis not present

## 2017-12-25 DIAGNOSIS — F17200 Nicotine dependence, unspecified, uncomplicated: Secondary | ICD-10-CM | POA: Diagnosis present

## 2017-12-25 DIAGNOSIS — L989 Disorder of the skin and subcutaneous tissue, unspecified: Secondary | ICD-10-CM | POA: Diagnosis present

## 2017-12-25 DIAGNOSIS — R7303 Prediabetes: Secondary | ICD-10-CM | POA: Diagnosis present

## 2017-12-25 DIAGNOSIS — Z811 Family history of alcohol abuse and dependence: Secondary | ICD-10-CM | POA: Diagnosis not present

## 2017-12-25 DIAGNOSIS — F17210 Nicotine dependence, cigarettes, uncomplicated: Secondary | ICD-10-CM | POA: Diagnosis not present

## 2017-12-25 DIAGNOSIS — F909 Attention-deficit hyperactivity disorder, unspecified type: Secondary | ICD-10-CM | POA: Diagnosis not present

## 2017-12-25 DIAGNOSIS — K219 Gastro-esophageal reflux disease without esophagitis: Secondary | ICD-10-CM | POA: Diagnosis present

## 2017-12-25 DIAGNOSIS — F315 Bipolar disorder, current episode depressed, severe, with psychotic features: Secondary | ICD-10-CM | POA: Diagnosis not present

## 2017-12-25 DIAGNOSIS — F4310 Post-traumatic stress disorder, unspecified: Secondary | ICD-10-CM | POA: Diagnosis present

## 2017-12-25 DIAGNOSIS — W57XXXA Bitten or stung by nonvenomous insect and other nonvenomous arthropods, initial encounter: Secondary | ICD-10-CM | POA: Diagnosis present

## 2017-12-25 DIAGNOSIS — J449 Chronic obstructive pulmonary disease, unspecified: Secondary | ICD-10-CM | POA: Diagnosis present

## 2017-12-25 DIAGNOSIS — F1220 Cannabis dependence, uncomplicated: Secondary | ICD-10-CM | POA: Diagnosis present

## 2017-12-25 DIAGNOSIS — F3130 Bipolar disorder, current episode depressed, mild or moderate severity, unspecified: Secondary | ICD-10-CM | POA: Diagnosis not present

## 2017-12-25 DIAGNOSIS — F329 Major depressive disorder, single episode, unspecified: Secondary | ICD-10-CM | POA: Diagnosis not present

## 2017-12-25 DIAGNOSIS — R45851 Suicidal ideations: Secondary | ICD-10-CM | POA: Diagnosis present

## 2017-12-25 DIAGNOSIS — Z818 Family history of other mental and behavioral disorders: Secondary | ICD-10-CM

## 2017-12-25 DIAGNOSIS — R569 Unspecified convulsions: Secondary | ICD-10-CM | POA: Diagnosis present

## 2017-12-25 DIAGNOSIS — F419 Anxiety disorder, unspecified: Secondary | ICD-10-CM | POA: Diagnosis not present

## 2017-12-25 DIAGNOSIS — G4700 Insomnia, unspecified: Secondary | ICD-10-CM | POA: Diagnosis present

## 2017-12-25 DIAGNOSIS — Z87820 Personal history of traumatic brain injury: Secondary | ICD-10-CM

## 2017-12-25 DIAGNOSIS — Z79899 Other long term (current) drug therapy: Secondary | ICD-10-CM

## 2017-12-25 DIAGNOSIS — F410 Panic disorder [episodic paroxysmal anxiety] without agoraphobia: Secondary | ICD-10-CM | POA: Diagnosis not present

## 2017-12-25 DIAGNOSIS — R451 Restlessness and agitation: Secondary | ICD-10-CM | POA: Diagnosis not present

## 2017-12-25 MED ORDER — ALUM & MAG HYDROXIDE-SIMETH 200-200-20 MG/5ML PO SUSP: 30.0000 mL | ORAL | Status: DC | PRN

## 2017-12-25 MED ORDER — ATOMOXETINE HCL 60 MG PO CAPS
60.0000 mg | ORAL_CAPSULE | Freq: Every day | ORAL | Status: DC
  Administered 2017-12-25 – 2017-12-27 (×3): 60 mg via ORAL
  Filled 2017-12-25 (×4): qty 1

## 2017-12-25 MED ORDER — QUETIAPINE FUMARATE 25 MG PO TABS
50.0000 mg | ORAL_TABLET | Freq: Every day | ORAL | Status: DC
  Administered 2017-12-26 – 2017-12-28 (×3): 50 mg via ORAL
  Filled 2017-12-25 (×3): qty 2

## 2017-12-25 MED ORDER — LISINOPRIL 20 MG PO TABS
10.0000 mg | ORAL_TABLET | Freq: Every day | ORAL | Status: DC
  Administered 2017-12-26 – 2017-12-28 (×3): 10 mg via ORAL
  Filled 2017-12-25 (×3): qty 1

## 2017-12-25 MED ORDER — LAMOTRIGINE 25 MG PO TABS
150.0000 mg | ORAL_TABLET | Freq: Every day | ORAL | Status: DC
  Administered 2017-12-26 – 2017-12-28 (×3): 150 mg via ORAL
  Filled 2017-12-25 (×3): qty 1

## 2017-12-25 MED ORDER — PANTOPRAZOLE SODIUM 40 MG PO TBEC
80.0000 mg | DELAYED_RELEASE_TABLET | Freq: Every day | ORAL | Status: DC
  Administered 2017-12-26 – 2017-12-28 (×3): 80 mg via ORAL
  Filled 2017-12-25 (×3): qty 2

## 2017-12-25 MED ORDER — ACETAMINOPHEN 325 MG PO TABS
650.0000 mg | ORAL_TABLET | Freq: Four times a day (QID) | ORAL | Status: DC | PRN
  Administered 2017-12-26 – 2017-12-28 (×2): 650 mg via ORAL
  Filled 2017-12-25 (×2): qty 2

## 2017-12-25 MED ORDER — MAGNESIUM HYDROXIDE 400 MG/5ML PO SUSP: 30.0000 mL | Freq: Every day | ORAL | Status: DC | PRN

## 2017-12-25 MED ORDER — QUETIAPINE FUMARATE 200 MG PO TABS
200.0000 mg | ORAL_TABLET | Freq: Every day | ORAL | Status: DC
  Administered 2017-12-25 – 2017-12-27 (×3): 200 mg via ORAL
  Filled 2017-12-25 (×3): qty 1

## 2017-12-25 MED ORDER — TRAZODONE HCL 50 MG PO TABS
150.0000 mg | ORAL_TABLET | Freq: Every evening | ORAL | Status: DC | PRN
  Administered 2017-12-26: 150 mg via ORAL
  Filled 2017-12-25: qty 1

## 2017-12-25 MED ORDER — CITALOPRAM HYDROBROMIDE 20 MG PO TABS
10.0000 mg | ORAL_TABLET | Freq: Every day | ORAL | Status: DC
  Administered 2017-12-26 – 2017-12-28 (×3): 10 mg via ORAL
  Filled 2017-12-25 (×3): qty 1

## 2017-12-25 MED ORDER — LORAZEPAM 1 MG PO TABS
1.0000 mg | ORAL_TABLET | Freq: Once | ORAL | Status: AC
  Administered 2017-12-25: 1 mg via ORAL
  Filled 2017-12-25: qty 1

## 2017-12-25 MED ORDER — TRAZODONE HCL 100 MG PO TABS: 100.0000 mg | ORAL_TABLET | Freq: Every evening | ORAL | Status: DC | PRN

## 2017-12-25 MED ORDER — HYDROXYZINE HCL 50 MG PO TABS: 50.0000 mg | ORAL_TABLET | Freq: Three times a day (TID) | ORAL | Status: DC | PRN

## 2017-12-25 MED ORDER — DOXYCYCLINE HYCLATE 100 MG PO TABS
100.0000 mg | ORAL_TABLET | Freq: Two times a day (BID) | ORAL | Status: DC
  Administered 2017-12-25 – 2017-12-28 (×6): 100 mg via ORAL
  Filled 2017-12-25 (×7): qty 1

## 2017-12-25 MED ORDER — DOXYCYCLINE HYCLATE 100 MG PO TABS
100.0000 mg | ORAL_TABLET | Freq: Two times a day (BID) | ORAL | Status: DC
  Administered 2017-12-25: 100 mg via ORAL
  Filled 2017-12-25 (×2): qty 1

## 2017-12-25 NOTE — Tx Team (Signed)
Initial Treatment Plan 12/25/2017 5:14 PM [name] LGS:[id number]    PATIENT STRESSORS: Marital or family conflict Medication change or noncompliance Substance abuse   PATIENT STRENGTHS: Capable of independent living Armed forces logistics/support/administrative officer Motivation for treatment/growth   PATIENT IDENTIFIED PROBLEMS: Bipolar Depression  Anger  Substance abuse                 DISCHARGE CRITERIA:  Improved stabilization in mood, thinking, and/or behavior  PRELIMINARY DISCHARGE PLAN: Outpatient therapy  PATIENT/FAMILY INVOLVEMENT: This treatment plan has been presented to and reviewed with the patient, [name], and/or family member, .  The patient and family have been given the opportunity to ask questions and make suggestions.  Floyde Parkins, RN 12/25/2017, 5:14 PM

## 2017-12-25 NOTE — ED Provider Notes (Addendum)
-----------------------------------------   9:18 AM on 12/25/2017 -----------------------------------------  I have seen the patient in the [facility] for complaint of rash to the back of his head and buttocks.  Patient states he has had this rash intermittently for the past year.  States the only time he is gotten rid of it is after taking doxycycline.  Several of the lesions do appear somewhat superinfected likely from excoriation.  We will place the patient on a 7-day course of doxycycline twice daily.   Harvest Dark, MD 12/25/17 [phone]   Psychiatry will be admitting to their service once a bed becomes available.    Harvest Dark, MD 12/25/17 1512

## 2017-12-25 NOTE — ED Notes (Signed)
Patient with discharge/readmit orders to go to [facility], gave report to Sheppard Plumber, Patient voices understanding and all belongings taken with patient to unit, patient transferred via w/c with police custody.

## 2017-12-25 NOTE — ED Notes (Signed)
Patient is up < went to bathroom, no signs of distress, nurse gave him breakfast tray, He is polite and cooperative, states that He slept without difficulty, will continue to monitor, q 15 minute checks and camera surveillance for safety.

## 2017-12-25 NOTE — ED Notes (Signed)
Patient ate 100% of breakfast tray

## 2017-12-25 NOTE — Consult Note (Signed)
Psychiatry: Chart reviewed.  Case reviewed with TTS.  TTS recommends inpatient hospitalization.  Orders completed for admission.
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
## 2017-12-25 NOTE — ED Notes (Signed)
Patient ate 100% of lunch and had beverage.  

## 2017-12-25 NOTE — ED Provider Notes (Signed)
-----------------------------------------   5:54 AM on 12/25/2017 -----------------------------------------   Blood pressure (!) 151/84, pulse 91, temperature 98 F (36.7 C), temperature source Oral, resp. rate 16, height 6' (1.829 m), weight 90.7 kg, SpO2 100 %.  The patient had no acute events since last update.  Calm and cooperative at this time.  Disposition is pending Psychiatry/Behavioral Medicine team recommendations.     Darel Hong, MD 12/25/17 [phone]

## 2017-12-25 NOTE — ED Notes (Addendum)
Nurse received and administered ativan order due to patient states that he feels anxiety, patient states that He gets mad at people often and people gets on His nerves and that he has already done time in jail and does not want to do something to have to go back to jail, nurse listened and offered antianxiety. Patient states that he does have hi at times without a plan, /no si or avh, also states that he feels strange, nurse will continue to monitor , q 15 minute checks and camera surveillance in progress for safety.

## 2017-12-25 NOTE — ED Notes (Signed)
Patient's mom just picked came by and picked up Patient's wallet , Patient agreed to her taking it home with her to keep it safe before He is transferred to another hospital or admitted here. (black wallet with chain).

## 2017-12-25 NOTE — Plan of Care (Signed)
Patient was in his room when I got on the unit. Patient was asking about his nightly medications. Patient was informed about the medications he was taking tonight (10/14). Patient was informed about attending group tomorrow and the benefits of it.    Problem: Education: Goal: Knowledge of [facility] Education information/materials will improve Outcome: Progressing Goal: Emotional status will improve Outcome: Progressing Goal: Mental status will improve Outcome: Progressing   Problem: Health Behavior/Discharge Planning: Goal: Compliance with treatment plan for underlying cause of condition will improve Outcome: Progressing   Problem: Physical Regulation: Goal: Ability to maintain clinical measurements within normal limits will improve Outcome: Progressing   Problem: Safety: Goal: Periods of time without injury will increase Outcome: Progressing   Problem: Activity: Goal: Will identify at least one activity in which they can participate Outcome: Progressing   Problem: Coping: Goal: Ability to identify and develop effective coping behavior will improve Outcome: Progressing Goal: Ability to interact with others will improve Outcome: Progressing Goal: Demonstration of participation in decision-making regarding own care will improve Outcome: Progressing Goal: Ability to use eye contact when communicating with others will improve Outcome: Progressing   Problem: Safety: Goal: Ability to remain free from injury will improve Outcome: Progressing

## 2017-12-25 NOTE — BH Assessment (Signed)
Patient is to be admitted to [facility] by Dr. Weber Cooks.  Attending Physician will be Dr. Bary Leriche.   Patient has been assigned to room 322, by [place]   Intake Paper Work has been signed and placed on patient chart.  ER staff is aware of the admission:  [facility] ER Secretary    Dr. Kerman Passey, ER MD   Abigail Butts Patient's Nurse   Elberta Fortis Patient Access.

## 2017-12-26 DIAGNOSIS — F315 Bipolar disorder, current episode depressed, severe, with psychotic features: Secondary | ICD-10-CM

## 2017-12-26 LAB — TSH: TSH: 1.423 u[IU]/mL (ref 0.350–4.500)

## 2017-12-26 LAB — LIPID PANEL
CHOLESTEROL: 185 mg/dL (ref 0–200)
HDL: 43 mg/dL (ref >40)
LDL Cholesterol: 112 mg/dL — ABNORMAL HIGH (ref 0–99)
Total CHOL/HDL Ratio: 4.3 RATIO
Triglycerides: 151 mg/dL — ABNORMAL HIGH (ref <150)
VLDL: 30 mg/dL (ref 0–40)

## 2017-12-26 LAB — HEMOGLOBIN A1C
Hgb A1c MFr Bld: 5.9 % — ABNORMAL HIGH (ref 4.8–5.6)
MEAN PLASMA GLUCOSE: 122.63 mg/dL

## 2017-12-26 MED ORDER — FLUVOXAMINE MALEATE 50 MG PO TABS
50.0000 mg | ORAL_TABLET | Freq: Every day | ORAL | Status: DC
  Administered 2017-12-26: 50 mg via ORAL
  Filled 2017-12-26: qty 1

## 2017-12-26 NOTE — Plan of Care (Signed)
Patient verbalizes understanding of the general information that's been provided to him and he has not voiced any further questions or concerns at this time. Patient denies SI/HI/AVH as well as depression and anxiety stating "not today, but you never know". Patient has been in compliance with his prescribed therapeutic regimen without any issues at this time. Patient has maintained his clinical measurements within normal limits thus far. Patient has been free from injury thus far on the unit and has interacted well with staff and other members on the unit. Patient has used fair eye contact with this Probation officer and remains safe on the unit at this time.  Problem: Education: Goal: Knowledge of [facility] Education information/materials will improve Outcome: Progressing Goal: Emotional status will improve Outcome: Progressing Goal: Mental status will improve Outcome: Progressing   Problem: Health Behavior/Discharge Planning: Goal: Compliance with treatment plan for underlying cause of condition will improve Outcome: Progressing   Problem: Physical Regulation: Goal: Ability to maintain clinical measurements within normal limits will improve Outcome: Progressing   Problem: Safety: Goal: Periods of time without injury will increase Outcome: Progressing   Problem: Activity: Goal: Will identify at least one activity in which they can participate Outcome: Progressing   Problem: Coping: Goal: Ability to identify and develop effective coping behavior will improve Outcome: Progressing Goal: Ability to interact with others will improve Outcome: Progressing Goal: Demonstration of participation in decision-making regarding own care will improve Outcome: Progressing Goal: Ability to use eye contact when communicating with others will improve Outcome: Progressing   Problem: Safety: Goal: Ability to remain free from injury will improve Outcome: Progressing
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
## 2017-12-26 NOTE — Progress Notes (Signed)
[facility] MD Progress Note  12/27/2017 12:45 PM [name]  MRN:  [id number]  Subjective:    [name] met with treatment team today. He feels better but still sounds confused. Complains of severe anxiety and spend all day yesterday in his room. Today his goal is to participate. He seems to be out in the community more. He tolerates medications well. Sleep and appetite are adequate.   He complains of painful skin lesions on the back of his head and in the sacral area for the past several months. It was initially thought to be related to tick bite and treated with Vibra with initial improvement. He was started on Vibra in the ER again. Tried to call [facility] for consultation, [phone] but no response. The patient is on Lamictal for seizure/bipolar. There are no blisters or signs of infection.  Principal Problem: Bipolar I disorder, current or most recent episode depressed, with psychotic features [facility]) Diagnosis:   Patient Active Problem List   Diagnosis Date Noted  . Bipolar I disorder, current or most recent episode depressed, with psychotic features ([place]) [F31.5] 12/25/2017    Priority: High  . Suicidal ideation [R45.851] 12/25/2017  . Cannabis use disorder, moderate, dependence ([place]) [F12.20] 12/25/2017  . Borderline diabetes mellitus [R73.03] 08/27/2015  . Combined fat and carbohydrate induced hyperlipemia [E78.2] 08/27/2015  . Anxiety [F41.9] 05/21/2015  . Moderate episode of recurrent major depressive disorder ([place]) [F33.1] 05/21/2015  . Seizure ([place]) [R56.9] 05/21/2015  . Cognitive decline [R41.89] 04/30/2015  . Abdominal distension [R14.0] 02/24/2015  . Hyponatremia [E87.1] 02/24/2015  . HTN (hypertension) [I10] 02/24/2015  . Hx of traumatic brain injury [Z87.820] 02/24/2015  . HTN (hypertension), benign [I10] 02/24/2015  . Abdominal distension (gaseous) [R14.0] 02/24/2015  . Essential (primary) hypertension [I10] 02/24/2015  . Hypo-osmolality and  hyponatremia [E87.1] 02/24/2015  . Personal history of traumatic brain injury [Z87.820] 02/24/2015  . Insomnia, persistent [G47.00] 01/09/2015   Total Time spent with patient: 20 minutes  Past Psychiatric History: bipolar disorder  Past Medical History:  Past Medical History:  Diagnosis Date  . Anxiety   . Chronic pain   . Head injury   . Hypertension   . Insomnia   . Poor historian   . Reflux   . Seizures ([place])     Past Surgical History:  Procedure Laterality Date  . ABDOMINAL SURGERY    . BRAIN SURGERY     Family History:  Family History  Problem Relation Age of Onset  . Cancer Mother   . Cancer Father   . Alcohol abuse Father   . Schizophrenia Brother    Family Psychiatric  History: schizophrenia Social History:  Social History   Substance and Sexual Activity  Alcohol Use Yes  . Alcohol/week: 2.0 standard drinks  . Types: 2 Shots of liquor per week   Comment: a couple 6 packs beer daily     Social History   Substance and Sexual Activity  Drug Use Yes  . Types: Marijuana   Comment: occ    Social History   Socioeconomic History  . Marital status: Single    Spouse name: Not on file  . Number of children: Not on file  . Years of education: Not on file  . Highest education level: Not on file  Occupational History  . Not on file  Social Needs  . Financial resource strain: Not very hard  . Food insecurity:    Worry: Not on file    Inability: Not  on file  . Transportation needs:    Medical: No    Non-medical: No  Tobacco Use  . Smoking status: Current Every Day Smoker    Packs/day: 1.00    Types: Cigarettes    Start date: 05/21/1990  . Smokeless tobacco: Former Systems developer    Quit date: 05/20/1985  . Tobacco comment: refused  Substance and Sexual Activity  . Alcohol use: Yes    Alcohol/week: 2.0 standard drinks    Types: 2 Shots of liquor per week    Comment: a couple 6 packs beer daily  . Drug use: Yes    Types: Marijuana    Comment: occ  . Sexual  activity: Not Currently  Lifestyle  . Physical activity:    Days per week: 0 days    Minutes per session: Not on file  . Stress: Rather much  Relationships  . Social connections:    Talks on phone: Not on file    Gets together: Twice a week    Attends religious service: Not on file    Active member of club or organization: Not on file    Attends meetings of clubs or organizations: Not on file    Relationship status: Not on file  Other Topics Concern  . Not on file  Social History Narrative   ** Merged History Encounter **       Additional Social History:                         Sleep: Fair  Appetite:  Fair  Current Medications: Current Facility-Administered Medications  Medication Dose Route Frequency Provider Last Rate Last Dose  . acetaminophen (TYLENOL) tablet 650 mg  650 mg Oral Q6H PRN Clapacs, Madie Reno, MD   650 mg at 12/26/17 0807  . alum & mag hydroxide-simeth (MAALOX/MYLANTA) 200-200-20 MG/5ML suspension 30 mL  30 mL Oral Q4H PRN Clapacs, John T, MD      . atomoxetine (STRATTERA) capsule 60 mg  60 mg Oral QHS Clapacs, Madie Reno, MD   60 mg at 12/26/17 2100  . citalopram (CELEXA) tablet 10 mg  10 mg Oral Daily Clapacs, Madie Reno, MD   10 mg at 12/27/17 0806  . doxycycline (VIBRA-TABS) tablet 100 mg  100 mg Oral Q12H Clapacs, Madie Reno, MD   100 mg at 12/27/17 0805  . fluvoxaMINE (LUVOX) tablet 100 mg  100 mg Oral QHS ,  B, MD      . hydrOXYzine (ATARAX/VISTARIL) tablet 50 mg  50 mg Oral TID PRN Clapacs, John T, MD      . lamoTRIgine (LAMICTAL) tablet 150 mg  150 mg Oral Daily Clapacs, Madie Reno, MD   150 mg at 12/27/17 0805  . lisinopril (PRINIVIL,ZESTRIL) tablet 10 mg  10 mg Oral Daily Clapacs, Madie Reno, MD   10 mg at 12/27/17 0806  . magnesium hydroxide (MILK OF MAGNESIA) suspension 30 mL  30 mL Oral Daily PRN Clapacs, John T, MD      . pantoprazole (PROTONIX) EC tablet 80 mg  80 mg Oral Daily Clapacs, Madie Reno, MD   80 mg at 12/27/17 0806  . QUEtiapine  (SEROQUEL) tablet 200 mg  200 mg Oral QHS Clapacs, John T, MD   200 mg at 12/26/17 2100  . QUEtiapine (SEROQUEL) tablet 50 mg  50 mg Oral Daily Clapacs, Madie Reno, MD   50 mg at 12/27/17 0805  . traZODone (DESYREL) tablet 150 mg  150 mg Oral QHS PRN  Clapacs, Madie Reno, MD   150 mg at 12/26/17 2101    Lab Results:  Results for orders placed or performed during the hospital encounter of 12/25/17 (from the past 48 hour(s))  Hemoglobin A1c     Status: Abnormal   Collection Time: 12/26/17  6:58 AM  Result Value Ref Range   Hgb A1c MFr Bld 5.9 (H) 4.8 - 5.6 %    Comment: (NOTE) Pre diabetes:          5.7%-6.4% Diabetes:              >6.4% Glycemic control for   <7.0% adults with diabetes    Mean Plasma Glucose 122.63 mg/dL    Comment: Performed at [facility], [place] [address], [place], [place] [address]  Lipid panel     Status: Abnormal   Collection Time: 12/26/17  6:58 AM  Result Value Ref Range   Cholesterol 185 0 - 200 mg/dL   Triglycerides 151 (H) <150 mg/dL   HDL 43 >40 mg/dL   Total CHOL/HDL Ratio 4.3 RATIO   VLDL 30 0 - 40 mg/dL   LDL Cholesterol 112 (H) 0 - 99 mg/dL    Comment:        Total Cholesterol/HDL:CHD Risk Coronary Heart Disease Risk Table                     Men   Women  1/2 Average Risk   3.4   3.3  Average Risk       5.0   4.4  2 X Average Risk   9.6   7.1  3 X Average Risk  23.4   11.0        Use the calculated Patient Ratio above and the CHD Risk Table to determine the patient's CHD Risk.        ATP III CLASSIFICATION (LDL):  <100     mg/dL   Optimal  100-129  mg/dL   Near or Above                    Optimal  130-159  mg/dL   Borderline  160-189  mg/dL   High  >190     mg/dL   Very High Performed at [facility], [address] [place], [place] [address]   TSH     Status: None   Collection Time: 12/26/17  6:58 AM  Result Value Ref Range   TSH 1.423 0.350 - 4.500 uIU/mL    Comment: Performed by a 3rd Generation assay with a functional  sensitivity of <=0.01 uIU/mL. Performed at [facility], [address] [place], [place] [address]     Blood Alcohol level:  Lab Results  Component Value Date   [facility] <10 12/24/2017   ETH <11 55/37/4827    Metabolic Disorder Labs: Lab Results  Component Value Date   HGBA1C 5.9 (H) 12/26/2017   MPG 122.63 12/26/2017   MPG 126 (H) 03/25/2010   No results found for: PROLACTIN Lab Results  Component Value Date   CHOL 185 12/26/2017   TRIG 151 (H) 12/26/2017   HDL 43 12/26/2017   CHOLHDL 4.3 12/26/2017   VLDL 30 12/26/2017   LDLCALC 112 (H) 12/26/2017   LDLCALC 135 (H) 08/23/2010    Physical Findings: AIMS: Facial and Oral Movements Muscles of Facial Expression: None, normal Lips and Perioral Area: None, normal Jaw: None, normal Tongue: None, normal,Extremity Movements Upper (arms, wrists, hands, fingers): None, normal  Lower (legs, knees, ankles, toes): None, normal, Trunk Movements Neck, shoulders, hips: None, normal, Overall Severity Severity of abnormal movements (highest score from questions above): None, normal Incapacitation due to abnormal movements: None, normal Patient's awareness of abnormal movements (rate only patient's report): No Awareness, Dental Status Current problems with teeth and/or dentures?: No Does patient usually wear dentures?: No  CIWA:  CIWA-Ar Total: 2 COWS:  COWS Total Score: 2  Musculoskeletal: Strength & Muscle Tone: within normal limits Gait & Station: normal Patient leans: N/A  Psychiatric Specialty Exam: Physical Exam  Nursing note and vitals reviewed. Psychiatric: His speech is normal. His mood appears anxious. He is withdrawn and actively hallucinating. Thought content is paranoid. Cognition and memory are impaired. He expresses impulsivity.    Review of Systems  Neurological: Negative.   Psychiatric/Behavioral: Positive for hallucinations. The patient is nervous/anxious.   All other systems reviewed and are  negative.   Blood pressure 123/86, pulse 88, temperature 97.8 F (36.6 C), temperature source Oral, resp. rate 17, height 6' 1" (1.854 m), weight 90.7 kg, SpO2 97 %.Body mass index is 26.39 kg/m.  General Appearance: Fairly Groomed  Eye Contact:  Good  Speech:  Clear and Coherent  Volume:  Normal  Mood:  Dysphoric  Affect:  Congruent  Thought Process:  Goal Directed and Descriptions of Associations: Intact  Orientation:  Full (Time, Place, and Person)  Thought Content:  Hallucinations: Auditory and Paranoid Ideation  Suicidal Thoughts:  No  Homicidal Thoughts:  No  Memory:  Immediate;   Fair Recent;   Fair Remote;   Fair  Judgement:  Impaired  Insight:  Shallow  Psychomotor Activity:  Decreased  Concentration:  Concentration: Fair and Attention Span: Fair  Recall:  [employer] of Knowledge:  Fair  Language:  Fair  Akathisia:  No  Handed:  Right  AIMS (if indicated):     Assets:  Communication Skills Desire for Improvement Financial Resources/Insurance Housing Resilience Social Support  ADL's:  Intact  Cognition:  WNL  Sleep:  Number of Hours: 4.45     Treatment Plan Summary: Daily contact with patient to assess and evaluate symptoms and progress in treatment and Medication management   [name] is a 47 year old male with a hiastory of TBI and bipolar disorder admitted for auditory hallucinations and suicidal/homicidal ideation.  #Suicidal ideation -patient able to contract for safety in the hospital  #Mood/psychosis -continue Seroquel 50 mg in am, 200 mg nightly -stop Celexa 10 mg daily -increase Luvox to 100 mg nightly  #Seizures -Lamictal 150 mg daily  #ADHD -Strattera 60 mg nightly  #HTN -Lisinopril 10 mg daily  #GERD -Protonix 40 mg daily  #Tick bite/skin lesions -Vibramycin 100 mg BID for 13 doses -will attampt Dermatology consult  #Cannabis use -patient minimizes problems and declines treatment  #Disposition -discharge to home  with the mother -follow up with Dr. Eleonore Chiquito, MD 12/27/2017, 12:45 PM

## 2017-12-26 NOTE — [facility] Suicide Risk Assessment (Signed)
[facility] Admission Suicide Risk Assessment   Nursing information obtained from:  Patient Demographic factors:  Male, Caucasian, Unemployed Current Mental Status:  Thoughts of violence towards others Loss Factors:  Decline in physical health Historical Factors:  NA Risk Reduction Factors:  Positive coping skills or problem solving skills, Sense of responsibility to family  Total Time spent with patient: 1 hour Principal Problem: Bipolar I disorder, current or most recent episode depressed, with psychotic features ([place]) Diagnosis:   Patient Active Problem List   Diagnosis Date Noted  . Bipolar I disorder, current or most recent episode depressed, with psychotic features ([place]) [F31.5] 12/25/2017    Priority: High  . Suicidal ideation [R45.851] 12/25/2017  . Cannabis use disorder, moderate, dependence ([place]) [F12.20] 12/25/2017  . Borderline diabetes mellitus [R73.03] 08/27/2015  . Combined fat and carbohydrate induced hyperlipemia [E78.2] 08/27/2015  . Anxiety [F41.9] 05/21/2015  . Moderate episode of recurrent major depressive disorder ([place]) [F33.1] 05/21/2015  . Seizure ([place]) [R56.9] 05/21/2015  . Cognitive decline [R41.89] 04/30/2015  . Abdominal distension [R14.0] 02/24/2015  . Hyponatremia [E87.1] 02/24/2015  . HTN (hypertension) [I10] 02/24/2015  . Hx of traumatic brain injury [Z87.820] 02/24/2015  . HTN (hypertension), benign [I10] 02/24/2015  . Abdominal distension (gaseous) [R14.0] 02/24/2015  . Essential (primary) hypertension [I10] 02/24/2015  . Hypo-osmolality and hyponatremia [E87.1] 02/24/2015  . Personal history of traumatic brain injury [Z87.820] 02/24/2015  . Insomnia, persistent [G47.00] 01/09/2015   Subjective Data: suicidal ideation, hallucinations  Continued Clinical Symptoms:  Alcohol Use Disorder Identification Test Final Score (AUDIT): 3 The "Alcohol Use Disorders Identification Test", Guidelines for Use in Primary Care, Second Edition.  World Social worker [facility]). Score between 0-7:  no or low risk or alcohol related problems. Score between 8-15:  moderate risk of alcohol related problems. Score between 16-19:  high risk of alcohol related problems. Score 20 or above:  warrants further diagnostic evaluation for alcohol dependence and treatment.   CLINICAL FACTORS:   Bipolar Disorder:   Mixed State Previous Psychiatric Diagnoses and Treatments Medical Diagnoses and Treatments/Surgeries   Musculoskeletal: Strength & Muscle Tone: within normal limits Gait & Station: normal Patient leans: N/A  Psychiatric Specialty Exam: Physical Exam  Nursing note and vitals reviewed. Psychiatric: His speech is normal. His mood appears anxious. He is slowed, withdrawn and actively hallucinating. Thought content is paranoid. Cognition and memory are normal. He expresses impulsivity. He exhibits a depressed mood.    Review of Systems  Neurological: Negative.   Psychiatric/Behavioral: Positive for hallucinations and suicidal ideas. The patient is nervous/anxious and has insomnia.   All other systems reviewed and are negative.   Blood pressure 127/88, pulse 85, temperature 98.2 F (36.8 C), temperature source Oral, resp. rate 17, height 6\' 1"  (1.854 m), weight 90.7 kg, SpO2 98 %.Body mass index is 26.39 kg/m.  General Appearance: Casual  Eye Contact:  Good  Speech:  Clear and Coherent  Volume:  Normal  Mood:  Anxious and Depressed  Affect:  Congruent  Thought Process:  Goal Directed and Descriptions of Associations: Intact  Orientation:  Full (Time, Place, and Person)  Thought Content:  WDL  Suicidal Thoughts:  Yes.  without intent/plan  Homicidal Thoughts:  No  Memory:  Immediate;   Fair Recent;   Fair Remote;   Fair  Judgement:  Impaired  Insight:  Shallow  Psychomotor Activity:  Decreased  Concentration:  Concentration: Fair and Attention Span: Fair  Recall:  [employer] of Knowledge:  Fair  Language:  Fair  Akathisia:  No   Handed:  Right  AIMS (if indicated):     Assets:  Communication Skills Desire for Improvement Financial Resources/Insurance Housing Physical Health Resilience Social Support Transportation  ADL's:  Intact  Cognition:  WNL  Sleep:  Number of Hours: 6      COGNITIVE FEATURES THAT CONTRIBUTE TO RISK:  None    SUICIDE RISK:   Moderate:  Frequent suicidal ideation with limited intensity, and duration, some specificity in terms of plans, no associated intent, good self-control, limited dysphoria/symptomatology, some risk factors present, and identifiable protective factors, including available and accessible social support.  PLAN OF CARE: hospital admission, medication management, discharge planning.  [name] is a 47 year old male with a hiastory of TBI and bipolar disorder admitted for auditory hallucinations and suicidal/homicidal ideation.  #Suicidal ideation -patient able to contract for safety in the hospital  #Mood/psychosis -continue Seroquel 50 mg in am, 200 mg nightly -stop Celexa 10 mg daily -start Luvox 50 mg nightly  #Seizures -Lamictal 150 mg daily  #ADHD -Strattera 60 mg nightly  #HTN -Lisinopril 10 mg daily  #GERD -Protonix 40 mg daily  # -Vibramycin 100 mg BID for 13 doses  #Cannabis use -patient minimizes problems and declines treatment  #Disposition -discharge to home with the mother -follow up with Dr. Gretel Acre     I certify that inpatient services furnished can reasonably be expected to improve the patient's condition.   Orson Slick, MD 12/26/2017, 11:34 AM

## 2017-12-26 NOTE — Progress Notes (Signed)
Recreation Therapy Notes  Date: 12/26/2017  Time: 9:30 am   Location: Craft room   Behavioral response: N/A   Intervention Topic: Life planning  Discussion/Intervention: Patient did not attend group.   Clinical Observations/Feedback:  Patient did not attend group.   Jadiel Schmieder LRT/CTRS        Tania Steinhauser 12/26/2017 10:47 AM

## 2017-12-26 NOTE — Progress Notes (Addendum)
D - Patient was in his room when I went to introduce myself. Patient was asking about his medications he would be taking tonight. I informed the patient about his medications and made sure he was oriented to the unit. Patient was shown where to get water in the day room. Patient denied SI, HI, AVH. Patient stated he wasn't depressed but has anxiety. When I asked what level his anxiety was on a scale of 0 - 10 he stated "im not really sure".   A - Patient was compliant with medications. Patient was pleasant and talked about wanting to get better. Stating that he needs his "meds tweaked" Patient state he had a rash on his buttocks and the back of his head. Patient is on antibiotics for it and was given these antibiotics per MD order.   R - Patient contracted for safety and denied AVH, Si, HI at this time. Patient will be monitored Q 15 min per unit protocol. Patient was informed to let staff know if he has any problems.
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
## 2017-12-26 NOTE — Progress Notes (Signed)
D- Patient alert and oriented. Patient presents in a pleasant mood on assessment stating that he slept fair last night. Patient rates his pain a "6/10" in his neck and back because of "my sores", patient requested pain medication from this writer. Patient endorses depression, however, states to this writer "I don't feel like talking to anybody ma'am". Patient states that his ears ring all the time. Patient denies SI, HI, VH, at this time stating to this writer "not today, but you never know". Patient had no stated goals for today.  A- Scheduled medications administered to patient, per MD orders. Support and encouragement provided.  Routine safety checks conducted every 15 minutes.  Patient informed to notify staff with problems or concerns.  R- No adverse drug reactions noted. Patient contracts for safety at this time. Patient compliant with medications and treatment plan. Patient receptive, calm, and cooperative. Patient interacts well with others on the unit.  Patient remains safe at this time.

## 2017-12-27 MED ORDER — FLUVOXAMINE MALEATE 50 MG PO TABS
100.0000 mg | ORAL_TABLET | Freq: Every day | ORAL | Status: DC
  Administered 2017-12-27: 100 mg via ORAL
  Filled 2017-12-27: qty 2

## 2017-12-27 NOTE — BHH Group Notes (Signed)
  Emotional Regulation December 27, 2017  1PM  Type of Therapy/Topic:  Group Therapy:  Emotion Regulation  Participation Level:  Did Not Attend   Description of Group:   The purpose of this group is to assist patients in learning to regulate negative emotions and experience positive emotions. Patients will be guided to discuss ways in which they have been vulnerable to their negative emotions. These vulnerabilities will be juxtaposed with experiences of positive emotions or situations, and patients will be challenged to use positive emotions to combat negative ones. Special emphasis will be placed on coping with negative emotions in conflict situations, and patients will process healthy conflict resolution skills.  Therapeutic Goals: 1. Patient will identify two positive emotions or experiences to reflect on in order to balance out negative emotions 2. Patient will label two or more emotions that they find the most difficult to experience 3. Patient will demonstrate positive conflict resolution skills through discussion and/or role plays  Summary of Patient Progress:       Therapeutic Modalities:   Cognitive Behavioral Therapy Feelings Identification Dialectical Behavioral Therapy  [place] Reise Gladney LCSW [phone]

## 2017-12-27 NOTE — Plan of Care (Signed)
Pt. Is complaint with medications this evening. Pt. Interactions with peers and staff appropriate. Pt. Denies SI/HI, verbally can contract for safety. Pt. Given education, but needs reinforcement.    Problem: Education: Goal: Knowledge of [place] [facility] Education information/materials will improve Outcome: Not Progressing   Problem: Health Behavior/Discharge Planning: Goal: Compliance with treatment plan for underlying cause of condition will improve Outcome: Progressing   Problem: Coping: Goal: Ability to interact with others will improve Outcome: Progressing   Problem: Safety: Goal: Ability to remain free from injury will improve Outcome: Progressing

## 2017-12-27 NOTE — Progress Notes (Signed)
Recreation Therapy Notes  INPATIENT RECREATION THERAPY ASSESSMENT  Patient Details Name: [name] MRN: [id number] DOB: February 12, 1971 Today's Date: 12/27/2017       Information Obtained From: Patient  Able to Participate in Assessment/Interview: Yes  Patient Presentation: Responsive  Reason for Admission (Per Patient): Other (Comments), Med Non-Compliance  Patient Stressors:    Coping Skills:   Exercise  Leisure Interests (2+):  Nature - Flower gardening  Frequency of Recreation/Participation: Financial risk analyst Resources:  No  Community Resources:     Current Use: No  If no, Barriers?:    Expressed Interest in [place]:    [employer] of Residence:  Conservator, museum/gallery  Patient Main Form of Transportation: Other (Comment)(My mother)  Patient Strengths:  I get along with other  Patient Identified Areas of Improvement:  Be laid back  Patient Goal for Hospitalization:  Get my health  checked out  Current SI (including self-harm):  No  Current HI:  No  Current AVH: No  Staff Intervention Plan: Group Attendance, Collaborate with Interdisciplinary Treatment Team  Consent to Intern Participation: N/A  [name] 12/27/2017, 4:01 PM

## 2017-12-27 NOTE — BHH Group Notes (Signed)
[place] Group Notes:  (Nursing/MHT/Case Management/Adjunct)  Date:  12/27/2017  Time:  2:53 PM  Type of Therapy:  Psychoeducational Skills  Participation Level:  Did Not Attend   [name] [facility] 12/27/2017, 2:53 PM

## 2017-12-27 NOTE — Progress Notes (Signed)
Recreation Therapy Notes  Date: 12/27/2017  Time: 9:30 am   Location: Craft room   Behavioral response: N/A   Intervention Topic: Goals  Discussion/Intervention: Patient did not attend group.   Clinical Observations/Feedback:  Patient did not attend group.   Hanni Milford LRT/CTRS        Izel Hochberg 12/27/2017 11:28 AM

## 2017-12-27 NOTE — Progress Notes (Signed)
D: Pt denies SI/HI/AVH, contracts for safety. Pt. Reports this evening during medication pass that he is anxious, "7/10" and denies depression. Pt. Denies PRN medications for anxiety. Pt. When discussing his goals for treatment reports, "I want to continue to work on my aggravations", but does not elaborate on things that cause him aggravations. Pt. Attends snacks, then goes to bed. No behavioral issues to report. No observations of responding to internal stimuli.      A: Q x 15 minute observation checks were completed for safety. Patient was provided with education, but needs reinforcement.  Patient was given/offered medications per orders. Patient  was encourage to attend groups, participate in unit activities and continue with plan of care. Pt. Chart and plans of care reviewed. Pt. Given support and encouragement.   R: Patient is complaint with medication and unit procedures with direction and encouragement from staff.            Precautionary checks every 15 minutes for safety maintained, room free of safety hazards, patient sustains no injury or falls during this shift. Will endorse care to next shift.

## 2017-12-27 NOTE — Tx Team (Signed)
Interdisciplinary Treatment and Diagnostic Plan Update  12/27/2017 Time of Session: 10:30am [name] MRN: [id number]  Principal Diagnosis: Bipolar I disorder, current or most recent episode depressed, with psychotic features ([place])  Secondary Diagnoses: Principal Problem:   Bipolar I disorder, current or most recent episode depressed, with psychotic features ([place]) Active Problems:   HTN (hypertension)   Essential (primary) hypertension   Personal history of traumatic brain injury   Seizure ([place])   Suicidal ideation   Cannabis use disorder, moderate, dependence ([place])   Current Medications:  Current Facility-Administered Medications  Medication Dose Route Frequency Provider Last Rate Last Dose  . acetaminophen (TYLENOL) tablet 650 mg  650 mg Oral Q6H PRN Clapacs, Madie Reno, MD   650 mg at 12/26/17 0807  . alum & mag hydroxide-simeth (MAALOX/MYLANTA) 200-200-20 MG/5ML suspension 30 mL  30 mL Oral Q4H PRN Clapacs, John T, MD      . atomoxetine (STRATTERA) capsule 60 mg  60 mg Oral QHS Clapacs, Madie Reno, MD   60 mg at 12/26/17 2100  . citalopram (CELEXA) tablet 10 mg  10 mg Oral Daily Clapacs, Madie Reno, MD   10 mg at 12/27/17 0806  . doxycycline (VIBRA-TABS) tablet 100 mg  100 mg Oral Q12H Clapacs, Madie Reno, MD   100 mg at 12/27/17 0805  . fluvoxaMINE (LUVOX) tablet 100 mg  100 mg Oral QHS Pucilowska, Jolanta B, MD      . hydrOXYzine (ATARAX/VISTARIL) tablet 50 mg  50 mg Oral TID PRN Clapacs, John T, MD      . lamoTRIgine (LAMICTAL) tablet 150 mg  150 mg Oral Daily Clapacs, Madie Reno, MD   150 mg at 12/27/17 0805  . lisinopril (PRINIVIL,ZESTRIL) tablet 10 mg  10 mg Oral Daily Clapacs, Madie Reno, MD   10 mg at 12/27/17 0806  . magnesium hydroxide (MILK OF MAGNESIA) suspension 30 mL  30 mL Oral Daily PRN Clapacs, John T, MD      . pantoprazole (PROTONIX) EC tablet 80 mg  80 mg Oral Daily Clapacs, Madie Reno, MD   80 mg at 12/27/17 0806  . QUEtiapine (SEROQUEL) tablet 200 mg  200 mg Oral QHS Clapacs,  John T, MD   200 mg at 12/26/17 2100  . QUEtiapine (SEROQUEL) tablet 50 mg  50 mg Oral Daily Clapacs, Madie Reno, MD   50 mg at 12/27/17 0805  . traZODone (DESYREL) tablet 150 mg  150 mg Oral QHS PRN Clapacs, Madie Reno, MD   150 mg at 12/26/17 2101   PTA Medications: Medications Prior to Admission  Medication Sig Dispense Refill Last Dose  . albuterol (PROVENTIL HFA;VENTOLIN HFA) 108 (90 Base) MCG/ACT inhaler Inhale 2 puffs into the lungs every 6 (six) hours as needed for wheezing or shortness of breath. 1 Inhaler 2 Past Month at Unknown time  . atomoxetine (STRATTERA) 60 MG capsule Take 1 capsule (60 mg total) by mouth daily. (Patient taking differently: Take 60 mg by mouth at bedtime. ) 90 capsule 2 12/23/2017 at Unknown time  . citalopram (CELEXA) 10 MG tablet Take 1 tablet (10 mg total) by mouth daily. 90 tablet 1 12/24/2017 at Unknown time  . lamoTRIgine (LAMICTAL) 150 MG tablet Take 1 tablet (150 mg total) by mouth daily. 90 tablet 1 12/23/2017 at Unknown time  . lisinopril (PRINIVIL,ZESTRIL) 10 MG tablet Take 10 mg by mouth daily.    12/23/2017 at Unknown time  . omeprazole (PRILOSEC) 20 MG capsule Take 40 mg by mouth every morning.   12/23/2017  at Unknown time  . QUEtiapine (SEROQUEL) 200 MG tablet Take 1 tablet (200 mg total) by mouth at bedtime. 90 tablet 2 12/23/2017 at Unknown time  . QUEtiapine (SEROQUEL) 50 MG tablet Take 1 tablet (50 mg total) by mouth daily. 90 tablet 2 12/23/2017 at Unknown time  . senna (SENOKOT) 8.6 MG TABS tablet Take 1 tablet (8.6 mg total) by mouth 2 (two) times daily. 120 each 0 Past Month at Unknown time  . traZODone (DESYREL) 150 MG tablet Take 1 tablet (150 mg total) by mouth at bedtime as needed for sleep. 90 tablet 3 12/23/2017 at Unknown time  . traZODone (DESYREL) 50 MG tablet Daily prn 90 tablet 1 12/23/2017 at Unknown time    Patient Stressors: Marital or family conflict Medication change or noncompliance Substance abuse  Patient Strengths: Capable of  independent living Agricultural engineer for treatment/growth  Treatment Modalities: Medication Management, Group therapy, Case management,  1 to 1 session with clinician, Psychoeducation, Recreational therapy.   Physician Treatment Plan for Primary Diagnosis: Bipolar I disorder, current or most recent episode depressed, with psychotic features ([place]) Long Term Goal(s): Improvement in symptoms so as ready for discharge Improvement in symptoms so as ready for discharge   Short Term Goals: Ability to identify changes in lifestyle to reduce recurrence of condition will improve Ability to verbalize feelings will improve Ability to disclose and discuss suicidal ideas Ability to demonstrate self-control will improve Ability to identify triggers associated with substance abuse/mental health issues will improve Ability to identify changes in lifestyle to reduce recurrence of condition will improve Ability to demonstrate self-control will improve Ability to identify triggers associated with substance abuse/mental health issues will improve  Medication Management: Evaluate patient's response, side effects, and tolerance of medication regimen.  Therapeutic Interventions: 1 to 1 sessions, Unit Group sessions and Medication administration.  Evaluation of Outcomes: Progressing  Physician Treatment Plan for Secondary Diagnosis: Principal Problem:   Bipolar I disorder, current or most recent episode depressed, with psychotic features ([place]) Active Problems:   HTN (hypertension)   Essential (primary) hypertension   Personal history of traumatic brain injury   Seizure ([place])   Suicidal ideation   Cannabis use disorder, moderate, dependence ([place])  Long Term Goal(s): Improvement in symptoms so as ready for discharge Improvement in symptoms so as ready for discharge   Short Term Goals: Ability to identify changes in lifestyle to reduce recurrence of condition will improve Ability to  verbalize feelings will improve Ability to disclose and discuss suicidal ideas Ability to demonstrate self-control will improve Ability to identify triggers associated with substance abuse/mental health issues will improve Ability to identify changes in lifestyle to reduce recurrence of condition will improve Ability to demonstrate self-control will improve Ability to identify triggers associated with substance abuse/mental health issues will improve     Medication Management: Evaluate patient's response, side effects, and tolerance of medication regimen.  Therapeutic Interventions: 1 to 1 sessions, Unit Group sessions and Medication administration.  Evaluation of Outcomes: Progressing   RN Treatment Plan for Primary Diagnosis: Bipolar I disorder, current or most recent episode depressed, with psychotic features ([employer]) Long Term Goal(s): Knowledge of disease and therapeutic regimen to maintain health will improve  Short Term Goals: Ability to identify and develop effective coping behaviors will improve and Compliance with prescribed medications will improve  Medication Management: RN will administer medications as ordered by provider, will assess and evaluate patient's response and provide education to patient for prescribed medication. RN will report any adverse  and/or side effects to prescribing provider.  Therapeutic Interventions: 1 on 1 counseling sessions, Psychoeducation, Medication administration, Evaluate responses to treatment, Monitor vital signs and CBGs as ordered, Perform/monitor CIWA, COWS, AIMS and Fall Risk screenings as ordered, Perform wound care treatments as ordered.  Evaluation of Outcomes: Progressing   LCSW Treatment Plan for Primary Diagnosis: Bipolar I disorder, current or most recent episode depressed, with psychotic features ([place]) Long Term Goal(s): Safe transition to appropriate next level of care at discharge, Engage patient in therapeutic group addressing  interpersonal concerns.  Short Term Goals: Engage patient in aftercare planning with referrals and resources, Increase social support, Identify triggers associated with mental health/substance abuse issues and Increase skills for wellness and recovery  Therapeutic Interventions: Assess for all discharge needs, 1 to 1 time with Social worker, Explore available resources and support systems, Assess for adequacy in community support network, Educate family and significant other(s) on suicide prevention, Complete Psychosocial Assessment, Interpersonal group therapy.  Evaluation of Outcomes: Progressing   Progress in Treatment: Attending groups: No. Participating in groups: No. Taking medication as prescribed: Yes. Toleration medication: Yes. Family/Significant other contact made: Yes, individual(s) contacted:  [name] Muecke(Mother) [phone] Patient understands diagnosis: Yes. Discussing patient identified problems/goals with staff: Yes. Medical problems stabilized or resolved: Yes. Denies suicidal/homicidal ideation: Yes. Issues/concerns per patient self-inventory: No. Other:   New problem(s) identified: No, Describe:  None  New Short Term/Long Term Goal(s): "Need to have some check ups."  Patient Goals:  "Need to have some check ups."  Discharge Plan or Barriers: To discharge home and follow up with outpatient treatment.   Reason for Continuation of Hospitalization: Anxiety Medication stabilization  Estimated Length of Stay: 3-5 days  Attendees: Patient:[name] 12/27/2017 3:51 PM  Physician:  Dr. Bary Leriche, MD 12/27/2017 3:51 PM  Nursing: Jerry Caras, RN 12/27/2017 3:51 PM  RN Care Manager: 12/27/2017 3:51 PM  Social Worker: Darin Engels, [place] 12/27/2017 3:51 PM  Recreational Therapist: Isaias Sakai. Marcello Fennel, LRT 12/27/2017 3:51 PM  Other: Sanjuana Kava, LCSW 12/27/2017 3:51 PM  Other:  12/27/2017 3:51 PM  Other: 12/27/2017 3:51 PM    Scribe for  Treatment Team: Darin Engels, LCSW 12/27/2017 3:51 PM

## 2017-12-27 NOTE — Plan of Care (Signed)
Patient is alert to self and place, denies SI, HI, when asked about auditory hallucinations patient responds, "Not today, not yes they come and go." Patient is very pleasant, received education on medications. Patient states his depression is not as high as yesterday." I have been talking to some people here and I feel better." Patient has been in the milieu interacting with peers. No self injurious behaviors noted. Nurse will continue to monitor. 15 minute safety checks to continue. Problem: Activity: Goal: Will identify at least one activity in which they can participate Outcome: Progressing   Problem: Coping: Goal: Ability to identify and develop effective coping behavior will improve Outcome: Progressing Goal: Ability to interact with others will improve Outcome: Progressing Goal: Demonstration of participation in decision-making regarding own care will improve Outcome: Progressing Goal: Ability to use eye contact when communicating with others will improve Outcome: Not Progressing   Problem: Safety: Goal: Ability to remain free from injury will improve Outcome: Progressing

## 2017-12-28 DIAGNOSIS — F17200 Nicotine dependence, unspecified, uncomplicated: Secondary | ICD-10-CM | POA: Diagnosis present

## 2017-12-28 MED ORDER — DOXYCYCLINE HYCLATE 100 MG PO TABS: 100.0000 mg | ORAL_TABLET | Freq: Two times a day (BID) | ORAL | 0 refills | Status: DC

## 2017-12-28 MED ORDER — FLUVOXAMINE MALEATE 100 MG PO TABS: 100.0000 mg | ORAL_TABLET | Freq: Every day | ORAL | 1 refills | Status: DC

## 2017-12-28 NOTE — Progress Notes (Signed)
Recreation Therapy Notes   Date: 12/28/2017  Time: 9:30 am   Location: Craft room   Behavioral response: N/A   Intervention Topic: Problem Solving  Discussion/Intervention: Patient did not attend group.   Clinical Observations/Feedback:  Patient did not attend group.   Gerarda Conklin LRT/CTRS         Lecia Esperanza 12/28/2017 10:33 AM

## 2017-12-28 NOTE — Discharge Instructions (Signed)
Please follow up with [facility]  [phone].

## 2017-12-28 NOTE — BHH Suicide Risk Assessment (Signed)
[facility] Discharge Suicide Risk Assessment   Principal Problem: Bipolar I disorder, current or most recent episode depressed, with psychotic features [facility]) Discharge Diagnoses:  Patient Active Problem List   Diagnosis Date Noted  . Bipolar I disorder, current or most recent episode depressed, with psychotic features ([place]) [F31.5] 12/25/2017    Priority: High  . Suicidal ideation [R45.851] 12/25/2017  . Cannabis use disorder, moderate, dependence ([place]) [F12.20] 12/25/2017  . Borderline diabetes mellitus [R73.03] 08/27/2015  . Combined fat and carbohydrate induced hyperlipemia [E78.2] 08/27/2015  . Anxiety [F41.9] 05/21/2015  . Moderate episode of recurrent major depressive disorder ([place]) [F33.1] 05/21/2015  . Seizure ([place]) [R56.9] 05/21/2015  . Cognitive decline [R41.89] 04/30/2015  . Abdominal distension [R14.0] 02/24/2015  . Hyponatremia [E87.1] 02/24/2015  . HTN (hypertension) [I10] 02/24/2015  . Hx of traumatic brain injury [Z87.820] 02/24/2015  . HTN (hypertension), benign [I10] 02/24/2015  . Abdominal distension (gaseous) [R14.0] 02/24/2015  . Essential (primary) hypertension [I10] 02/24/2015  . Hypo-osmolality and hyponatremia [E87.1] 02/24/2015  . Personal history of traumatic brain injury [Z87.820] 02/24/2015  . Insomnia, persistent [G47.00] 01/09/2015    Total Time spent with patient: 20 minutes  Musculoskeletal: Strength & Muscle Tone: within normal limits Gait & Station: normal Patient leans: N/A  Psychiatric Specialty Exam: Review of Systems  Neurological: Negative.   Psychiatric/Behavioral: Negative.   All other systems reviewed and are negative.   Blood pressure 110/88, pulse (!) 115, temperature 98.2 F (36.8 C), temperature source Oral, resp. rate 16, height 6\' 1"  (1.854 m), weight 90.7 kg, SpO2 98 %.Body mass index is 26.39 kg/m.  General Appearance: Casual  Eye Contact::  Good  Speech:  Clear and Coherent409  Volume:  Normal  Mood:  Euthymic  Affect:   Appropriate  Thought Process:  Goal Directed and Descriptions of Associations: Intact  Orientation:  Full (Time, Place, and Person)  Thought Content:  WDL  Suicidal Thoughts:  No  Homicidal Thoughts:  No  Memory:  Immediate;   Fair Recent;   Fair Remote;   Fair  Judgement:  Impaired  Insight:  Present  Psychomotor Activity:  Normal  Concentration:  Fair  Recall:  [employer] of Knowledge:Fair  Language: Fair  Akathisia:  No  Handed:  Right  AIMS (if indicated):     Assets:  Communication Skills Desire for Improvement Financial Resources/Insurance Housing Resilience Social Support Transportation  Sleep:  Number of Hours: 5  Cognition: WNL  ADL's:  Intact   Mental Status Per Nursing Assessment::   On Admission:  Thoughts of violence towards others  Demographic Factors:  Male, Divorced or widowed and Caucasian  Loss Factors: Decrease in vocational status  Historical Factors: Prior suicide attempts, Family history of mental illness or substance abuse and Impulsivity  Risk Reduction Factors:   Sense of responsibility to family, Living with another person, especially a relative, Positive social support and Positive therapeutic relationship  Continued Clinical Symptoms:  Bipolar Disorder:   Depressive phase  Cognitive Features That Contribute To Risk:  None    Suicide Risk:  Minimal: No identifiable suicidal ideation.  Patients presenting with no risk factors but with morbid ruminations; may be classified as minimal risk based on the severity of the depressive symptoms    Plan Of Care/Follow-up recommendations:  Activity:  as tolerated Diet:  low sodium heart healthy Other:  keep follow up appointments  Orson Slick, MD 12/28/2017, 10:00 AM

## 2017-12-28 NOTE — Progress Notes (Signed)
Recreation Therapy Notes  INPATIENT RECREATION TR PLAN  Patient Details Name: [name] MRN: [id number] DOB: 10-10-1970 Today's Date: 12/28/2017  Rec Therapy Plan Is patient appropriate for Therapeutic Recreation?: Yes Treatment times per week: at least 3 Estimated Length of Stay: 5-7 days TR Treatment/Interventions: Group participation (Comment)  Discharge Criteria Pt will be discharged from therapy if:: Discharged Treatment plan/goals/alternatives discussed and agreed upon by:: Patient/family  Discharge Summary Short term goals set: Patient will engage in groups without prompting or encouragement from LRT x3 group sessions within 5 recreation therapy group sessions Short term goals met: Not met Reason goals not met: Patient did not attend any groups Therapeutic equipment acquired: N/A Reason patient discharged from therapy: Discharge from hospital Pt/family agrees with progress & goals achieved: Yes Date patient discharged from therapy: 12/28/17   Jwan Hornbaker 12/28/2017, 12:42 PM

## 2017-12-28 NOTE — Discharge Planning (Signed)
Patient is alert and oriented X 4 denies SI, HI and AVH. Pain rating of 0/10 Patient received discharge information along with paper prescription. Patient received belongings from locker, brown belt and shoe strings. Mother awaited to take patient home.
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
## 2017-12-28 NOTE — Plan of Care (Signed)
Patient is improving and coping well , treatment is effective, compliance with his therapeutic regimen, seeing socializing with peers with out any issues, no episodes of agitations or violence , contract for safety of self and others , denies any SI/HI/AVH , sleeping long hours without interruptions, 15 minute rounding in progress no distress.   Problem: Education: Goal: Knowledge of [facility] Education information/materials will improve Outcome: Progressing Goal: Emotional status will improve Outcome: Progressing Goal: Mental status will improve Outcome: Progressing   Problem: Health Behavior/Discharge Planning: Goal: Compliance with treatment plan for underlying cause of condition will improve Outcome: Progressing   Problem: Physical Regulation: Goal: Ability to maintain clinical measurements within normal limits will improve Outcome: Progressing   Problem: Safety: Goal: Periods of time without injury will increase Outcome: Progressing   Problem: Activity: Goal: Will identify at least one activity in which they can participate Outcome: Progressing   Problem: Coping: Goal: Ability to identify and develop effective coping behavior will improve Outcome: Progressing Goal: Ability to interact with others will improve Outcome: Progressing Goal: Demonstration of participation in decision-making regarding own care will improve Outcome: Progressing Goal: Ability to use eye contact when communicating with others will improve Outcome: Progressing   Problem: Safety: Goal: Ability to remain free from injury will improve Outcome: Progressing

## 2017-12-28 NOTE — Progress Notes (Signed)
  [facility] Adult Case Management Discharge Plan :  Will you be returning to the same living situation after discharge:  Yes,  Pt lives with his mother At discharge, do you have transportation home?: Yes,  Pt mother will provide transportation Do you have the ability to pay for your medications: Yes,  Insurance  Release of information consent forms completed and in the chart;  Patient's signature needed at discharge.  Patient to Follow up at: Follow-up Information    [facility]. Go on 01/08/2018.   Why:  Please follow up with Dr. Gretel Acre on Monday, January 08, 2018 at 1030am. Thank you.          Next level of care provider has access to [place] and Suicide Prevention discussed: Yes,  with pt's mother  Have you used any form of tobacco in the last 30 days? (Cigarettes, Smokeless Tobacco, Cigars, and/or Pipes): Yes  Has patient been referred to the Quitline?: Patient refused referral  Patient has been referred for addiction treatment: Yes  Seniya Stoffers T Tyreik Delahoussaye, LCSW 12/28/2017, 11:47 AM

## 2018-01-08 ENCOUNTER — Other Ambulatory Visit: Payer: Self-pay

## 2018-01-08 ENCOUNTER — Encounter: Payer: Self-pay | Admitting: Psychiatry

## 2018-01-08 ENCOUNTER — Ambulatory Visit: Payer: Medicare HMO | Admitting: Psychiatry

## 2018-01-08 ENCOUNTER — Ambulatory Visit (INDEPENDENT_AMBULATORY_CARE_PROVIDER_SITE_OTHER): Payer: Medicare HMO | Admitting: Psychiatry

## 2018-01-08 VITALS — BP 125/76 | HR 77 | Temp 97.7°F | Wt 206.8 lb

## 2018-01-08 DIAGNOSIS — F39 Unspecified mood [affective] disorder: Secondary | ICD-10-CM

## 2018-01-08 DIAGNOSIS — F908 Attention-deficit hyperactivity disorder, other type: Secondary | ICD-10-CM

## 2018-01-08 NOTE — Progress Notes (Signed)
Psychiatric MD Progress Note   Patient Identification: [name] MRN:  [id number] Date of Evaluation:  01/08/2018 Referral Source: [facility] Chief Complaint:   Chief Complaint    Follow-up     Visit Diagnosis:    ICD-10-CM   1. Episodic mood disorder ([place]) F39   2. Attention deficit hyperactivity disorder (ADHD), other type F90.8    Diagnosis:   Patient Active Problem List   Diagnosis Date Noted  . Tobacco use disorder [F17.200] 12/28/2017  . Bipolar I disorder, current or most recent episode depressed, with psychotic features ([place]) [F31.5] 12/25/2017  . Suicidal ideation [R45.851] 12/25/2017  . Cannabis use disorder, moderate, dependence ([place]) [F12.20] 12/25/2017  . Borderline diabetes mellitus [R73.03] 08/27/2015  . Combined fat and carbohydrate induced hyperlipemia [E78.2] 08/27/2015  . Anxiety [F41.9] 05/21/2015  . Moderate episode of recurrent major depressive disorder ([place]) [F33.1] 05/21/2015  . Seizure ([place]) [R56.9] 05/21/2015  . Cognitive decline [R41.89] 04/30/2015  . Abdominal distension [R14.0] 02/24/2015  . Hyponatremia [E87.1] 02/24/2015  . HTN (hypertension) [I10] 02/24/2015  . Hx of traumatic brain injury [Z87.820] 02/24/2015  . HTN (hypertension), benign [I10] 02/24/2015  . Abdominal distension (gaseous) [R14.0] 02/24/2015  . Essential (primary) hypertension [I10] 02/24/2015  . Hypo-osmolality and hyponatremia [E87.1] 02/24/2015  . Personal history of traumatic brain injury [Z87.820] 02/24/2015  . Insomnia, persistent [G47.00] 01/09/2015   History of Present Illness:    Patient is a 47 year old white male who presented  for the follow-up. He was accompanied by his mother.  Patient was recently discharged from the inpatient psychiatric unit 10 days ago where he was admitted due to increase in suicidal thoughts and paranoia.  His mother reported that he was admitted there voluntarily.  Patient was started on Luvox and his citalopram was  discontinued.  Other medications were continued as usual.  She reported that he feels more calm.  He continues to be concerned about his financial situation.  His mother reported that she has not noticed any change in his behavior but he is not paranoid at this time.  Patient reported that his that lamotrigine was also increased by his neurologist before his admission to the inpatient psychiatric unit and he is not taking a blue pill which is 200 mg.  His mother is watchful about his medication and she is helping him with his medications on a regular basis.  Patient stated that he also takes trazodone at bedtime to help him sleep.  He reported that he is trying to get money so he can fix his car.  He remains concerned about his car.  He denied having any suicidal homicidal ideations or plans at this time.  He denied having any perceptual disturbances.  He is compliant with his medication.  We discussed about titrating the dose of Luvox and he agreed with the plan.     His mother remains supportive of him.   Past Medical History:  Past Medical History:  Diagnosis Date  . Anxiety   . Chronic pain   . Head injury   . Hypertension   . Insomnia   . Poor historian   . Reflux   . Seizures ([place])     Past Surgical History:  Procedure Laterality Date  . ABDOMINAL SURGERY    . BRAIN SURGERY     Family History:  Family History  Problem Relation Age of Onset  . Cancer Mother   . Cancer Father   . Alcohol abuse Father   . Schizophrenia Brother  Social History:   Social History   Socioeconomic History  . Marital status: Single    Spouse name: Not on file  . Number of children: Not on file  . Years of education: Not on file  . Highest education level: Not on file  Occupational History  . Not on file  Social Needs  . Financial resource strain: Not very hard  . Food insecurity:    Worry: Not on file    Inability: Not on file  . Transportation needs:    Medical: No    Non-medical: No   Tobacco Use  . Smoking status: Current Every Day Smoker    Packs/day: 1.00    Types: Cigarettes    Start date: 05/21/1990  . Smokeless tobacco: Former Systems developer    Quit date: 05/20/1985  . Tobacco comment: refused  Substance and Sexual Activity  . Alcohol use: Yes    Alcohol/week: 2.0 standard drinks    Types: 2 Shots of liquor per week    Comment: a couple 6 packs beer daily  . Drug use: Yes    Types: Marijuana    Comment: occ  . Sexual activity: Not Currently  Lifestyle  . Physical activity:    Days per week: 0 days    Minutes per session: Not on file  . Stress: Rather much  Relationships  . Social connections:    Talks on phone: Not on file    Gets together: Twice a week    Attends religious service: Not on file    Active member of club or organization: Not on file    Attends meetings of clubs or organizations: Not on file    Relationship status: Not on file  Other Topics Concern  . Not on file  Social History Narrative   ** Merged History Encounter **       Additional Social History:  Currently lives with his mother who is very supportive. 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 He currently denied any pending legal charges. He denied using any drugs or alcohol at this time  Musculoskeletal: Strength & Muscle Tone: within normal limits Gait & Station: normal Patient leans: N/A  Psychiatric Specialty Exam: Medication Refill   Insomnia  PMH includes: depression.  Anxiety  Symptoms include insomnia and nervous/anxious behavior.    Depression         Associated symptoms include insomnia.  Past medical history includes anxiety.     Review of Systems  Psychiatric/Behavioral: Positive for depression. The patient is nervous/anxious and has insomnia.     Blood pressure 125/76, pulse 77, temperature 97.7 F (36.5 C), temperature source Oral, weight 206 lb 12.8 oz (93.8 kg).Body mass index is 27.28  kg/m.  General Appearance: Casual  Eye Contact:  Fair  Speech:  Clear and Coherent  Volume:  Normal  Mood:  Anxious  Affect:  Depressed  Thought Process:  Goal Directed  Orientation:  Full (Time, Place, and Person)  Thought Content:  WDL  Suicidal Thoughts:  No  Homicidal Thoughts:  No  Memory:  imparied  Judgement:  Impaired  Insight:  Lacking  Psychomotor Activity:  Normal  Concentration:  Fair  Recall:  [place] of Knowledge:Fair  Language: Fair  Akathisia:  No  Handed:  Right  AIMS (if indicated):    Assets:  Desire for Improvement Financial Resources/Insurance Social Support  ADL's:  Intact  Cognition:  WNL  Sleep:  fair   Is the patient at risk to self?  No. Has the patient been a risk to self in the past 6 months?  No. Has the patient been a risk to self within the distant past?  No. Is the patient a risk to others?  No. Has the patient been a risk to others in the past 6 months?  No. Has the patient been a risk to others within the distant past?  No.  Allergies:   Allergies  Allergen Reactions  . Amitriptyline Other (See Comments)    Paralysis of pt    Current Medications: Current Outpatient Medications  Medication Sig Dispense Refill  . albuterol (PROVENTIL HFA;VENTOLIN HFA) 108 (90 Base) MCG/ACT inhaler Inhale 2 puffs into the lungs every 6 (six) hours as needed for wheezing or shortness of breath. 1 Inhaler 2  . atomoxetine (STRATTERA) 60 MG capsule Take 1 capsule (60 mg total) by mouth daily. (Patient taking differently: Take 60 mg by mouth at bedtime. ) 90 capsule 2  . doxycycline (VIBRA-TABS) 100 MG tablet Take 1 tablet (100 mg total) by mouth every 12 (twelve) hours. 14 tablet 0  . fluvoxaMINE (LUVOX) 100 MG tablet Take 1 tablet (100 mg total) by mouth at bedtime. 30 tablet 1  . lamoTRIgine (LAMICTAL) 150 MG tablet Take 1 tablet (150 mg total) by mouth daily. 90 tablet 1  . omeprazole (PRILOSEC) 20 MG capsule Take 40 mg by mouth every morning.    Marland Kitchen  QUEtiapine (SEROQUEL) 200 MG tablet Take 1 tablet (200 mg total) by mouth at bedtime. 90 tablet 2  . QUEtiapine (SEROQUEL) 50 MG tablet Take 1 tablet (50 mg total) by mouth daily. 90 tablet 2  . senna (SENOKOT) 8.6 MG TABS tablet Take 1 tablet (8.6 mg total) by mouth 2 (two) times daily. 120 each 0  . traZODone (DESYREL) 150 MG tablet Take 1 tablet (150 mg total) by mouth at bedtime as needed for sleep. 90 tablet 3  . lisinopril (PRINIVIL,ZESTRIL) 10 MG tablet Take 10 mg by mouth daily.      No current facility-administered medications for this visit.     Previous Psychotropic Medications: He is currently taking BuSpar and trazodone Celexa. He is also on Tegretol for his seizure disorder  Substance Abuse History in the last 12 months:  Yes.    Beer - last month   Consequences of Substance Abuse: Legal Consequences:  spent most of my life in prison  Medical Decision Making:  Review of Psycho-Social Stressors (1) and Review and summation of old records (2)  Treatment Plan Summary: Medication management   Continue medications as follows Luvox 100 mg daily Continue  Seroquel 200 mg at bedtime. Seroquel 50 mg at bedtime. Trazodone 150 mg at bedtime.  Additional 50 mg is also prescribed. Strattera 60 mg  Patient will follow-up in 2 weeks  or earlier depending on his symptoms.    More than 50% of the time spent in psychoeducation, counseling and coordination of care.      This note was generated in part or whole with voice recognition software. Voice regonition is usually quite accurate but there are transcription errors that can and very often do occur. I apologize for any typographical errors that were not detected and corrected.    Rainey Pines, MD  10/28/201910:44 AM

## 2018-01-22 ENCOUNTER — Ambulatory Visit: Payer: Medicare HMO | Admitting: Psychiatry

## 2018-01-22 ENCOUNTER — Other Ambulatory Visit: Payer: Self-pay

## 2018-01-22 ENCOUNTER — Encounter: Payer: Self-pay | Admitting: Psychiatry

## 2018-01-22 VITALS — BP 145/91 | HR 101 | Temp 97.5°F | Wt 207.6 lb

## 2018-01-22 DIAGNOSIS — F908 Attention-deficit hyperactivity disorder, other type: Secondary | ICD-10-CM

## 2018-01-22 DIAGNOSIS — F39 Unspecified mood [affective] disorder: Secondary | ICD-10-CM | POA: Diagnosis not present

## 2018-01-22 MED ORDER — FLUVOXAMINE MALEATE 100 MG PO TABS: 100.0000 mg | ORAL_TABLET | Freq: Every day | ORAL | 1 refills | Status: DC

## 2018-01-22 NOTE — Progress Notes (Signed)
Psychiatric MD Progress Note   Patient Identification: [name] MRN:  [id number] Date of Evaluation:  01/22/2018 Referral Source: [facility] Chief Complaint:   Chief Complaint    Follow-up; Medication Refill     Visit Diagnosis:    ICD-10-CM   1. Episodic mood disorder ([place]) F39   2. Attention deficit hyperactivity disorder (ADHD), other type F90.8    Diagnosis:   Patient Active Problem List   Diagnosis Date Noted  . Tobacco use disorder [F17.200] 12/28/2017  . Bipolar I disorder, current or most recent episode depressed, with psychotic features ([place]) [F31.5] 12/25/2017  . Suicidal ideation [R45.851] 12/25/2017  . Cannabis use disorder, moderate, dependence ([place]) [F12.20] 12/25/2017  . Borderline diabetes mellitus [R73.03] 08/27/2015  . Combined fat and carbohydrate induced hyperlipemia [E78.2] 08/27/2015  . Anxiety [F41.9] 05/21/2015  . Moderate episode of recurrent major depressive disorder ([place]) [F33.1] 05/21/2015  . Seizure ([place]) [R56.9] 05/21/2015  . Cognitive decline [R41.89] 04/30/2015  . Abdominal distension [R14.0] 02/24/2015  . Hyponatremia [E87.1] 02/24/2015  . HTN (hypertension) [I10] 02/24/2015  . Hx of traumatic brain injury [Z87.820] 02/24/2015  . HTN (hypertension), benign [I10] 02/24/2015  . Abdominal distension (gaseous) [R14.0] 02/24/2015  . Essential (primary) hypertension [I10] 02/24/2015  . Hypo-osmolality and hyponatremia [E87.1] 02/24/2015  . Personal history of traumatic brain injury [Z87.820] 02/24/2015  . Insomnia, persistent [G47.00] 01/09/2015   History of Present Illness:    Patient is a 47 year old white male who presented  for the follow-up. He was accompanied by his mother.  Patient reported that he has been doing well.  His mother also mentioned that he has improved since the medications were adjusted in the inpatient unit.  Patient reported that he was drinking over the weekend.  He was minimizing his alcohol use.  He was also  minimizing the use of cannabis.  He smelled of cannabis during the interview.   His mother reported that she is helping him with the medications.  Patient reported that he is trying to have his eyeglasses adjusted as he does not  See well.  Patient reported that he sleeps well at night with the help of the Seroquel.  Discussed with him about the medications and the side effects in detail.  Also advised him to decrease the use of alcohol as his blood pressure is currently elevated as he might be going into the withdrawals and the mother and the patient demonstrated understanding.  Patient reported that he is trying to find some job for himself.  His mother remains involved in his care and is supportive.     She currently denied having any suicidal homicidal ideations or plans.  He denied having any perceptual disturbances.  He is going to follow up with his primary care physician for labs and medication adjustment as well.      His mother remains supportive of him.   Past Medical History:  Past Medical History:  Diagnosis Date  . Anxiety   . Chronic pain   . Head injury   . Hypertension   . Insomnia   . Poor historian   . Reflux   . Seizures ([place])     Past Surgical History:  Procedure Laterality Date  . ABDOMINAL SURGERY    . BRAIN SURGERY     Family History:  Family History  Problem Relation Age of Onset  . Cancer Mother   . Cancer Father   . Alcohol abuse Father   . Schizophrenia Brother    Social History:  Social History   Socioeconomic History  . Marital status: Single    Spouse name: Not on file  . Number of children: Not on file  . Years of education: Not on file  . Highest education level: Not on file  Occupational History  . Not on file  Social Needs  . Financial resource strain: Not very hard  . Food insecurity:    Worry: Not on file    Inability: Not on file  . Transportation needs:    Medical: No    Non-medical: No  Tobacco Use  . Smoking status:  Current Every Day Smoker    Packs/day: 1.00    Types: Cigarettes    Start date: 05/21/1990  . Smokeless tobacco: Former Systems developer    Quit date: 05/20/1985  . Tobacco comment: refused  Substance and Sexual Activity  . Alcohol use: Yes    Alcohol/week: 2.0 standard drinks    Types: 2 Shots of liquor per week    Comment: a couple 6 packs beer daily  . Drug use: Yes    Types: Marijuana    Comment: occ  . Sexual activity: Not Currently  Lifestyle  . Physical activity:    Days per week: 0 days    Minutes per session: Not on file  . Stress: Rather much  Relationships  . Social connections:    Talks on phone: Not on file    Gets together: Twice a week    Attends religious service: Not on file    Active member of club or organization: Not on file    Attends meetings of clubs or organizations: Not on file    Relationship status: Not on file  Other Topics Concern  . Not on file  Social History Narrative   ** Merged History Encounter **       Additional Social History:  Currently lives with his mother who is very supportive. 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 He currently denied any pending legal charges. He denied using any drugs or alcohol at this time  Musculoskeletal: Strength & Muscle Tone: within normal limits Gait & Station: normal Patient leans: N/A  Psychiatric Specialty Exam: Medication Refill   Insomnia  PMH includes: depression.  Anxiety  Symptoms include insomnia and nervous/anxious behavior.    Depression         Associated symptoms include insomnia.  Past medical history includes anxiety.     Review of Systems  Psychiatric/Behavioral: Positive for depression. The patient is nervous/anxious and has insomnia.     Blood pressure (!) 145/91, pulse (!) 101, temperature (!) 97.5 F (36.4 C), temperature source Oral, weight 207 lb 9.6 oz (94.2 kg).Body mass index is 27.39 kg/m.  General  Appearance: Casual  Eye Contact:  Fair  Speech:  Clear and Coherent  Volume:  Normal  Mood:  Anxious  Affect:  Depressed  Thought Process:  Goal Directed  Orientation:  Full (Time, Place, and Person)  Thought Content:  WDL  Suicidal Thoughts:  No  Homicidal Thoughts:  No  Memory:  imparied  Judgement:  Impaired  Insight:  Lacking  Psychomotor Activity:  Normal  Concentration:  Fair  Recall:  [place] of Knowledge:Fair  Language: Fair  Akathisia:  No  Handed:  Right  AIMS (if indicated):    Assets:  Desire for Improvement Financial Resources/Insurance Social Support  ADL's:  Intact  Cognition: WNL  Sleep:  fair   Is the patient at risk to self?  No. Has the patient been a risk to self in the past 6 months?  No. Has the patient been a risk to self within the distant past?  No. Is the patient a risk to others?  No. Has the patient been a risk to others in the past 6 months?  No. Has the patient been a risk to others within the distant past?  No.  Allergies:   Allergies  Allergen Reactions  . Amitriptyline Other (See Comments)    Paralysis of pt    Current Medications: Current Outpatient Medications  Medication Sig Dispense Refill  . albuterol (PROVENTIL HFA;VENTOLIN HFA) 108 (90 Base) MCG/ACT inhaler Inhale 2 puffs into the lungs every 6 (six) hours as needed for wheezing or shortness of breath. 1 Inhaler 2  . atomoxetine (STRATTERA) 60 MG capsule Take 1 capsule (60 mg total) by mouth daily. (Patient taking differently: Take 60 mg by mouth at bedtime. ) 90 capsule 2  . doxycycline (VIBRA-TABS) 100 MG tablet Take 1 tablet (100 mg total) by mouth every 12 (twelve) hours. 14 tablet 0  . fluvoxaMINE (LUVOX) 100 MG tablet Take 1 tablet (100 mg total) by mouth at bedtime. 30 tablet 1  . lamoTRIgine (LAMICTAL) 150 MG tablet Take 1 tablet (150 mg total) by mouth daily. 90 tablet 1  . omeprazole (PRILOSEC) 20 MG capsule Take 40 mg by mouth every morning.    Marland Kitchen QUEtiapine  (SEROQUEL) 200 MG tablet Take 1 tablet (200 mg total) by mouth at bedtime. 90 tablet 2  . QUEtiapine (SEROQUEL) 50 MG tablet Take 1 tablet (50 mg total) by mouth daily. 90 tablet 2  . senna (SENOKOT) 8.6 MG TABS tablet Take 1 tablet (8.6 mg total) by mouth 2 (two) times daily. 120 each 0  . traZODone (DESYREL) 150 MG tablet Take 1 tablet (150 mg total) by mouth at bedtime as needed for sleep. 90 tablet 3  . lisinopril (PRINIVIL,ZESTRIL) 10 MG tablet Take 10 mg by mouth daily.      No current facility-administered medications for this visit.     Previous Psychotropic Medications: He is currently taking BuSpar and trazodone Celexa. He is also on Tegretol for his seizure disorder  Substance Abuse History in the last 12 months:  Yes.    Beer - last month   Consequences of Substance Abuse: Legal Consequences:  spent most of my life in prison  Medical Decision Making:  Review of Psycho-Social Stressors (1) and Review and summation of old records (2)  Treatment Plan Summary: Medication management   Continue medications as follows Luvox 100 mg daily Continue  Seroquel 200 mg at bedtime. Seroquel 50 mg at bedtime. Trazodone 150 mg at bedtime.  Additional 50 mg is also prescribed. Strattera 60 mg  Patient will follow-up in 2 months   or earlier depending on his symptoms.    More than 50% of the time spent in psychoeducation, counseling and coordination of care.      This note was generated in part or whole with voice recognition software. Voice regonition is usually quite accurate but there are transcription errors that can and very often do occur. I apologize for any typographical errors that were not detected and corrected.    Rainey Pines, MD  11/11/201910:00 AM

## 2018-02-14 ENCOUNTER — Other Ambulatory Visit: Payer: Self-pay | Admitting: Psychiatry

## 2018-02-14 MED ORDER — LAMOTRIGINE 200 MG PO TABS: 200.0000 mg | ORAL_TABLET | Freq: Every day | ORAL | 2 refills | Status: DC

## 2018-02-15 ENCOUNTER — Emergency Department: Payer: Medicare HMO

## 2018-02-15 ENCOUNTER — Emergency Department
Admission: EM | Admit: 2018-02-15 | Discharge: 2018-02-15 | Disposition: A | Discharge status: Home / Self Care | Payer: Medicare HMO | Attending: Student in an Organized Health Care Education/Training Program | Admitting: Student in an Organized Health Care Education/Training Program

## 2018-02-15 DIAGNOSIS — Z79899 Other long term (current) drug therapy: Secondary | ICD-10-CM | POA: Insufficient documentation

## 2018-02-15 DIAGNOSIS — I10 Essential (primary) hypertension: Secondary | ICD-10-CM | POA: Diagnosis not present

## 2018-02-15 DIAGNOSIS — D3501 Benign neoplasm of right adrenal gland: Secondary | ICD-10-CM | POA: Diagnosis not present

## 2018-02-15 DIAGNOSIS — F17210 Nicotine dependence, cigarettes, uncomplicated: Secondary | ICD-10-CM | POA: Diagnosis not present

## 2018-02-15 DIAGNOSIS — R350 Frequency of micturition: Secondary | ICD-10-CM | POA: Insufficient documentation

## 2018-02-15 DIAGNOSIS — F1210 Cannabis abuse, uncomplicated: Secondary | ICD-10-CM | POA: Insufficient documentation

## 2018-02-15 DIAGNOSIS — R109 Unspecified abdominal pain: Secondary | ICD-10-CM | POA: Diagnosis not present

## 2018-02-15 LAB — URINALYSIS, COMPLETE (UACMP) WITH MICROSCOPIC
BILIRUBIN URINE: NEGATIVE
GLUCOSE, UA: NEGATIVE mg/dL
KETONES UR: NEGATIVE mg/dL
Leukocytes, UA: NEGATIVE
NITRITE: NEGATIVE
PH: 6 (ref 5.0–8.0)
Protein, ur: NEGATIVE mg/dL
SPECIFIC GRAVITY, URINE: 1.002 — AB (ref 1.005–1.030)

## 2018-02-15 LAB — CBC WITH DIFFERENTIAL/PLATELET
ABS IMMATURE GRANULOCYTES: 0.04 10*3/uL (ref 0.00–0.07)
BASOS ABS: 0.1 10*3/uL (ref 0.0–0.1)
BASOS PCT: 1 %
Eosinophils Absolute: 0.4 10*3/uL (ref 0.0–0.5)
Eosinophils Relative: 5 %
HCT: 40.6 % (ref 39.0–52.0)
Hemoglobin: 13 g/dL (ref 13.0–17.0)
IMMATURE GRANULOCYTES: 1 %
Lymphocytes Relative: 29 %
Lymphs Abs: 2.3 10*3/uL (ref 0.7–4.0)
MCH: 27.7 pg (ref 26.0–34.0)
MCHC: 32 g/dL (ref 30.0–36.0)
MCV: 86.4 fL (ref 80.0–100.0)
MONOS PCT: 8 %
Monocytes Absolute: 0.6 10*3/uL (ref 0.1–1.0)
NEUTROS ABS: 4.5 10*3/uL (ref 1.7–7.7)
NEUTROS PCT: 56 %
NRBC: 0 % (ref 0.0–0.2)
PLATELETS: 293 10*3/uL (ref 150–400)
RBC: 4.7 MIL/uL (ref 4.22–5.81)
RDW: 14.1 % (ref 11.5–15.5)
WBC: 8 10*3/uL (ref 4.0–10.5)

## 2018-02-15 LAB — COMPREHENSIVE METABOLIC PANEL
ALT: 30 U/L (ref 0–44)
AST: 14 U/L — AB (ref 15–41)
Albumin: 4.4 g/dL (ref 3.5–5.0)
Alkaline Phosphatase: 109 U/L (ref 38–126)
Anion gap: 11 (ref 5–15)
BUN: 10 mg/dL (ref 6–20)
CHLORIDE: 101 mmol/L (ref 98–111)
CO2: 23 mmol/L (ref 22–32)
CREATININE: 0.95 mg/dL (ref 0.61–1.24)
Calcium: 9.3 mg/dL (ref 8.9–10.3)
GFR calc Af Amer: >60 mL/min (ref >60)
GFR calc non Af Amer: >60 mL/min (ref >60)
Glucose, Bld: 108 mg/dL — ABNORMAL HIGH (ref 70–99)
POTASSIUM: 3.5 mmol/L (ref 3.5–5.1)
SODIUM: 135 mmol/L (ref 135–145)
Total Bilirubin: 0.3 mg/dL (ref 0.3–1.2)
Total Protein: 7.8 g/dL (ref 6.5–8.1)

## 2018-02-15 LAB — LIPASE, BLOOD: Lipase: 96 U/L — ABNORMAL HIGH (ref 11–51)

## 2018-02-15 MED ORDER — KETOROLAC TROMETHAMINE 30 MG/ML IJ SOLN
15.0000 mg | Freq: Once | INTRAMUSCULAR | Status: AC
  Administered 2018-02-15: 15 mg via INTRAMUSCULAR
  Filled 2018-02-15: qty 1

## 2018-02-15 MED ORDER — HYDROCODONE-ACETAMINOPHEN 5-325 MG PO TABS
1.0000 | ORAL_TABLET | Freq: Once | ORAL | Status: AC
  Administered 2018-02-15: 1 via ORAL
  Filled 2018-02-15: qty 1

## 2018-02-15 MED ORDER — TAMSULOSIN HCL 0.4 MG PO CAPS: 0.4000 mg | ORAL_CAPSULE | Freq: Every day | ORAL | 0 refills | Status: DC

## 2018-02-15 MED ORDER — KETOROLAC TROMETHAMINE 30 MG/ML IJ SOLN: 15.0000 mg | Freq: Once | INTRAMUSCULAR | Status: DC

## 2018-02-15 MED ORDER — HYDROCODONE-ACETAMINOPHEN 5-325 MG PO TABS: 1.0000 | ORAL_TABLET | ORAL | 0 refills | Status: DC | PRN

## 2018-02-15 NOTE — ED Triage Notes (Signed)
Pt presents today pov for r/s flank pain. Pt denies trouble urinating.

## 2018-02-15 NOTE — ED Notes (Signed)
Patient transported to CT 

## 2018-02-15 NOTE — ED Triage Notes (Signed)
Right flank pain--lower rib around to back.

## 2018-02-15 NOTE — ED Provider Notes (Signed)
[facility] Emergency Department Provider Note    First MD Initiated Contact with Patient 02/15/18 1231     (approximate)  I have reviewed the triage vital signs and the nursing notes.   HISTORY  Chief Complaint Flank Pain    HPI [name] is a 47 y.o. male below listed past medical history presents with 2 weeks of intermittent right flank pain.  States he feels like his been going to the bathroom more frequently but denies any dysuria.  No nausea or vomiting.  No chest pain or shortness of breath.  Is never had pain like this before.  Denies any known history of kidney stones.  Is not had any hematuria.  States that he started having this pain while he was in jail but denies any trauma.    Past Medical History:  Diagnosis Date  . Anxiety   . Chronic pain   . Head injury   . Hypertension   . Insomnia   . Poor historian   . Reflux   . Seizures ([place])    Family History  Problem Relation Age of Onset  . Cancer Mother   . Cancer Father   . Alcohol abuse Father   . Schizophrenia Brother    Past Surgical History:  Procedure Laterality Date  . ABDOMINAL SURGERY    . BRAIN SURGERY     Patient Active Problem List   Diagnosis Date Noted  . Tobacco use disorder 12/28/2017  . Bipolar I disorder, current or most recent episode depressed, with psychotic features ([place]) 12/25/2017  . Suicidal ideation 12/25/2017  . Cannabis use disorder, moderate, dependence ([place]) 12/25/2017  . Borderline diabetes mellitus 08/27/2015  . Combined fat and carbohydrate induced hyperlipemia 08/27/2015  . Anxiety 05/21/2015  . Moderate episode of recurrent major depressive disorder ([place]) 05/21/2015  . Seizure ([place]) 05/21/2015  . Cognitive decline 04/30/2015  . Abdominal distension 02/24/2015  . Hyponatremia 02/24/2015  . HTN (hypertension) 02/24/2015  . Hx of traumatic brain injury 02/24/2015  . HTN (hypertension), benign 02/24/2015  . Abdominal distension  (gaseous) 02/24/2015  . Essential (primary) hypertension 02/24/2015  . Hypo-osmolality and hyponatremia 02/24/2015  . Personal history of traumatic brain injury 02/24/2015  . Insomnia, persistent 01/09/2015      Prior to Admission medications   Medication Sig Start Date End Date Taking? Authorizing Provider  albuterol (PROVENTIL HFA;VENTOLIN HFA) 108 (90 Base) MCG/ACT inhaler Inhale 2 puffs into the lungs every 6 (six) hours as needed for wheezing or shortness of breath. 07/16/15  Yes Triplett, Cari B, FNP  atomoxetine (STRATTERA) 60 MG capsule Take 1 capsule (60 mg total) by mouth daily. Patient taking differently: Take 60 mg by mouth at bedtime.  12/04/17  Yes Rainey Pines, MD  fluvoxaMINE (LUVOX) 100 MG tablet Take 1 tablet (100 mg total) by mouth at bedtime. 01/22/18  Yes Rainey Pines, MD  lamoTRIgine (LAMICTAL) 200 MG tablet Take 1 tablet (200 mg total) by mouth daily. 02/14/18  Yes Rainey Pines, MD  omeprazole (PRILOSEC) 20 MG capsule Take 40 mg by mouth every morning. 08/25/14  Yes [provider]  QUEtiapine (SEROQUEL) 200 MG tablet Take 1 tablet (200 mg total) by mouth at bedtime. 11/06/17  Yes Rainey Pines, MD  QUEtiapine (SEROQUEL) 50 MG tablet Take 1 tablet (50 mg total) by mouth daily. 12/04/17  Yes Rainey Pines, MD  senna (SENOKOT) 8.6 MG TABS tablet Take 1 tablet (8.6 mg total) by mouth 2 (two) times daily. 02/25/15  Yes Vianne Bulls,  Snehal[place], MD  traZODone (DESYREL) 150 MG tablet Take 1 tablet (150 mg total) by mouth at bedtime as needed for sleep. 11/06/17  Yes Rainey Pines, MD  doxycycline (VIBRA-TABS) 100 MG tablet Take 1 tablet (100 mg total) by mouth every 12 (twelve) hours. Patient not taking: Reported on 02/15/2018 12/28/17   Pucilowska, Wardell Honour, MD  HYDROcodone-acetaminophen (NORCO) 5-325 MG tablet Take 1 tablet by mouth every 4 (four) hours as needed for moderate pain. 02/15/18   Merlyn Lot, MD  lisinopril (PRINIVIL,ZESTRIL) 10 MG tablet Take 10 mg by mouth at  bedtime.  02/02/16 12/24/17  [provider]  tamsulosin (FLOMAX) 0.4 MG CAPS capsule Take 1 capsule (0.4 mg total) by mouth daily after supper. 02/15/18   Merlyn Lot, MD    Allergies Amitriptyline    Social History Social History   Tobacco Use  . Smoking status: Current Every Day Smoker    Packs/day: 1.00    Types: Cigarettes    Start date: 05/21/1990  . Smokeless tobacco: Former Systems developer    Quit date: 05/20/1985  . Tobacco comment: refused  Substance Use Topics  . Alcohol use: Yes    Alcohol/week: 2.0 standard drinks    Types: 2 Shots of liquor per week    Comment: a couple 6 packs beer daily  . Drug use: Yes    Types: Marijuana    Comment: occ    Review of Systems Patient denies headaches, rhinorrhea, blurry vision, numbness, shortness of breath, chest pain, edema, cough, abdominal pain, nausea, vomiting, diarrhea, dysuria, fevers, rashes or hallucinations unless otherwise stated above in HPI. ____________________________________________   PHYSICAL EXAM:  VITAL SIGNS: Vitals:   02/15/18 1235 02/15/18 1236  BP: (!) 134/98   Pulse:  87  Resp:  16  Temp:    SpO2:  99%    Constitutional: Alert and oriented.  Eyes: Conjunctivae are normal.  Head: Atraumatic. Nose: No congestion/rhinnorhea. Mouth/Throat: Mucous membranes are moist.   Neck: No stridor. Painless ROM.  Cardiovascular: Normal rate, regular rhythm. Grossly normal heart sounds.  Good peripheral circulation. Respiratory: Normal respiratory effort.  No retractions. Lungs CTAB. Gastrointestinal: Soft and nontender. No distention. No abdominal bruits. + right CVA tenderness. Genitourinary:  Musculoskeletal: No lower extremity tenderness nor edema.  No joint effusions. Neurologic:  Normal speech and language. No gross focal neurologic deficits are appreciated. No facial droop Skin:  Skin is warm, dry and intact. No rash noted. Psychiatric: Mood and affect are normal. Speech and behavior are  normal.  ____________________________________________   LABS (all labs ordered are listed, but only abnormal results are displayed)  Results for orders placed or performed during the hospital encounter of 02/15/18 (from the past 24 hour(s))  Urinalysis, Complete w Microscopic     Status: Abnormal   Collection Time: 02/15/18 10:51 AM  Result Value Ref Range   Color, Urine COLORLESS (A) YELLOW   APPearance CLEAR (A) CLEAR   Specific Gravity, Urine 1.002 (L) 1.005 - 1.030   pH 6.0 5.0 - 8.0   Glucose, UA NEGATIVE NEGATIVE mg/dL   Hgb urine dipstick SMALL (A) NEGATIVE   Bilirubin Urine NEGATIVE NEGATIVE   Ketones, ur NEGATIVE NEGATIVE mg/dL   Protein, ur NEGATIVE NEGATIVE mg/dL   Nitrite NEGATIVE NEGATIVE   Leukocytes, UA NEGATIVE NEGATIVE  CBC with Differential/Platelet     Status: None   Collection Time: 02/15/18 10:51 AM  Result Value Ref Range   WBC 8.0 4.0 - 10.5 K/uL   RBC 4.70 4.22 - 5.81 MIL/uL  Hemoglobin 13.0 13.0 - 17.0 g/dL   HCT 40.6 39.0 - 52.0 %   MCV 86.4 80.0 - 100.0 fL   MCH 27.7 26.0 - 34.0 pg   MCHC 32.0 30.0 - 36.0 g/dL   RDW 14.1 11.5 - 15.5 %   Platelets 293 150 - 400 K/uL   nRBC 0.0 0.0 - 0.2 %   Neutrophils Relative % 56 %   Neutro Abs 4.5 1.7 - 7.7 K/uL   Lymphocytes Relative 29 %   Lymphs Abs 2.3 0.7 - 4.0 K/uL   Monocytes Relative 8 %   Monocytes Absolute 0.6 0.1 - 1.0 K/uL   Eosinophils Relative 5 %   Eosinophils Absolute 0.4 0.0 - 0.5 K/uL   Basophils Relative 1 %   Basophils Absolute 0.1 0.0 - 0.1 K/uL   Immature Granulocytes 1 %   Abs Immature Granulocytes 0.04 0.00 - 0.07 K/uL  Comprehensive metabolic panel     Status: Abnormal   Collection Time: 02/15/18 10:51 AM  Result Value Ref Range   Sodium 135 135 - 145 mmol/L   Potassium 3.5 3.5 - 5.1 mmol/L   Chloride 101 98 - 111 mmol/L   CO2 23 22 - 32 mmol/L   Glucose, Bld 108 (H) 70 - 99 mg/dL   BUN 10 6 - 20 mg/dL   Creatinine, Ser 0.95 0.61 - 1.24 mg/dL   Calcium 9.3 8.9 - 10.3  mg/dL   Total Protein 7.8 6.5 - 8.1 g/dL   Albumin 4.4 3.5 - 5.0 g/dL   AST 14 (L) 15 - 41 U/L   ALT 30 0 - 44 U/L   Alkaline Phosphatase 109 38 - 126 U/L   Total Bilirubin 0.3 0.3 - 1.2 mg/dL   GFR calc non Af Amer >60 >60 mL/min   GFR calc Af Amer >60 >60 mL/min   Anion gap 11 5 - 15  Lipase, blood     Status: Abnormal   Collection Time: 02/15/18 10:51 AM  Result Value Ref Range   Lipase 96 (H) 11 - 51 U/L   ____________________________________________ ____________________________________________  RADIOLOGY  I personally reviewed all radiographic images ordered to evaluate for the above acute complaints and reviewed radiology reports and findings.  These findings were personally discussed with the patient.  Please see medical record for radiology report.  ____________________________________________   PROCEDURES  Procedure(s) performed:  Procedures    Critical Care performed: no ____________________________________________   INITIAL IMPRESSION / ASSESSMENT AND PLAN / ED COURSE  Pertinent labs & imaging results that were available during my care of the patient were reviewed by me and considered in my medical decision making (see chart for details).   DDX: Stone, pyelonephritis, colitis, appendicitis, renal colic, musculoskeletal strain, mass  [name] is a 47 y.o. who presents to the ED with symptoms as described above.  His abdominal exam is soft and benign.  He is afebrile hemodynamically stable.  No history of stone.  Does not seem clinically consistent with acute appendicitis.  Blood work sent for the above differential does not show any evidence of renal failure or fax process.  Urinalysis does have trace hematuria therefore will order CT renal.  Clinical Course as of Feb 15 1406  Thu Feb 15, 2018  1340 Lipase, blood [PR]  1403 Pain improved.  Patient in no acute distress.  He is asking for something to eat.  Discussed results of CT imaging and need to  follow-up with urology given the abnormal result but I do  not see an indication for hospitalization at this time.  Have discussed with the patient and available family all diagnostics and treatments performed thus far and all questions were answered to the best of my ability. The patient demonstrates understanding and agreement with plan.    [PR]    Clinical Course User Index [PR] Merlyn Lot, MD     As part of my medical decision making, I reviewed the following data within the [place] notes reviewed and incorporated, Labs reviewed, notes from prior ED visits .   ____________________________________________   FINAL CLINICAL IMPRESSION(S) / ED DIAGNOSES  Final diagnoses:  Right flank pain      NEW MEDICATIONS STARTED DURING THIS VISIT:  New Prescriptions   HYDROCODONE-ACETAMINOPHEN (NORCO) 5-325 MG TABLET    Take 1 tablet by mouth every 4 (four) hours as needed for moderate pain.   TAMSULOSIN (FLOMAX) 0.4 MG CAPS CAPSULE    Take 1 capsule (0.4 mg total) by mouth daily after supper.     Note:  This document was prepared using Dragon voice recognition software and may include unintentional dictation errors.    Merlyn Lot, MD 02/15/18 [phone]

## 2018-02-15 NOTE — Discharge Instructions (Signed)

## 2018-02-19 ENCOUNTER — Ambulatory Visit: Payer: Medicare HMO | Admitting: Psychiatry

## 2018-03-05 ENCOUNTER — Ambulatory Visit: Payer: Medicare HMO | Admitting: Urology

## 2018-03-05 ENCOUNTER — Encounter: Payer: Self-pay | Admitting: Urology

## 2018-03-05 VITALS — BP 144/81 | HR 89 | Ht 73.0 in | Wt 207.8 lb

## 2018-03-05 DIAGNOSIS — N2889 Other specified disorders of kidney and ureter: Secondary | ICD-10-CM

## 2018-03-05 DIAGNOSIS — R1011 Right upper quadrant pain: Secondary | ICD-10-CM

## 2018-03-05 NOTE — Progress Notes (Signed)
03/05/2018 3:18 PM   [name] [birth year] [id number]  Referring provider: Dion Body, MD [facility] [place], [place] [address]  Chief Complaint  Patient presents with  . Hospitalization Follow-up    HPI: 47 year old male presented to the [facility] ED on 02/15/2018 with a 2-week history of right flank pain.  No prior history of stones or hematuria.  He had a stone protocol CT of the abdomen pelvis performed which did not show urinary tract calculi or hydronephrosis.  CT showed a possible mass arising from the lower pole of the right kidney versus persistent fetal lobulations or artifact.  Today he states he does not have right flank pain but complains of right upper quadrant abdominal pain.  There is no history of trauma.  He states his pain is worse after eating or drinking.  He denies bothersome lower urinary tract symptoms or gross hematuria.   PMH: Past Medical History:  Diagnosis Date  . Anxiety   . Chronic pain   . Head injury   . Hypertension   . Insomnia   . Poor historian   . Reflux   . Seizures [facility])     Surgical History: Past Surgical History:  Procedure Laterality Date  . ABDOMINAL SURGERY    . BRAIN SURGERY      Home Medications:  Allergies as of 03/05/2018      Reactions   Amitriptyline Other (See Comments)   Paralysis of pt       Medication List       Accurate as of March 05, 2018  3:18 PM. Always use your most recent med list.        albuterol 108 (90 Base) MCG/ACT inhaler Commonly known as:  PROVENTIL HFA;VENTOLIN HFA Inhale 2 puffs into the lungs every 6 (six) hours as needed for wheezing or shortness of breath.   atomoxetine 60 MG capsule Commonly known as:  STRATTERA Take 1 capsule (60 mg total) by mouth daily.   doxycycline 100 MG tablet Commonly known as:  VIBRA-TABS Take 1 tablet (100 mg total) by mouth every 12 (twelve) hours.   fluvoxaMINE 100 MG tablet Commonly known as:   LUVOX Take 1 tablet (100 mg total) by mouth at bedtime.   HYDROcodone-acetaminophen 5-325 MG tablet Commonly known as:  NORCO Take 1 tablet by mouth every 4 (four) hours as needed for moderate pain.   lamoTRIgine 200 MG tablet Commonly known as:  LAMICTAL Take 1 tablet (200 mg total) by mouth daily.   lisinopril 10 MG tablet Commonly known as:  PRINIVIL,ZESTRIL Take 10 mg by mouth at bedtime.   omeprazole 20 MG capsule Commonly known as:  PRILOSEC Take 40 mg by mouth every morning.   QUEtiapine 200 MG tablet Commonly known as:  SEROQUEL Take 1 tablet (200 mg total) by mouth at bedtime.   QUEtiapine 50 MG tablet Commonly known as:  SEROQUEL Take 1 tablet (50 mg total) by mouth daily.   senna 8.6 MG Tabs tablet Commonly known as:  SENOKOT Take 1 tablet (8.6 mg total) by mouth 2 (two) times daily.   tamsulosin 0.4 MG Caps capsule Commonly known as:  FLOMAX Take 1 capsule (0.4 mg total) by mouth daily after supper.   traZODone 150 MG tablet Commonly known as:  DESYREL Take 1 tablet (150 mg total) by mouth at bedtime as needed for sleep.       Allergies:  Allergies  Allergen Reactions  . Amitriptyline Other (See Comments)  Paralysis of pt     Family History: Family History  Problem Relation Age of Onset  . Cancer Mother   . Cancer Father   . Alcohol abuse Father   . Schizophrenia Brother     Social History:  reports that he has been smoking cigarettes. He started smoking about 27 years ago. He has been smoking about 1.00 pack per day. He quit smokeless tobacco use about 32 years ago. He reports current alcohol use of about 2.0 standard drinks of alcohol per week. He reports current drug use. Drug: Marijuana.  ROS: UROLOGY Frequent Urination?: No Hard to postpone urination?: No Burning/pain with urination?: No Get up at night to urinate?: No Leakage of urine?: No Urine stream starts and stops?: No Trouble starting stream?: No Do you have to strain to  urinate?: No Blood in urine?: No Urinary tract infection?: No Sexually transmitted disease?: No Injury to kidneys or bladder?: Yes Painful intercourse?: No Weak stream?: No Erection problems?: No Penile pain?: No  Gastrointestinal Nausea?: No Vomiting?: No Indigestion/heartburn?: No Diarrhea?: No Constipation?: Yes  Constitutional Fever: No Night sweats?: No Weight loss?: No Fatigue?: Yes  Skin Skin rash/lesions?: No Itching?: No  Eyes Blurred vision?: No Double vision?: No  Ears/Nose/Throat Sore throat?: No Sinus problems?: No  Hematologic/Lymphatic Swollen glands?: No Easy bruising?: No  Cardiovascular Leg swelling?: No Chest pain?: No  Respiratory Cough?: No Shortness of breath?: Yes  Endocrine Excessive thirst?: No  Musculoskeletal Back pain?: No Joint pain?: No  Neurological Headaches?: Yes Dizziness?: Yes  Psychologic Depression?: Yes Anxiety?: Yes  Physical Exam: BP (!) 144/81 (BP Location: Left Arm, Patient Position: Sitting, Cuff Size: Large)   Pulse 89   Ht 6\' 1"  (1.854 m)   Wt 207 lb 12.8 oz (94.3 kg)   BMI 27.42 kg/m   Constitutional:  Alert and oriented, No acute distress. HEENT: [place] AT, moist mucus membranes.  Trachea midline, no masses. Cardiovascular: No clubbing, cyanosis, or edema. Respiratory: Normal respiratory effort, no increased work of breathing. GI: Abdomen is soft, nondistended, no abdominal masses, mild right upper quadrant tenderness GU: No CVA tenderness Lymph: No cervical or inguinal lymphadenopathy. Skin: No rashes, bruises or suspicious lesions. Neurologic: Grossly intact, no focal deficits, moving all 4 extremities. Psychiatric: Normal mood and affect.  Assessment & Plan:   I discussed the noncontrast CT findings with the patient and the possibility of a right renal mass.  I recommended a CT of the abdomen with and without contrast for further evaluation.  Today he does not have right flank pain but is  complaining of right upper quadrant abdominal pain which is worse after eating or drinking.  Will schedule a limited ultrasound of the right upper quadrant.  He was informed I do not see any genitourinary abnormalities on CT that would be the cause of his pain.    Abbie Sons, [place] [address] [place], [place] [address] [phone]

## 2018-03-26 ENCOUNTER — Ambulatory Visit: Payer: Medicare HMO | Admitting: Psychiatry

## 2018-03-26 ENCOUNTER — Other Ambulatory Visit: Payer: Self-pay

## 2018-03-26 ENCOUNTER — Encounter: Payer: Self-pay | Admitting: Psychiatry

## 2018-03-26 VITALS — BP 129/75 | HR 87 | Temp 98.0°F | Wt 207.8 lb

## 2018-03-26 DIAGNOSIS — F39 Unspecified mood [affective] disorder: Secondary | ICD-10-CM | POA: Diagnosis not present

## 2018-03-26 DIAGNOSIS — F908 Attention-deficit hyperactivity disorder, other type: Secondary | ICD-10-CM

## 2018-03-26 MED ORDER — ATOMOXETINE HCL 60 MG PO CAPS: 60.0000 mg | ORAL_CAPSULE | Freq: Every day | ORAL | 2 refills | Status: DC

## 2018-03-26 MED ORDER — TRAZODONE HCL 150 MG PO TABS: 150.0000 mg | ORAL_TABLET | Freq: Every evening | ORAL | 3 refills | Status: DC | PRN

## 2018-03-26 MED ORDER — QUETIAPINE FUMARATE 200 MG PO TABS: 100.0000 mg | ORAL_TABLET | Freq: Every day | ORAL | 2 refills | Status: DC
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
## 2018-04-02 ENCOUNTER — Ambulatory Visit
Admission: RE | Admit: 2018-04-02 | Discharge: 2018-04-02 | Disposition: A | Discharge status: Home / Self Care | Payer: Medicare HMO | Source: Ambulatory Visit | Attending: Urology | Admitting: Urology

## 2018-04-02 DIAGNOSIS — R1011 Right upper quadrant pain: Secondary | ICD-10-CM | POA: Diagnosis not present

## 2018-04-02 DIAGNOSIS — K8020 Calculus of gallbladder without cholecystitis without obstruction: Secondary | ICD-10-CM | POA: Diagnosis not present

## 2018-04-03 ENCOUNTER — Other Ambulatory Visit: Payer: Self-pay | Admitting: Urology

## 2018-04-03 ENCOUNTER — Telehealth: Payer: Self-pay

## 2018-04-03 DIAGNOSIS — R1011 Right upper quadrant pain: Secondary | ICD-10-CM

## 2018-04-03 NOTE — Telephone Encounter (Signed)
Informed patient of results and recommendations. 

## 2018-04-03 NOTE — Telephone Encounter (Signed)
-----   Message from Abbie Sons, MD sent at 04/03/2018  7:12 AM EST ----- Right upper quadrant ultrasound does show a gallstone.  His PCP is at [facility] clinic.  Referral was placed to 1 of the [facility] clinic general surgeons.

## 2018-04-05 ENCOUNTER — Ambulatory Visit: Payer: Self-pay | Admitting: General Surgery

## 2018-04-05 ENCOUNTER — Ambulatory Visit
Admission: RE | Admit: 2018-04-05 | Discharge: 2018-04-05 | Disposition: A | Discharge status: Home / Self Care | Payer: Medicare HMO | Source: Ambulatory Visit | Attending: Urology | Admitting: Urology

## 2018-04-05 DIAGNOSIS — N2889 Other specified disorders of kidney and ureter: Secondary | ICD-10-CM | POA: Diagnosis not present

## 2018-04-05 DIAGNOSIS — K8020 Calculus of gallbladder without cholecystitis without obstruction: Secondary | ICD-10-CM | POA: Diagnosis not present

## 2018-04-05 DIAGNOSIS — R109 Unspecified abdominal pain: Secondary | ICD-10-CM | POA: Diagnosis not present

## 2018-04-05 LAB — POCT I-STAT CREATININE: Creatinine, Ser: 1 mg/dL (ref 0.61–1.24)

## 2018-04-05 MED ORDER — IOHEXOL 300 MG/ML  SOLN
100.0000 mL | Freq: Once | INTRAMUSCULAR | Status: AC | PRN
  Administered 2018-04-05: 100 mL via INTRAVENOUS

## 2018-04-05 NOTE — H&P (Signed)
PATIENT PROFILE: [name] is a 48 y.o. male who presents to the Clinic for consultation at the request of Dr. Rolley Sims for evaluation of cholelithiasis.  PCP:  Dion Body, MD  HISTORY OF PRESENT ILLNESS: [name] reports having right upper quadrant pain since few months ago. The patient refers that the pain start on the right upper quadrant and radiates to the back. The pain is aggravated by food intake but in can also be spontaneous. Pain improves with acetaminophen. Pain is sometimes associated with nausea. Denies vomiting. Denies fever or chills. Patient had to go to ED for this pain and was controlled with pain medications. CT scan without contrast shows a suspected renal mass. Patient was referred to Urologist and Dr. Bernardo Heater addressed that the pain was more from the right upper quadrant and ordered the abdominal ultrasound. I personally evaluated the images and identified the stone in the gallbladder without sing of cholecystitis. Patient denies chest pain or shortness of breath.    PROBLEM LIST:         Problem List  Date Reviewed: 12/18/2017         Noted   Medicare annual wellness visit, initial 12/07/17 12/07/2017   History of normocytic normochromic anemia (Hgb 12.7 - 05/30/17) 02/01/2017   Mixed hyperlipidemia (LDL 145, Trig 213 - 11/30/17) - diet controlled 08/27/2015   Borderline diabetes mellitus (A1c 6.2% - 11/30/17) - diet controlled 08/27/2015   Cognitive deficit as late effect of traumatic brain injury (CMS-HCC) 04/30/2015   Moderate episode of recurrent major depressive disorder (CMS-HCC) - followed by Dr. Gretel Acre Unknown   Anxiety, unspecified - followed by Dr. Gretel Acre Unknown   Essential hypertension Unknown   Seizures (CMS-HCC) - followed by Dr. Manuella Ghazi Unknown   Chronic insomnia - followed by Dr. Gretel Acre 01/09/2015      GENERAL REVIEW OF SYSTEMS:   General ROS: negative for - chills, fatigue, fever, weight gain or weight loss Allergy and  Immunology ROS: negative for - hives  Hematological and Lymphatic ROS: negative for - bleeding problems or bruising, negative for palpable nodes Endocrine ROS: negative for - heat or cold intolerance, hair changes Respiratory ROS: negative for - cough, shortness of breath or wheezing Cardiovascular ROS: no chest pain or palpitations GI ROS: negative for nausea, vomiting, abdominal pain, diarrhea. Positive for constipation Musculoskeletal ROS: negative for - joint swelling or muscle pain Neurological ROS: negative for - confusion, syncope. Positive for dizziness.  Dermatological ROS: negative for pruritus and rash Psychiatric: negative for anxiety, depression, Positive for difficulty sleeping and memory loss  MEDICATIONS: CurrentMedications        Current Outpatient Medications  Medication Sig Dispense Refill  . atomoxetine (STRATTERA) 60 MG capsule Take 60 mg by mouth once daily      . DOCUSATE CALCIUM (STOOL SOFTENER ORAL) Take 1 capsule by mouth 2 (two) times daily as needed.    . lamoTRIgine (LAMICTAL) 200 MG tablet Take 1 tablet (200 mg total) by mouth once daily 30 tablet 6  . lisinopril (ZESTRIL) 10 MG tablet TAKE 1 TABLET BY MOUTH ONCE DAILY 90 tablet 1  . omeprazole (PRILOSEC) 40 MG DR capsule TAKE 1 CAPSULE BY MOUTH ONCE DAILY 90 capsule 1  . QUEtiapine (SEROQUEL) 200 MG tablet Take 100 mg by mouth nightly     . traZODone (DESYREL) 150 MG tablet Take 150 mg by mouth as directed Take Trazodone 134m at bedtime + Trazodone 578mat bedtime to equal Trazodone 20035mt bedtime     . traZODone (  DESYREL) 50 MG tablet Take 50 mg by mouth as directed for Sleep Take Trazodone 155m at bedtime + Trazodone 539mat bedtime to equal Trazodone 20023mt bedtime    . VENTOLIN HFA 90 mcg/actuation inhaler INHALE 2 INHALATIONS BY MOUTH INTO THE LUNGS EVERY 4 HOURS AS NEEDED 1 Inhaler 1   No current facility-administered medications for this visit.        ALLERGIES: Amitriptyline  PAST MEDICAL HISTORY:     Past Medical History:  Diagnosis Date  . Anxiety   . Dementia (CMS-HCC)   . Depression   . GERD (gastroesophageal reflux disease)   . HTN (hypertension)   . Hyperlipidemia   . Migraine   . Poor historian   . Pulmonary emphysema (CMS-HCC)   . Pulmonary nodules   . Seizures (CMS-HCC)   . Subdural hematoma (CMS-HCC)   . Ulcer     PAST SURGICAL HISTORY:      Past Surgical History:  Procedure Laterality Date  . CRANIOPLASTY    . CRANIOTOMY       FAMILY HISTORY:      Family History  Problem Relation Age of Onset  . Cancer Mother   . High blood pressure (Hypertension) Mother      SOCIAL HISTORY: Social History          Socioeconomic History  . Marital status: Divorced    Spouse name: Not on file  . Number of children: Not on file  . Years of education: Not on file  . Highest education level: Not on file  Occupational History  . Not on file  Social Needs  . Financial resource strain: Not on file  . Food insecurity:    Worry: Not on file    Inability: Not on file  . Transportation needs:    Medical: Not on file    Non-medical: Not on file  Tobacco Use  . Smoking status: Current Every Day Smoker    Years: 30.00    Types: Cigarettes  . Smokeless tobacco: Never Used  . Tobacco comment: Smokes 2-3 cigarettes daily  Substance and Sexual Activity  . Alcohol use: Not Currently  . Drug use: Yes    Comment: Occasional MJN use  . Sexual activity: Not Currently  Other Topics Concern  . Not on file  Social History Narrative  . Not on file      PHYSICAL EXAM:    Vitals:   04/05/18 1440  BP: (!) 147/95  Pulse: 81   Body mass index is 27.43 kg/m. Weight: 94.3 kg (207 lb 14.3 oz)   GENERAL: Alert, active, oriented x3  HEENT: Pupils equal reactive to light. Extraocular movements are intact. Sclera clear. Palpebral conjunctiva normal red  color.Pharynx clear.  NECK: Supple with no palpable mass and no adenopathy.  LUNGS: Sound clear with no rales rhonchi or wheezes.  HEART: Regular rhythm S1 and S2 without murmur.  ABDOMEN: Soft and depressible, nontender with no palpable mass, no hepatomegaly.   EXTREMITIES: Well-developed well-nourished symmetrical with no dependent edema.  NEUROLOGICAL: Awake alert oriented, facial expression symmetrical, moving all extremities.  REVIEW OF DATA: I have reviewed the following data today:      No visits with results within 3 Month(s) from this visit.  Latest known visit with results is:  Appointment on 11/30/2017  Component Date Value  . Glucose 11/30/2017 91   . Sodium 11/30/2017 140   . Potassium 11/30/2017 4.4   . Chloride 11/30/2017 101   . Carbon Dioxide (CO2) 11/30/2017  27.6   . Urea Nitrogen (BUN) 11/30/2017 13   . Creatinine 11/30/2017 0.9   . Glomerular Filtration Ra* 11/30/2017 91   . Calcium 11/30/2017 9.7   . AST  11/30/2017 15   . ALT  11/30/2017 13   . Alk Phos (alkaline Phosp* 11/30/2017 88   . Albumin 11/30/2017 4.5   . Bilirubin, Total 11/30/2017 0.3   . Protein, Total 11/30/2017 7.0   . A/G Ratio 11/30/2017 1.8   . Cholesterol, Total 11/30/2017 229*  . Triglyceride 11/30/2017 213*  . HDL (High Density Lipopr* 11/30/2017 41.5   . LDL (Low Density Lipopro* 11/30/2017 145*  . VLDL Cholesterol 11/30/2017 43   . Cholesterol/HDL Ratio 11/30/2017 5.5   . Hemoglobin A1C 11/30/2017 6.2*  . Average Blood Glucose (C* 11/30/2017 131      ASSESSMENT: [name] is a 48 y.o. male presenting for consultation for cholelithiasis.    Patient was oriented about the diagnosis of cholelithiasis. Also oriented about what is the gallbladder, its anatomy and function and the implications of having stones. The patient was oriented about the treatment alternatives (observation vs cholecystectomy). Patient was oriented that a low percentage of patient will continue  to have similar pain symptoms even after the gallbladder is removed. Surgical technique (open vs laparoscopic) was discussed. It was also discussed the goals of the surgery (decrease the pain episodes and avoid the risk of cholecystitis) and the risk of surgery including: bleeding, infection, common bile duct injury, stone retention, injury to other organs such as bowel, liver, stomach, other complications such as hernia, bowel obstruction among others. Also discussed with patient about anesthesia and its complications such as: reaction to medications, pneumonia, heart complications, death, among others.  The CT scan shows that there is no mass on the kidney. The right upper quadrant pain sounds that comes from the cholelithiasis. Patient was oriented that will need to be cleared by PCP and Neurologist for his complex medical history. He is not shortness of breath and has no chest pain.   Cholelithiasis without cholecystitis [K80.20]  PLAN: 1. Laparoscopic cholecystectomy (54562) 2. CBC, CMP done on 02/15/2018 3. Internal Medicine clearance (Dr. Netty Starring) 4. Neurology clearance (Dr. Manuella Ghazi) due to seizures.  5. Avoid aspirin or any blood thinner 5 days before surgery 6. Contact us if has any question or concern.   Patient and his mother verbalized understanding, all questions were answered, and were agreeable with the plan outlined above.   Herbert Pun, MD  Electronically signed by Herbert Pun, MD

## 2018-04-05 NOTE — H&P (View-Only) (Signed)
PATIENT PROFILE: [name] is a 48 y.o. male who presents to the Clinic for consultation at the request of Dr. Rolley Sims for evaluation of cholelithiasis.  PCP:  Dion Body, MD  HISTORY OF PRESENT ILLNESS: [name] reports having right upper quadrant pain since few months ago. The patient refers that the pain start on the right upper quadrant and radiates to the back. The pain is aggravated by food intake but in can also be spontaneous. Pain improves with acetaminophen. Pain is sometimes associated with nausea. Denies vomiting. Denies fever or chills. Patient had to go to ED for this pain and was controlled with pain medications. CT scan without contrast shows a suspected renal mass. Patient was referred to Urologist and Dr. Bernardo Heater addressed that the pain was more from the right upper quadrant and ordered the abdominal ultrasound. I personally evaluated the images and identified the stone in the gallbladder without sing of cholecystitis. Patient denies chest pain or shortness of breath.    PROBLEM LIST:         Problem List  Date Reviewed: 12/18/2017         Noted   Medicare annual wellness visit, initial 12/07/17 12/07/2017   History of normocytic normochromic anemia (Hgb 12.7 - 05/30/17) 02/01/2017   Mixed hyperlipidemia (LDL 145, Trig 213 - 11/30/17) - diet controlled 08/27/2015   Borderline diabetes mellitus (A1c 6.2% - 11/30/17) - diet controlled 08/27/2015   Cognitive deficit as late effect of traumatic brain injury (CMS-HCC) 04/30/2015   Moderate episode of recurrent major depressive disorder (CMS-HCC) - followed by Dr. Gretel Acre Unknown   Anxiety, unspecified - followed by Dr. Gretel Acre Unknown   Essential hypertension Unknown   Seizures (CMS-HCC) - followed by Dr. Manuella Ghazi Unknown   Chronic insomnia - followed by Dr. Gretel Acre 01/09/2015      GENERAL REVIEW OF SYSTEMS:   General ROS: negative for - chills, fatigue, fever, weight gain or weight loss Allergy and  Immunology ROS: negative for - hives  Hematological and Lymphatic ROS: negative for - bleeding problems or bruising, negative for palpable nodes Endocrine ROS: negative for - heat or cold intolerance, hair changes Respiratory ROS: negative for - cough, shortness of breath or wheezing Cardiovascular ROS: no chest pain or palpitations GI ROS: negative for nausea, vomiting, abdominal pain, diarrhea. Positive for constipation Musculoskeletal ROS: negative for - joint swelling or muscle pain Neurological ROS: negative for - confusion, syncope. Positive for dizziness.  Dermatological ROS: negative for pruritus and rash Psychiatric: negative for anxiety, depression, Positive for difficulty sleeping and memory loss  MEDICATIONS: CurrentMedications        Current Outpatient Medications  Medication Sig Dispense Refill  . atomoxetine (STRATTERA) 60 MG capsule Take 60 mg by mouth once daily      . DOCUSATE CALCIUM (STOOL SOFTENER ORAL) Take 1 capsule by mouth 2 (two) times daily as needed.    . lamoTRIgine (LAMICTAL) 200 MG tablet Take 1 tablet (200 mg total) by mouth once daily 30 tablet 6  . lisinopril (ZESTRIL) 10 MG tablet TAKE 1 TABLET BY MOUTH ONCE DAILY 90 tablet 1  . omeprazole (PRILOSEC) 40 MG DR capsule TAKE 1 CAPSULE BY MOUTH ONCE DAILY 90 capsule 1  . QUEtiapine (SEROQUEL) 200 MG tablet Take 100 mg by mouth nightly     . traZODone (DESYREL) 150 MG tablet Take 150 mg by mouth as directed Take Trazodone 166m at bedtime + Trazodone 51mat bedtime to equal Trazodone 20065mt bedtime     . traZODone (  DESYREL) 50 MG tablet Take 50 mg by mouth as directed for Sleep Take Trazodone 150mg at bedtime + Trazodone 50mg at bedtime to equal Trazodone 200mg at bedtime    . VENTOLIN HFA 90 mcg/actuation inhaler INHALE 2 INHALATIONS BY MOUTH INTO THE LUNGS EVERY 4 HOURS AS NEEDED 1 Inhaler 1   No current facility-administered medications for this visit.        ALLERGIES: Amitriptyline  PAST MEDICAL HISTORY:     Past Medical History:  Diagnosis Date  . Anxiety   . Dementia (CMS-HCC)   . Depression   . GERD (gastroesophageal reflux disease)   . HTN (hypertension)   . Hyperlipidemia   . Migraine   . Poor historian   . Pulmonary emphysema (CMS-HCC)   . Pulmonary nodules   . Seizures (CMS-HCC)   . Subdural hematoma (CMS-HCC)   . Ulcer     PAST SURGICAL HISTORY:      Past Surgical History:  Procedure Laterality Date  . CRANIOPLASTY    . CRANIOTOMY       FAMILY HISTORY:      Family History  Problem Relation Age of Onset  . Cancer Mother   . High blood pressure (Hypertension) Mother      SOCIAL HISTORY: Social History          Socioeconomic History  . Marital status: Divorced    Spouse name: Not on file  . Number of children: Not on file  . Years of education: Not on file  . Highest education level: Not on file  Occupational History  . Not on file  Social Needs  . Financial resource strain: Not on file  . Food insecurity:    Worry: Not on file    Inability: Not on file  . Transportation needs:    Medical: Not on file    Non-medical: Not on file  Tobacco Use  . Smoking status: Current Every Day Smoker    Years: 30.00    Types: Cigarettes  . Smokeless tobacco: Never Used  . Tobacco comment: Smokes 2-3 cigarettes daily  Substance and Sexual Activity  . Alcohol use: Not Currently  . Drug use: Yes    Comment: Occasional MJ[employer] use  . Sexual activity: Not Currently  Other Topics Concern  . Not on file  Social History Narrative  . Not on file      PHYSICAL EXAM:    Vitals:   04/05/18 1440  BP: (!) 147/95  Pulse: 81   Body mass index is 27.43 kg/m. Weight: 94.3 kg (207 lb 14.3 oz)   GENERAL: Alert, active, oriented x3  HEENT: Pupils equal reactive to light. Extraocular movements are intact. Sclera clear. Palpebral conjunctiva normal red  color.Pharynx clear.  NECK: Supple with no palpable mass and no adenopathy.  LUNGS: Sound clear with no rales rhonchi or wheezes.  HEART: Regular rhythm S1 and S2 without murmur.  ABDOMEN: Soft and depressible, nontender with no palpable mass, no hepatomegaly.   EXTREMITIES: Well-developed well-nourished symmetrical with no dependent edema.  NEUROLOGICAL: Awake alert oriented, facial expression symmetrical, moving all extremities.  REVIEW OF DATA: I have reviewed the following data today:      No visits with results within 3 Month(s) from this visit.  Latest known visit with results is:  Appointment on 11/30/2017  Component Date Value  . Glucose 11/30/2017 91   . Sodium 11/30/2017 140   . Potassium 11/30/2017 4.4   . Chloride 11/30/2017 101   . Carbon Dioxide (CO2) 11/30/2017   27.6   . Urea Nitrogen (BUN) 11/30/2017 13   . Creatinine 11/30/2017 0.9   . Glomerular Filtration Ra* 11/30/2017 91   . Calcium 11/30/2017 9.7   . AST  11/30/2017 15   . ALT  11/30/2017 13   . Alk Phos (alkaline Phosp* 11/30/2017 88   . Albumin 11/30/2017 4.5   . Bilirubin, Total 11/30/2017 0.3   . Protein, Total 11/30/2017 7.0   . A/G Ratio 11/30/2017 1.8   . Cholesterol, Total 11/30/2017 229*  . Triglyceride 11/30/2017 213*  . HDL (High Density Lipopr* 11/30/2017 41.5   . LDL (Low Density Lipopro* 11/30/2017 145*  . VLDL Cholesterol 11/30/2017 43   . Cholesterol/HDL Ratio 11/30/2017 5.5   . Hemoglobin A1C 11/30/2017 6.2*  . Average Blood Glucose (C* 11/30/2017 131      ASSESSMENT: [name] is a 48 y.o. male presenting for consultation for cholelithiasis.    Patient was oriented about the diagnosis of cholelithiasis. Also oriented about what is the gallbladder, its anatomy and function and the implications of having stones. The patient was oriented about the treatment alternatives (observation vs cholecystectomy). Patient was oriented that a low percentage of patient will continue  to have similar pain symptoms even after the gallbladder is removed. Surgical technique (open vs laparoscopic) was discussed. It was also discussed the goals of the surgery (decrease the pain episodes and avoid the risk of cholecystitis) and the risk of surgery including: bleeding, infection, common bile duct injury, stone retention, injury to other organs such as bowel, liver, stomach, other complications such as hernia, bowel obstruction among others. Also discussed with patient about anesthesia and its complications such as: reaction to medications, pneumonia, heart complications, death, among others.  The CT scan shows that there is no mass on the kidney. The right upper quadrant pain sounds that comes from the cholelithiasis. Patient was oriented that will need to be cleared by PCP and Neurologist for his complex medical history. He is not shortness of breath and has no chest pain.   Cholelithiasis without cholecystitis [K80.20]  PLAN: 1. Laparoscopic cholecystectomy (06237) 2. CBC, CMP done on 02/15/2018 3. Internal Medicine clearance (Dr. Netty Starring) 4. Neurology clearance (Dr. Manuella Ghazi) due to seizures.  5. Avoid aspirin or any blood thinner 5 days before surgery 6. Contact us if has any question or concern.   Patient and his mother verbalized understanding, all questions were answered, and were agreeable with the plan outlined above.   Herbert Pun, MD  Electronically signed by Herbert Pun, MD

## 2018-04-06 ENCOUNTER — Telehealth: Payer: Self-pay | Admitting: Family Medicine

## 2018-04-06 NOTE — Telephone Encounter (Signed)
-----   Message from Abbie Sons, MD sent at 04/06/2018  8:10 AM EST ----- CT showed no evidence of renal masses or calculi

## 2018-04-06 NOTE — Telephone Encounter (Signed)
Patient notified and voiced understanding.

## 2018-04-13 ENCOUNTER — Other Ambulatory Visit: Payer: Self-pay

## 2018-04-13 ENCOUNTER — Encounter
Admission: RE | Admit: 2018-04-13 | Discharge: 2018-04-13 | Disposition: A | Discharge status: Home / Self Care | Payer: Medicare HMO | Source: Ambulatory Visit | Attending: General Surgery | Admitting: General Surgery

## 2018-04-13 DIAGNOSIS — Z01812 Encounter for preprocedural laboratory examination: Secondary | ICD-10-CM | POA: Insufficient documentation

## 2018-04-13 HISTORY — DX: Rider (driver) (passenger) of other motorcycle injured in unspecified traffic accident, initial encounter: V29.99XA

## 2018-04-13 HISTORY — DX: Major depressive disorder, single episode, unspecified: F32.9

## 2018-04-13 HISTORY — DX: Gastro-esophageal reflux disease without esophagitis: K21.9

## 2018-04-13 HISTORY — DX: Traumatic subdural hemorrhage with loss of consciousness status unknown, initial encounter: S06.5XAA

## 2018-04-13 HISTORY — DX: Traumatic subdural hemorrhage with loss of consciousness of unspecified duration, initial encounter: S06.5X9A

## 2018-04-13 HISTORY — DX: Other nonspecific abnormal finding of lung field: R91.8

## 2018-04-13 HISTORY — DX: Depression, unspecified: F32.A

## 2018-04-13 HISTORY — DX: Emphysema, unspecified: J43.9

## 2018-04-13 NOTE — Patient Instructions (Signed)
Your procedure is scheduled on: Wednesday, April 25, 2018 Report to Day Surgery on the 2nd floor of the [facility]. To find out your arrival time, please call [phone] between 1PM - 3PM on: Tuesday, February 11, 20  REMEMBER: Instructions that are not followed completely may result in serious medical risk, up to and including death; or upon the discretion of your surgeon and anesthesiologist your surgery may need to be rescheduled.  Do not eat food after midnight the night before surgery.  No gum chewing, lozengers or hard candies.  You may however, drink CLEAR liquids up to 2 hours before you are scheduled to arrive for your surgery. Do not drink anything within 2 hours of the start of your surgery.  Clear liquids include: - water  - apple juice without pulp - gatorade - black coffee or tea (Do NOT add milk or creamers to the coffee or tea) Do NOT drink anything that is not on this list.  No Alcohol for 24 hours before or after surgery.  No Smoking including e-cigarettes for 24 hours prior to surgery.  No chewable tobacco products for at least 6 hours prior to surgery.  No nicotine patches on the day of surgery.  On the morning of surgery brush your teeth with toothpaste and water, you may rinse your mouth with mouthwash if you wish. Do not swallow any toothpaste or mouthwash.  Notify your doctor if there is any change in your medical condition (cold, fever, infection).  Do not wear jewelry, make-up, hairpins, clips or nail polish.  Do not wear lotions, powders, or perfumes.   Do not shave 48 hours prior to surgery.   Contacts and dentures may not be worn into surgery.  Do not bring valuables to the hospital, including drivers license, insurance or credit cards.  [facility] is not responsible for any belongings or valuables.   TAKE THESE MEDICATIONS THE MORNING OF SURGERY:  1.  Albuterol inhaler 2.  Omeprazole - (take one the night before and one on the  morning of surgery - helps to prevent nausea after surgery.)  Use CHG Soap as directed on instruction sheet.  Use inhalers on the day of surgery and bring to the hospital.  Starting February 5 - Stop Anti-inflammatories (NSAIDS) such as Advil, Aleve, Ibuprofen, Motrin, Naproxen, Naprosyn and Aspirin based products such as Excedrin, Goodys Powder, BC Powder. (May take Tylenol or Acetaminophen if needed.)  Starting February 5 - Stop ANY OVER THE COUNTER supplements until after surgery.  Wear comfortable clothing (specific to your surgery type) to the hospital.  If you are being discharged the day of surgery, you will not be allowed to drive home. You will need a responsible adult to drive you home and stay with you that night.   If you are taking public transportation, you will need to have a responsible adult with you. Please confirm with your physician that it is acceptable to use public transportation.   Please call [phone] if you have any questions about these instructions.

## 2018-04-20 DIAGNOSIS — Z01818 Encounter for other preprocedural examination: Secondary | ICD-10-CM | POA: Diagnosis not present

## 2018-04-20 DIAGNOSIS — R7303 Prediabetes: Secondary | ICD-10-CM | POA: Diagnosis not present

## 2018-04-23 ENCOUNTER — Other Ambulatory Visit: Payer: Self-pay

## 2018-04-23 ENCOUNTER — Encounter: Payer: Self-pay | Admitting: Psychiatry

## 2018-04-23 ENCOUNTER — Ambulatory Visit: Payer: Medicare HMO | Admitting: Psychiatry

## 2018-04-23 VITALS — BP 144/90 | HR 87 | Temp 97.8°F | Wt 212.4 lb

## 2018-04-23 DIAGNOSIS — F39 Unspecified mood [affective] disorder: Secondary | ICD-10-CM | POA: Diagnosis not present

## 2018-04-23 DIAGNOSIS — F908 Attention-deficit hyperactivity disorder, other type: Secondary | ICD-10-CM

## 2018-04-23 MED ORDER — FLUVOXAMINE MALEATE 100 MG PO TABS: 100.0000 mg | ORAL_TABLET | Freq: Every day | ORAL | 1 refills | Status: DC
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
## 2018-04-24 DIAGNOSIS — K625 Hemorrhage of anus and rectum: Secondary | ICD-10-CM | POA: Insufficient documentation

## 2018-04-24 DIAGNOSIS — K219 Gastro-esophageal reflux disease without esophagitis: Secondary | ICD-10-CM | POA: Diagnosis not present

## 2018-04-24 MED ORDER — CEFAZOLIN SODIUM-DEXTROSE 2-4 GM/100ML-% IV SOLN
2.0000 g | INTRAVENOUS | Status: AC
  Administered 2018-04-25: 2 g via INTRAVENOUS

## 2018-04-25 ENCOUNTER — Encounter
Admission: RE | Disposition: A | Discharge status: Home / Self Care | Payer: Self-pay | Source: Home / Self Care | Attending: General Surgery

## 2018-04-25 ENCOUNTER — Other Ambulatory Visit: Payer: Self-pay

## 2018-04-25 ENCOUNTER — Ambulatory Visit: Payer: Medicare HMO | Admitting: Anesthesiology

## 2018-04-25 ENCOUNTER — Ambulatory Visit
Admission: RE | Admit: 2018-04-25 | Discharge: 2018-04-25 | Disposition: A | Discharge status: Home / Self Care | Payer: Medicare HMO | Attending: General Surgery | Admitting: General Surgery

## 2018-04-25 DIAGNOSIS — Z79899 Other long term (current) drug therapy: Secondary | ICD-10-CM | POA: Insufficient documentation

## 2018-04-25 DIAGNOSIS — J449 Chronic obstructive pulmonary disease, unspecified: Secondary | ICD-10-CM | POA: Diagnosis not present

## 2018-04-25 DIAGNOSIS — F17210 Nicotine dependence, cigarettes, uncomplicated: Secondary | ICD-10-CM | POA: Insufficient documentation

## 2018-04-25 DIAGNOSIS — I10 Essential (primary) hypertension: Secondary | ICD-10-CM | POA: Insufficient documentation

## 2018-04-25 DIAGNOSIS — K811 Chronic cholecystitis: Secondary | ICD-10-CM | POA: Diagnosis not present

## 2018-04-25 DIAGNOSIS — E782 Mixed hyperlipidemia: Secondary | ICD-10-CM | POA: Diagnosis not present

## 2018-04-25 DIAGNOSIS — F5104 Psychophysiologic insomnia: Secondary | ICD-10-CM | POA: Insufficient documentation

## 2018-04-25 DIAGNOSIS — J439 Emphysema, unspecified: Secondary | ICD-10-CM | POA: Insufficient documentation

## 2018-04-25 DIAGNOSIS — K219 Gastro-esophageal reflux disease without esophagitis: Secondary | ICD-10-CM | POA: Insufficient documentation

## 2018-04-25 DIAGNOSIS — R569 Unspecified convulsions: Secondary | ICD-10-CM | POA: Diagnosis not present

## 2018-04-25 DIAGNOSIS — F0390 Unspecified dementia without behavioral disturbance: Secondary | ICD-10-CM | POA: Diagnosis not present

## 2018-04-25 DIAGNOSIS — F339 Major depressive disorder, recurrent, unspecified: Secondary | ICD-10-CM | POA: Diagnosis not present

## 2018-04-25 DIAGNOSIS — K8020 Calculus of gallbladder without cholecystitis without obstruction: Secondary | ICD-10-CM | POA: Diagnosis not present

## 2018-04-25 HISTORY — PX: CHOLECYSTECTOMY: SHX55

## 2018-04-25 LAB — URINE DRUG SCREEN, QUALITATIVE (ARMC ONLY)
Amphetamines, Ur Screen: NOT DETECTED
BARBITURATES, UR SCREEN: NOT DETECTED
Benzodiazepine, Ur Scrn: NOT DETECTED
Cannabinoid 50 Ng, Ur ~~LOC~~: POSITIVE — AB
Cocaine Metabolite,Ur ~~LOC~~: NOT DETECTED
MDMA (Ecstasy)Ur Screen: NOT DETECTED
Methadone Scn, Ur: NOT DETECTED
OPIATE, UR SCREEN: NOT DETECTED
Phencyclidine (PCP) Ur S: NOT DETECTED
Tricyclic, Ur Screen: POSITIVE — AB

## 2018-04-25 SURGERY — LAPAROSCOPIC CHOLECYSTECTOMY
Anesthesia: General

## 2018-04-25 MED ORDER — FAMOTIDINE 20 MG PO TABS
ORAL_TABLET | ORAL | Status: AC
  Administered 2018-04-25: 20 mg via ORAL
  Filled 2018-04-25: qty 1

## 2018-04-25 MED ORDER — OXYCODONE HCL 5 MG PO TABS: 5.0000 mg | ORAL_TABLET | Freq: Once | ORAL | Status: DC | PRN

## 2018-04-25 MED ORDER — FENTANYL CITRATE (PF) 250 MCG/5ML IJ SOLN
INTRAMUSCULAR | Status: AC
  Filled 2018-04-25: qty 5

## 2018-04-25 MED ORDER — DEXMEDETOMIDINE HCL IN NACL 200 MCG/50ML IV SOLN
INTRAVENOUS | Status: AC
  Filled 2018-04-25: qty 50

## 2018-04-25 MED ORDER — HYDROCODONE-ACETAMINOPHEN 5-325 MG PO TABS
ORAL_TABLET | ORAL | Status: AC
  Filled 2018-04-25: qty 1

## 2018-04-25 MED ORDER — EPHEDRINE SULFATE 50 MG/ML IJ SOLN
INTRAMUSCULAR | Status: DC | PRN
  Administered 2018-04-25: 10 mg via INTRAVENOUS

## 2018-04-25 MED ORDER — LIDOCAINE HCL (PF) 2 % IJ SOLN
INTRAMUSCULAR | Status: AC
  Filled 2018-04-25: qty 10

## 2018-04-25 MED ORDER — OXYCODONE HCL 5 MG/5ML PO SOLN: 5.0000 mg | Freq: Once | ORAL | Status: DC | PRN

## 2018-04-25 MED ORDER — EPINEPHRINE PF 1 MG/ML IJ SOLN
INTRAMUSCULAR | Status: AC
  Filled 2018-04-25: qty 1

## 2018-04-25 MED ORDER — DEXMEDETOMIDINE HCL 200 MCG/2ML IV SOLN
INTRAVENOUS | Status: DC | PRN
  Administered 2018-04-25: 8 ug via INTRAVENOUS

## 2018-04-25 MED ORDER — SUGAMMADEX SODIUM 200 MG/2ML IV SOLN
INTRAVENOUS | Status: DC | PRN
  Administered 2018-04-25: 200 mg via INTRAVENOUS

## 2018-04-25 MED ORDER — MIDAZOLAM HCL 2 MG/2ML IJ SOLN
INTRAMUSCULAR | Status: AC
  Filled 2018-04-25: qty 2

## 2018-04-25 MED ORDER — ROCURONIUM BROMIDE 50 MG/5ML IV SOLN
INTRAVENOUS | Status: AC
  Filled 2018-04-25: qty 1

## 2018-04-25 MED ORDER — SUCCINYLCHOLINE CHLORIDE 20 MG/ML IJ SOLN
INTRAMUSCULAR | Status: AC
  Filled 2018-04-25: qty 1

## 2018-04-25 MED ORDER — PHENYLEPHRINE HCL 10 MG/ML IJ SOLN
INTRAMUSCULAR | Status: AC
  Filled 2018-04-25: qty 1

## 2018-04-25 MED ORDER — HYDROCODONE-ACETAMINOPHEN 5-325 MG PO TABS
1.0000 | ORAL_TABLET | Freq: Four times a day (QID) | ORAL | Status: DC | PRN
  Administered 2018-04-25: 1 via ORAL

## 2018-04-25 MED ORDER — DEXAMETHASONE SODIUM PHOSPHATE 10 MG/ML IJ SOLN
INTRAMUSCULAR | Status: AC
  Filled 2018-04-25: qty 1

## 2018-04-25 MED ORDER — PHENYLEPHRINE HCL 10 MG/ML IJ SOLN
INTRAMUSCULAR | Status: DC | PRN
  Administered 2018-04-25: 200 ug via INTRAVENOUS
  Administered 2018-04-25: 100 ug via INTRAVENOUS
  Administered 2018-04-25: 200 ug via INTRAVENOUS
  Administered 2018-04-25 (×2): 100 ug via INTRAVENOUS

## 2018-04-25 MED ORDER — DEXAMETHASONE SODIUM PHOSPHATE 10 MG/ML IJ SOLN
INTRAMUSCULAR | Status: DC | PRN
  Administered 2018-04-25: 10 mg via INTRAVENOUS

## 2018-04-25 MED ORDER — PROPOFOL 10 MG/ML IV BOLUS
INTRAVENOUS | Status: AC
  Filled 2018-04-25: qty 40

## 2018-04-25 MED ORDER — LACTATED RINGERS IV SOLN
INTRAVENOUS | Status: DC
  Administered 2018-04-25: 06:00:00 via INTRAVENOUS

## 2018-04-25 MED ORDER — BUPIVACAINE HCL (PF) 0.5 % IJ SOLN
INTRAMUSCULAR | Status: AC
  Filled 2018-04-25: qty 30

## 2018-04-25 MED ORDER — MIDAZOLAM HCL 2 MG/2ML IJ SOLN
INTRAMUSCULAR | Status: DC | PRN
  Administered 2018-04-25: 2 mg via INTRAVENOUS

## 2018-04-25 MED ORDER — EPHEDRINE SULFATE 50 MG/ML IJ SOLN
INTRAMUSCULAR | Status: AC
  Filled 2018-04-25: qty 1

## 2018-04-25 MED ORDER — LIDOCAINE HCL (CARDIAC) PF 100 MG/5ML IV SOSY
PREFILLED_SYRINGE | INTRAVENOUS | Status: DC | PRN
  Administered 2018-04-25: 100 mg via INTRAVENOUS

## 2018-04-25 MED ORDER — SUGAMMADEX SODIUM 200 MG/2ML IV SOLN
INTRAVENOUS | Status: AC
  Filled 2018-04-25: qty 2

## 2018-04-25 MED ORDER — ROCURONIUM BROMIDE 100 MG/10ML IV SOLN
INTRAVENOUS | Status: DC | PRN
  Administered 2018-04-25: 50 mg via INTRAVENOUS

## 2018-04-25 MED ORDER — FENTANYL CITRATE (PF) 100 MCG/2ML IJ SOLN: 25.0000 ug | INTRAMUSCULAR | Status: DC | PRN

## 2018-04-25 MED ORDER — CEFAZOLIN SODIUM-DEXTROSE 2-4 GM/100ML-% IV SOLN
INTRAVENOUS | Status: AC
  Filled 2018-04-25: qty 100

## 2018-04-25 MED ORDER — BUPIVACAINE-EPINEPHRINE (PF) 0.5% -1:200000 IJ SOLN
INTRAMUSCULAR | Status: DC | PRN
  Administered 2018-04-25: 20 mL

## 2018-04-25 MED ORDER — SODIUM CHLORIDE 0.9 % IV SOLN
INTRAVENOUS | Status: DC | PRN
  Administered 2018-04-25: 20 ug/min via INTRAVENOUS

## 2018-04-25 MED ORDER — ONDANSETRON HCL 4 MG/2ML IJ SOLN
INTRAMUSCULAR | Status: DC | PRN
  Administered 2018-04-25: 4 mg via INTRAVENOUS

## 2018-04-25 MED ORDER — ONDANSETRON HCL 4 MG/2ML IJ SOLN
INTRAMUSCULAR | Status: AC
  Filled 2018-04-25: qty 2

## 2018-04-25 MED ORDER — HYDROCODONE-ACETAMINOPHEN 5-325 MG PO TABS: 1.0000 | ORAL_TABLET | ORAL | 0 refills | Status: DC | PRN

## 2018-04-25 MED ORDER — PROPOFOL 10 MG/ML IV BOLUS
INTRAVENOUS | Status: DC | PRN
  Administered 2018-04-25: 180 mg via INTRAVENOUS

## 2018-04-25 MED ORDER — FAMOTIDINE 20 MG PO TABS
20.0000 mg | ORAL_TABLET | Freq: Once | ORAL | Status: AC
  Administered 2018-04-25: 20 mg via ORAL

## 2018-04-25 MED ORDER — FENTANYL CITRATE (PF) 100 MCG/2ML IJ SOLN
INTRAMUSCULAR | Status: DC | PRN
  Administered 2018-04-25 (×5): 50 ug via INTRAVENOUS

## 2018-04-25 MED ORDER — SUCCINYLCHOLINE CHLORIDE 20 MG/ML IJ SOLN
INTRAMUSCULAR | Status: DC | PRN
  Administered 2018-04-25: 200 mg via INTRAVENOUS

## 2018-04-25 SURGICAL SUPPLY — 41 items
ADH SKN CLS APL DERMABOND .7 (GAUZE/BANDAGES/DRESSINGS) ×1
APPLIER CLIP 5 13 M/L LIGAMAX5 (MISCELLANEOUS) ×3
APR CLP MED LRG 5 ANG JAW (MISCELLANEOUS) ×1
BAG SPEC RTRVL LRG 6X4 10 (ENDOMECHANICALS) ×1
BLADE SURG SZ11 CARB STEEL (BLADE) ×3 IMPLANT
CANISTER SUCT 1200ML W/VALVE (MISCELLANEOUS) ×3 IMPLANT
CATH CHOLANG 76X19 KUMAR (CATHETERS) ×3 IMPLANT
CHLORAPREP W/TINT 26ML (MISCELLANEOUS) ×3 IMPLANT
CLIP APPLIE 5 13 M/L LIGAMAX5 (MISCELLANEOUS) ×1 IMPLANT
COVER WAND RF STERILE (DRAPES) ×3 IMPLANT
DERMABOND ADVANCED (GAUZE/BANDAGES/DRESSINGS) ×2
DERMABOND ADVANCED .7 DNX12 (GAUZE/BANDAGES/DRESSINGS) ×1 IMPLANT
ELECT REM PT RETURN 9FT ADLT (ELECTROSURGICAL) ×3
ELECTRODE REM PT RTRN 9FT ADLT (ELECTROSURGICAL) ×1 IMPLANT
GLOVE BIO SURGEON STRL SZ 6.5 (GLOVE) ×2 IMPLANT
GLOVE BIO SURGEONS STRL SZ 6.5 (GLOVE) ×1
GOWN STRL REUS W/ TWL LRG LVL3 (GOWN DISPOSABLE) ×4 IMPLANT
GOWN STRL REUS W/TWL LRG LVL3 (GOWN DISPOSABLE) ×12
GRASPER SUT TROCAR 14GX15 (MISCELLANEOUS) IMPLANT
HEMOSTAT SURGICEL 2X3 (HEMOSTASIS) IMPLANT
IRRIGATION STRYKERFLOW (MISCELLANEOUS) ×1 IMPLANT
IRRIGATOR STRYKERFLOW (MISCELLANEOUS) ×3
IV NS 1000ML (IV SOLUTION) ×3
IV NS 1000ML BAXH (IV SOLUTION) ×1 IMPLANT
KIT TURNOVER KIT A (KITS) ×3 IMPLANT
LABEL OR SOLS (LABEL) ×3 IMPLANT
NDL HYPO 25X1 1.5 SAFETY (NEEDLE) ×1 IMPLANT
NDL INSUFFLATION 14GA 120MM (NEEDLE) ×1 IMPLANT
NEEDLE HYPO 25X1 1.5 SAFETY (NEEDLE) ×3 IMPLANT
NEEDLE INSUFFLATION 14GA 120MM (NEEDLE) ×3 IMPLANT
NS IRRIG 500ML POUR BTL (IV SOLUTION) ×3 IMPLANT
PACK LAP CHOLECYSTECTOMY (MISCELLANEOUS) ×3 IMPLANT
POUCH SPECIMEN RETRIEVAL 10MM (ENDOMECHANICALS) ×3 IMPLANT
SCISSORS METZENBAUM CVD 33 (INSTRUMENTS) ×3 IMPLANT
SET TUBE SMOKE EVAC HIGH FLOW (TUBING) ×3 IMPLANT
SLEEVE ENDOPATH XCEL 5M (ENDOMECHANICALS) ×6 IMPLANT
SUT MNCRL AB 4-0 PS2 18 (SUTURE) ×3 IMPLANT
SUT VIC AB 0 CT1 36 (SUTURE) IMPLANT
SUT VICRYL 0 AB UR-6 (SUTURE) ×3 IMPLANT
TROCAR XCEL NON-BLD 11X100MML (ENDOMECHANICALS) ×3 IMPLANT
TROCAR XCEL NON-BLD 5MMX100MML (ENDOMECHANICALS) ×3 IMPLANT

## 2018-04-25 NOTE — Anesthesia Postprocedure Evaluation (Signed)
Anesthesia Post Note  Patient: [name]  Procedure(s) Performed: LAPAROSCOPIC CHOLECYSTECTOMY (N/A )  Patient location during evaluation: PACU Anesthesia Type: General Level of consciousness: awake and alert Pain management: pain level controlled Vital Signs Assessment: post-procedure vital signs reviewed and stable Respiratory status: spontaneous breathing, nonlabored ventilation, respiratory function stable and patient connected to nasal cannula oxygen Cardiovascular status: blood pressure returned to baseline and stable Postop Assessment: no apparent nausea or vomiting Anesthetic complications: no     Last Vitals:  Vitals:   04/25/18 0929 04/25/18 0950  BP: 135/83 131/78  Pulse: 79 87  Resp: 18 18  Temp: 36.7 C 36.8 C  SpO2: 97% 100%    Last Pain:  Vitals:   04/25/18 0950  TempSrc: Temporal  PainSc: 3                  Precious Haws Tarick Parenteau

## 2018-04-25 NOTE — Interval H&P Note (Signed)
History and Physical Interval Note:  04/25/2018 6:53 AM  [name]  has presented today for surgery, with the diagnosis of CHOLELITHIASIS WITHOUT CHOLECYSTITIS  The various methods of treatment have been discussed with the patient and family. After consideration of risks, benefits and other options for treatment, the patient has consented to  Procedure(s): LAPAROSCOPIC CHOLECYSTECTOMY (N/A) as a surgical intervention .  The patient's history has been reviewed, patient examined, no change in status, stable for surgery.  I have reviewed the patient's chart and labs.  Questions were answered to the patient's satisfaction.     Herbert Pun

## 2018-04-25 NOTE — Anesthesia Procedure Notes (Signed)
Procedure Name: Intubation Date/Time: 04/25/2018 7:41 AM Performed by: Lavone Orn, CRNA Pre-anesthesia Checklist: Patient identified, Emergency Drugs available, Suction available, Patient being monitored and Timeout performed Patient Re-evaluated:Patient Re-evaluated prior to induction Oxygen Delivery Method: Circle system utilized Preoxygenation: Pre-oxygenation with 100% oxygen Induction Type: IV induction Ventilation: Oral airway inserted - appropriate to patient size and Mask ventilation without difficulty Laryngoscope Size: Mac and 4 Grade View: Grade II Tube type: Oral Tube size: 7.5 mm Number of attempts: 1 Airway Equipment and Method: Stylet Placement Confirmation: ETT inserted through vocal cords under direct vision,  positive ETCO2 and breath sounds checked- equal and bilateral Secured at: 22 cm Tube secured with: Tape Dental Injury: Teeth and Oropharynx as per pre-operative assessment
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
## 2018-04-25 NOTE — Addendum Note (Signed)
Addendum  created 04/25/18 1040 by Lavone Orn, CRNA   Intraprocedure Flowsheets edited

## 2018-04-25 NOTE — Discharge Instructions (Signed)
  Diet: Resume home heart healthy regular diet.   Activity: No heavy lifting >20 pounds (children, pets, laundry, garbage) or strenuous activity until follow-up, but light activity and walking are encouraged. Do not drive or drink alcohol if taking narcotic pain medications.  Wound care: May shower with soapy water and pat dry (do not rub incisions), but no baths or submerging incision underwater until follow-up. (no swimming)   Medications: Resume all home medications. For mild to moderate pain: acetaminophen (Tylenol) or ibuprofen (if no kidney disease). Combining Tylenol with alcohol can substantially increase your risk of causing liver disease. Narcotic pain medications, if prescribed, can be used for severe pain, though may cause nausea, constipation, and drowsiness. Do not combine Tylenol and Norco within a 6 hour period as Norco contains Tylenol. If you do not need the narcotic pain medication, you do not need to fill the prescription.  Call office ([phone]) at any time if any questions, worsening pain, fevers/chills, bleeding, drainage from incision site, or other concerns.   AMBULATORY SURGERY  DISCHARGE INSTRUCTIONS   1) The drugs that you were given will stay in your system until tomorrow so for the next 24 hours you should not:  A) Drive an automobile B) Make any legal decisions C) Drink any alcoholic beverage   2) You may resume regular meals tomorrow.  Today it is better to start with liquids and gradually work up to solid foods.  You may eat anything you prefer, but it is better to start with liquids, then soup and crackers, and gradually work up to solid foods.   3) Please notify your doctor immediately if you have any unusual bleeding, trouble breathing, redness and pain at the surgery site, drainage, fever, or pain not relieved by medication.    4) Additional Instructions:        Please contact your physician with any problems or Same Day Surgery at  [phone], Monday through Friday 6 am to 4 pm, or [place] [facility] [place] Main number at [phone]. 

## 2018-04-25 NOTE — Anesthesia Post-op Follow-up Note (Signed)
Anesthesia QCDR form completed.        

## 2018-04-25 NOTE — Transfer of Care (Signed)
Immediate Anesthesia Transfer of Care Note  Patient: [name]  Procedure(s) Performed: LAPAROSCOPIC CHOLECYSTECTOMY (N/A )  Patient Location: PACU  Anesthesia Type:General  Level of Consciousness: awake and drowsy  Airway & Oxygen Therapy: Patient Spontanous Breathing and Patient connected to face mask oxygen  Post-op Assessment: Report given to RN and Post -op Vital signs reviewed and stable  Post vital signs: stable  Last Vitals:  Vitals Value Taken Time  BP 147/87 04/25/2018  8:47 AM  Temp    Pulse 88 04/25/2018  8:51 AM  Resp 18 04/25/2018  8:51 AM  SpO2 100 % 04/25/2018  8:51 AM  Vitals shown include unvalidated device data.  Last Pain:  Vitals:   04/25/18 0601  TempSrc: Temporal  PainSc: 0-No pain         Complications: No apparent anesthesia complications

## 2018-04-25 NOTE — Op Note (Signed)
Preoperative diagnosis: Symptomatic cholelithiasis.  Postoperative diagnosis: Symptomatic cholelithiasis.  Procedure: Laparoscopic Cholecystectomy.   Anesthesia: GETA   Surgeon: Dr. Windell Moment  Wound Classification: Clean Contaminated  Indications: Patient is a 48 y.o. male developed right upper quadrant pain and on workup was found to have cholelithiasis with a normal common duct. Laparoscopic cholecystectomy was elected.  Findings: Critical view of safety achieved Cystic duct and artery identified, ligated and divided Adequate hemostasis  Description of procedure: The patient was placed on the operating table in the supine position. General anesthesia was induced. A time-out was completed verifying correct patient, procedure, site, positioning, and implant(s) and/or special equipment prior to beginning this procedure. An orogastric tube was placed. The abdomen was prepped and draped in the usual sterile fashion.  An incision was made in a natural skin line above the umbilicus.  The fascia was elevated and the Veress needle inserted. Proper position was confirmed by aspiration and saline meniscus test.  The abdomen was insufflated with carbon dioxide to a pressure of 15 mmHg. The patient tolerated insufflation well. A 11-mm trocar was then inserted.  The laparoscope was inserted and the abdomen inspected. No injuries from initial trocar placement were noted. Additional trocars were then inserted in the following locations: a 5-mm trocar in the right epigastrium and two 5-mm trocars along the right costal margin. The abdomen was inspected and no abnormalities were found. The table was placed in the reverse Trendelenburg position with the right side up.  Filmy adhesions between the gallbladder and omentum, duodenum and transverse colon were lysed sharply. The dome of the gallbladder was grasped with an atraumatic grasper passed through the lateral port and retracted over the dome of the  liver. The infundibulum was also grasped with an atraumatic grasper through the midclavicular port and retracted toward the right lower quadrant. This maneuver exposed Calot's triangle. The peritoneum with abundant fat tissue overlying the gallbladder infundibulum was then incised and the cystic duct and cystic artery identified and circumferentially dissected. Critical view of safety reviewed before ligating any structure. The cystic duct and cystic artery were then doubly clipped and divided close to the gallbladder.  The gallbladder was then dissected from its peritoneal attachments by electrocautery. Hemostasis was checked and the gallbladder and contained stones were removed using an endoscopic retrieval bag placed through the umbilical port. The gallbladder was passed off the table as a specimen. The gallbladder fossa was copiously irrigated with saline and hemostasis was obtained. There was no evidence of bleeding from the gallbladder fossa or cystic artery or leakage of the bile from the cystic duct stump. Secondary trocars were removed under direct vision. No bleeding was noted. The laparoscope was withdrawn and the umbilical trocar removed. The abdomen was allowed to collapse. The fascia of the 39mm trocar sites was closed with figure-of-eight 0 vicryl sutures. The skin was closed with subcuticular sutures of 4-0 monocryl and topical skin adhesive. The orogastric tube was removed.  The patient tolerated the procedure well and was taken to the postanesthesia care unit in stable condition.   Specimen: Gallbladder  Complications: None  EBL: 5 mL

## 2018-04-26 ENCOUNTER — Observation Stay (HOSPITAL_COMMUNITY)
Admission: EM | Admit: 2018-04-26 | Discharge: 2018-04-27 | Disposition: A | Discharge status: Home / Self Care | Payer: Medicare HMO | Attending: Internal Medicine | Admitting: Internal Medicine

## 2018-04-26 ENCOUNTER — Encounter (HOSPITAL_COMMUNITY): Payer: Self-pay | Admitting: Emergency Medicine

## 2018-04-26 ENCOUNTER — Emergency Department (HOSPITAL_COMMUNITY): Payer: Medicare HMO

## 2018-04-26 ENCOUNTER — Other Ambulatory Visit: Payer: Self-pay

## 2018-04-26 DIAGNOSIS — Z9049 Acquired absence of other specified parts of digestive tract: Secondary | ICD-10-CM

## 2018-04-26 DIAGNOSIS — F17290 Nicotine dependence, other tobacco product, uncomplicated: Secondary | ICD-10-CM | POA: Insufficient documentation

## 2018-04-26 DIAGNOSIS — G8918 Other acute postprocedural pain: Secondary | ICD-10-CM | POA: Diagnosis not present

## 2018-04-26 DIAGNOSIS — F319 Bipolar disorder, unspecified: Secondary | ICD-10-CM | POA: Insufficient documentation

## 2018-04-26 DIAGNOSIS — F315 Bipolar disorder, current episode depressed, severe, with psychotic features: Secondary | ICD-10-CM | POA: Diagnosis not present

## 2018-04-26 DIAGNOSIS — D72829 Elevated white blood cell count, unspecified: Secondary | ICD-10-CM | POA: Insufficient documentation

## 2018-04-26 DIAGNOSIS — I10 Essential (primary) hypertension: Secondary | ICD-10-CM | POA: Insufficient documentation

## 2018-04-26 DIAGNOSIS — R109 Unspecified abdominal pain: Secondary | ICD-10-CM | POA: Diagnosis present

## 2018-04-26 DIAGNOSIS — Z79899 Other long term (current) drug therapy: Secondary | ICD-10-CM | POA: Insufficient documentation

## 2018-04-26 DIAGNOSIS — R1011 Right upper quadrant pain: Secondary | ICD-10-CM | POA: Diagnosis not present

## 2018-04-26 DIAGNOSIS — R52 Pain, unspecified: Secondary | ICD-10-CM | POA: Diagnosis not present

## 2018-04-26 DIAGNOSIS — R1084 Generalized abdominal pain: Secondary | ICD-10-CM | POA: Diagnosis not present

## 2018-04-26 LAB — COMPREHENSIVE METABOLIC PANEL
ALT: 31 U/L (ref 0–44)
ANION GAP: 10 (ref 5–15)
AST: 27 U/L (ref 15–41)
Albumin: 4.4 g/dL (ref 3.5–5.0)
Alkaline Phosphatase: 76 U/L (ref 38–126)
BUN: 13 mg/dL (ref 6–20)
CO2: 28 mmol/L (ref 22–32)
Calcium: 8.9 mg/dL (ref 8.9–10.3)
Chloride: 97 mmol/L — ABNORMAL LOW (ref 98–111)
Creatinine, Ser: 0.97 mg/dL (ref 0.61–1.24)
GFR calc non Af Amer: >60 mL/min (ref >60)
Glucose, Bld: 111 mg/dL — ABNORMAL HIGH (ref 70–99)
Potassium: 3.7 mmol/L (ref 3.5–5.1)
Sodium: 135 mmol/L (ref 135–145)
Total Bilirubin: 0.6 mg/dL (ref 0.3–1.2)
Total Protein: 8 g/dL (ref 6.5–8.1)

## 2018-04-26 LAB — CBC WITH DIFFERENTIAL/PLATELET
Abs Immature Granulocytes: 0.09 10*3/uL — ABNORMAL HIGH (ref 0.00–0.07)
Basophils Absolute: 0 10*3/uL (ref 0.0–0.1)
Basophils Relative: 0 %
EOS ABS: 0.1 10*3/uL (ref 0.0–0.5)
Eosinophils Relative: 1 %
HCT: 39.5 % (ref 39.0–52.0)
Hemoglobin: 12.1 g/dL — ABNORMAL LOW (ref 13.0–17.0)
Immature Granulocytes: 1 %
Lymphocytes Relative: 9 %
Lymphs Abs: 1.6 10*3/uL (ref 0.7–4.0)
MCH: 27.3 pg (ref 26.0–34.0)
MCHC: 30.6 g/dL (ref 30.0–36.0)
MCV: 89.2 fL (ref 80.0–100.0)
Monocytes Absolute: 1.2 10*3/uL — ABNORMAL HIGH (ref 0.1–1.0)
Monocytes Relative: 7 %
Neutro Abs: 14.6 10*3/uL — ABNORMAL HIGH (ref 1.7–7.7)
Neutrophils Relative %: 82 %
Platelets: 283 10*3/uL (ref 150–400)
RBC: 4.43 MIL/uL (ref 4.22–5.81)
RDW: 14 % (ref 11.5–15.5)
WBC: 17.6 10*3/uL — AB (ref 4.0–10.5)
nRBC: 0 % (ref 0.0–0.2)

## 2018-04-26 LAB — SURGICAL PATHOLOGY

## 2018-04-26 LAB — LIPASE, BLOOD: Lipase: 49 U/L (ref 11–51)

## 2018-04-26 MED ORDER — MORPHINE SULFATE (PF) 2 MG/ML IV SOLN: 2.0000 mg | INTRAVENOUS | Status: DC | PRN

## 2018-04-26 MED ORDER — IOHEXOL 300 MG/ML  SOLN
100.0000 mL | Freq: Once | INTRAMUSCULAR | Status: AC | PRN
  Administered 2018-04-26: 100 mL via INTRAVENOUS

## 2018-04-26 MED ORDER — HYDRALAZINE HCL 20 MG/ML IJ SOLN: 10.0000 mg | INTRAMUSCULAR | Status: DC | PRN

## 2018-04-26 MED ORDER — SODIUM CHLORIDE 0.9 % IV SOLN
INTRAVENOUS | Status: AC
  Administered 2018-04-26: 23:00:00 via INTRAVENOUS

## 2018-04-26 MED ORDER — ONDANSETRON HCL 4 MG/2ML IJ SOLN: 4.0000 mg | Freq: Four times a day (QID) | INTRAMUSCULAR | Status: DC | PRN

## 2018-04-26 MED ORDER — HYDROMORPHONE HCL 1 MG/ML IJ SOLN
0.5000 mg | INTRAMUSCULAR | Status: DC | PRN
  Administered 2018-04-26 – 2018-04-27 (×2): 1 mg via INTRAVENOUS
  Filled 2018-04-26 (×2): qty 1

## 2018-04-26 MED ORDER — ONDANSETRON HCL 4 MG/2ML IJ SOLN
4.0000 mg | Freq: Once | INTRAMUSCULAR | Status: AC
  Administered 2018-04-26: 4 mg via INTRAVENOUS
  Filled 2018-04-26: qty 2

## 2018-04-26 MED ORDER — ALBUTEROL SULFATE (2.5 MG/3ML) 0.083% IN NEBU: 3.0000 mL | INHALATION_SOLUTION | Freq: Four times a day (QID) | RESPIRATORY_TRACT | Status: DC | PRN

## 2018-04-26 MED ORDER — ACETAMINOPHEN 325 MG PO TABS: 650.0000 mg | ORAL_TABLET | Freq: Four times a day (QID) | ORAL | Status: DC | PRN

## 2018-04-26 MED ORDER — PROMETHAZINE HCL 25 MG/ML IJ SOLN: 12.5000 mg | Freq: Four times a day (QID) | INTRAMUSCULAR | Status: DC | PRN

## 2018-04-26 MED ORDER — ONDANSETRON HCL 4 MG PO TABS: 4.0000 mg | ORAL_TABLET | Freq: Four times a day (QID) | ORAL | Status: DC | PRN

## 2018-04-26 MED ORDER — MORPHINE SULFATE (PF) 4 MG/ML IV SOLN
4.0000 mg | Freq: Once | INTRAVENOUS | Status: AC
  Administered 2018-04-26: 4 mg via INTRAVENOUS
  Filled 2018-04-26: qty 1

## 2018-04-26 MED ORDER — MORPHINE SULFATE (PF) 4 MG/ML IV SOLN: 4.0000 mg | Freq: Once | INTRAVENOUS | Status: DC

## 2018-04-26 MED ORDER — ACETAMINOPHEN 650 MG RE SUPP: 650.0000 mg | Freq: Four times a day (QID) | RECTAL | Status: DC | PRN

## 2018-04-26 MED ORDER — MORPHINE SULFATE (PF) 4 MG/ML IV SOLN
4.0000 mg | Freq: Once | INTRAVENOUS | Status: AC
  Administered 2018-04-26: 4 mg via INTRAVENOUS

## 2018-04-26 NOTE — ED Notes (Signed)
Patient standing in room.

## 2018-04-26 NOTE — ED Notes (Signed)
Pt back from ct

## 2018-04-26 NOTE — ED Notes (Signed)
Pt and family notified edp will be in room shortly

## 2018-04-26 NOTE — Progress Notes (Signed)
Called by ED. Patient POD 1 from lap chole in [place], patient with abd pain and wbc to 17. Normal LFTs. CT without oral contrast. Likely just post op pain and post Op leukocytes as he has normal vitals and exam is reassuring.  Recommend admission overnight to Hospitalist. Keep NPO. Do not start antibiotics. Repeat CBC with diff and LFTs in AM.  Will assess in am and assess labs.  Hopefully leukocytosis will trend down and pain will  be better controlled.  If not will decide about further imaging.   Curlene Labrum, MD

## 2018-04-26 NOTE — ED Triage Notes (Signed)
Patient had gallbladder surgery yesterday. Reports onset of acute pain today at 1600. Brought in via EMS.

## 2018-04-26 NOTE — ED Notes (Signed)
  Pt transported to ct 

## 2018-04-26 NOTE — ED Notes (Signed)
EDP to room

## 2018-04-26 NOTE — ED Notes (Signed)
Pt requesting cup of water, rn advised to wait until ct resulted

## 2018-04-26 NOTE — ED Notes (Signed)
Pt given warm blankets.

## 2018-04-26 NOTE — ED Notes (Signed)
Pt requesting to see edp. edp notified of the same
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
## 2018-04-27 ENCOUNTER — Other Ambulatory Visit: Payer: Self-pay

## 2018-04-27 DIAGNOSIS — R109 Unspecified abdominal pain: Secondary | ICD-10-CM

## 2018-04-27 DIAGNOSIS — G8918 Other acute postprocedural pain: Secondary | ICD-10-CM

## 2018-04-27 DIAGNOSIS — D72823 Leukemoid reaction: Secondary | ICD-10-CM | POA: Diagnosis not present

## 2018-04-27 DIAGNOSIS — Z9049 Acquired absence of other specified parts of digestive tract: Secondary | ICD-10-CM

## 2018-04-27 DIAGNOSIS — F315 Bipolar disorder, current episode depressed, severe, with psychotic features: Secondary | ICD-10-CM | POA: Diagnosis not present

## 2018-04-27 LAB — CBC WITH DIFFERENTIAL/PLATELET
ABS IMMATURE GRANULOCYTES: 0.05 10*3/uL (ref 0.00–0.07)
Basophils Absolute: 0 10*3/uL (ref 0.0–0.1)
Basophils Relative: 0 %
Eosinophils Absolute: 0.1 10*3/uL (ref 0.0–0.5)
Eosinophils Relative: 1 %
HCT: 43.3 % (ref 39.0–52.0)
Hemoglobin: 13.6 g/dL (ref 13.0–17.0)
Immature Granulocytes: 0 %
LYMPHS PCT: 16 %
Lymphs Abs: 2.4 10*3/uL (ref 0.7–4.0)
MCH: 28 pg (ref 26.0–34.0)
MCHC: 31.4 g/dL (ref 30.0–36.0)
MCV: 89.3 fL (ref 80.0–100.0)
Monocytes Absolute: 1.1 10*3/uL — ABNORMAL HIGH (ref 0.1–1.0)
Monocytes Relative: 7 %
NEUTROS ABS: 11.6 10*3/uL — AB (ref 1.7–7.7)
Neutrophils Relative %: 76 %
Platelets: 298 10*3/uL (ref 150–400)
RBC: 4.85 MIL/uL (ref 4.22–5.81)
RDW: 13.7 % (ref 11.5–15.5)
WBC: 15.2 10*3/uL — ABNORMAL HIGH (ref 4.0–10.5)
nRBC: 0 % (ref 0.0–0.2)

## 2018-04-27 LAB — COMPREHENSIVE METABOLIC PANEL
ALT: 29 U/L (ref 0–44)
AST: 25 U/L (ref 15–41)
Albumin: 4.4 g/dL (ref 3.5–5.0)
Alkaline Phosphatase: 86 U/L (ref 38–126)
Anion gap: 10 (ref 5–15)
BUN: 10 mg/dL (ref 6–20)
CO2: 29 mmol/L (ref 22–32)
Calcium: 9.2 mg/dL (ref 8.9–10.3)
Chloride: 99 mmol/L (ref 98–111)
Creatinine, Ser: 0.85 mg/dL (ref 0.61–1.24)
GFR calc Af Amer: >60 mL/min (ref >60)
GFR calc non Af Amer: >60 mL/min (ref >60)
Glucose, Bld: 130 mg/dL — ABNORMAL HIGH (ref 70–99)
POTASSIUM: 4.2 mmol/L (ref 3.5–5.1)
Sodium: 138 mmol/L (ref 135–145)
Total Bilirubin: 0.5 mg/dL (ref 0.3–1.2)
Total Protein: 8.1 g/dL (ref 6.5–8.1)

## 2018-04-27 LAB — LIPASE, BLOOD: Lipase: 35 U/L (ref 11–51)

## 2018-04-27 MED ORDER — ATOMOXETINE HCL 60 MG PO CAPS: 60.0000 mg | ORAL_CAPSULE | Freq: Every day | ORAL | Status: DC

## 2018-04-27 MED ORDER — FLUVOXAMINE MALEATE 100 MG PO TABS
100.0000 mg | ORAL_TABLET | Freq: Every day | ORAL | Status: DC
  Filled 2018-04-27 (×3): qty 1

## 2018-04-27 MED ORDER — KETOROLAC TROMETHAMINE 30 MG/ML IJ SOLN: 30.0000 mg | Freq: Four times a day (QID) | INTRAMUSCULAR | Status: DC

## 2018-04-27 MED ORDER — TRAZODONE HCL 50 MG PO TABS: 150.0000 mg | ORAL_TABLET | Freq: Every day | ORAL | Status: DC

## 2018-04-27 MED ORDER — QUETIAPINE FUMARATE 100 MG PO TABS: 100.0000 mg | ORAL_TABLET | Freq: Every day | ORAL | Status: DC

## 2018-04-27 MED ORDER — TRAZODONE HCL 50 MG PO TABS: 50.0000 mg | ORAL_TABLET | Freq: Every day | ORAL | Status: DC

## 2018-04-27 MED ORDER — DOCUSATE SODIUM 100 MG PO CAPS
100.0000 mg | ORAL_CAPSULE | Freq: Two times a day (BID) | ORAL | Status: DC
  Administered 2018-04-27: 100 mg via ORAL
  Filled 2018-04-27: qty 1

## 2018-04-27 MED ORDER — PANTOPRAZOLE SODIUM 40 MG PO TBEC
40.0000 mg | DELAYED_RELEASE_TABLET | Freq: Every day | ORAL | Status: DC
  Administered 2018-04-27: 40 mg via ORAL
  Filled 2018-04-27: qty 1

## 2018-04-27 MED ORDER — DOCUSATE SODIUM 100 MG PO CAPS: 100.0000 mg | ORAL_CAPSULE | Freq: Two times a day (BID) | ORAL | Status: DC

## 2018-04-27 MED ORDER — SODIUM CHLORIDE 0.9 % IV SOLN
INTRAVENOUS | Status: DC
  Administered 2018-04-27: 12:00:00 via INTRAVENOUS

## 2018-04-27 MED ORDER — TRAZODONE HCL 50 MG PO TABS: 200.0000 mg | ORAL_TABLET | Freq: Every day | ORAL | Status: DC

## 2018-04-27 MED ORDER — LAMOTRIGINE 100 MG PO TABS: 200.0000 mg | ORAL_TABLET | Freq: Every day | ORAL | Status: DC

## 2018-04-27 MED ORDER — HYDROMORPHONE HCL 1 MG/ML IJ SOLN: 0.5000 mg | INTRAMUSCULAR | Status: DC | PRN

## 2018-04-27 MED ORDER — HYDROMORPHONE HCL 1 MG/ML IJ SOLN
1.0000 mg | Freq: Once | INTRAMUSCULAR | Status: AC
  Administered 2018-04-27: 1 mg via INTRAVENOUS
  Filled 2018-04-27: qty 1

## 2018-04-27 MED ORDER — OXYCODONE HCL 5 MG PO TABS: 5.0000 mg | ORAL_TABLET | ORAL | 0 refills | Status: DC | PRN

## 2018-04-27 MED ORDER — CYCLOBENZAPRINE HCL 5 MG PO TABS: 5.0000 mg | ORAL_TABLET | Freq: Three times a day (TID) | ORAL | 0 refills | Status: DC | PRN

## 2018-04-27 MED ORDER — KETOROLAC TROMETHAMINE 30 MG/ML IJ SOLN
30.0000 mg | Freq: Four times a day (QID) | INTRAMUSCULAR | Status: AC
  Administered 2018-04-27 (×3): 30 mg via INTRAVENOUS
  Filled 2018-04-27 (×3): qty 1

## 2018-04-27 MED ORDER — OXYCODONE HCL 5 MG PO TABS
5.0000 mg | ORAL_TABLET | ORAL | Status: DC | PRN
  Administered 2018-04-27: 10 mg via ORAL
  Filled 2018-04-27: qty 2

## 2018-04-27 MED ORDER — CYCLOBENZAPRINE HCL 10 MG PO TABS
5.0000 mg | ORAL_TABLET | Freq: Three times a day (TID) | ORAL | Status: DC | PRN
  Administered 2018-04-27: 5 mg via ORAL
  Filled 2018-04-27: qty 1

## 2018-04-27 NOTE — Progress Notes (Signed)
Admitted earlier to room, alert and oriented.   Four lap chole sites from surgery on 04/25/18 dry and intact with liquid skin.  C/O right upper quadrant pain rated an 8.  Received 10 mg roxycodone and Dr. Constance Haw advanced diet to soft diet.  Received jello and coke.  Declined to walk in hallway

## 2018-04-27 NOTE — Care Management Obs Status (Signed)
[facility] NOTIFICATION   Patient Details  Name: [name] MRN: [id number] Date of Birth: [birth year]   Medicare Observation Status Notification Given:  Yes    [name] 04/27/2018, 2:56 PM

## 2018-04-27 NOTE — Discharge Summary (Signed)
Physician Discharge Summary  [name] MBW:[id number] DOB: Aug 24, 1970 DOA: 04/26/2018  PCP: Dion Body, MD  Admit date: 04/26/2018 Discharge date: 04/27/2018  Admitted From: Home Disposition:  Home   Recommendations for Outpatient Follow-up:  1. Follow up with PCP in 1-2 weeks 2. Please obtain BMP/CBC in one week   Discharge Condition: Stable CODE STATUS: FULL Diet recommendation: soft   Brief/Interim Summary: 48 year old male with a history of bipolar disorder, hypertension, COPD, tobacco abuse, cannabis use presenting with worsening abdominal pain that started around 4 PM on 04/26/2018.  The patient underwent a laparoscopic cholecystectomy in [place] on 04/25/2018.  He stated that he had abdominal pain at the time of discharge, but felt that it was controlled.  Patient stated that he ate some eggs and pancakes on the morning of 04/26/2018, and he was able to tolerate it.  However he had worsening pain later in the afternoon as discussed.  He had denied any vomiting but complaining of nausea.  He denied fevers, chills, dysuria, hematuria, hematochezia, melena.  He has not had a bowel movement since 04/24/2018.  Because of worsening pain, the patient took the  rest of his hydrocodone which she was prescribed for postoperative pain.  Upon presentation, the patient was afebrile hemodynamically stable saturating 98% room air.  WBC was 17.6.  BMP and LFTs were unremarkable.  CT of the abdomen and pelvis showed a small amount perihepatic ascites along the surface of the liver with consideration of bile leak versus postoperative fluid.  Dr. Constance Haw, General surgery was consulted to assist with management.  The case was discussed with Dr. Constance Haw.  She felt that this was likely postoperative pain and poor patient tolerance.  The patient was subsequently transitioned to oral oxycodone.  Flexeril and Toradol were also given.  His diet was advanced which she tolerated.  Over a period of 12 to  24 hours, the patient's pain was better managed.  The patient expressed that he wanted to go home since his pain was better managed.  The case was again discussed with Dr. Constance Haw who agreed with discharge home.  The patient will be discharged home with oxycodone 5 mg every 4 hours as needed pain, #20.  Flexeril 5 mg every 8 hours, #20 was also prescribed.  Discharge Diagnoses:   Intractable abdominal pain -Suspect musculoskeletal and postoperative pain -General surgery consult appreciated -Case discussed with Dr. Gavin Potters it was post op pain with poor tolerance.  No surgical indication -diet advancement per Dr. Constance Haw -IV dilaudid prn>>>oxycodone -04/26/2018 CT abdomen and pelvis-- small amount perihepatic ascites along the surface of the liver with consideration of bile leak versus postoperative fluid. -pt diet advanced and he tolerated. -IV fluids  Essential hypertension -Restart lisinopril once patient able to tolerate po -BP elevated in part due to pain  Bipolar disorder -Restart Strattera, Luvox, Seroquel, trazodone  Tobacco and cannabis use -Cessation discussed  Leukocytosis -likely stress demargination -pt remained afebrile and hemodynamically stable off abx -WBC decreased  Discharge Instructions   Allergies as of 04/27/2018      Reactions   Amitriptyline Other (See Comments)   Paralysis of pt       Medication List    STOP taking these medications   HYDROcodone-acetaminophen 5-325 MG tablet Commonly known as:  NORCO     TAKE these medications   albuterol 108 (90 Base) MCG/ACT inhaler Commonly known as:  PROVENTIL HFA;VENTOLIN HFA Inhale 2 puffs into the lungs every 6 (six) hours as needed for wheezing or shortness  of breath.   atomoxetine 60 MG capsule Commonly known as:  STRATTERA Take 1 capsule (60 mg total) by mouth daily.   cyclobenzaprine 5 MG tablet Commonly known as:  FLEXERIL Take 1 tablet (5 mg total) by mouth 3 (three) times daily as  needed for muscle spasms.   fluvoxaMINE 100 MG tablet Commonly known as:  LUVOX Take 1 tablet (100 mg total) by mouth at bedtime.   lamoTRIgine 200 MG tablet Commonly known as:  LAMICTAL Take 1 tablet (200 mg total) by mouth daily. What changed:  when to take this   lisinopril 10 MG tablet Commonly known as:  PRINIVIL,ZESTRIL Take 10 mg by mouth at bedtime.   omeprazole 20 MG capsule Commonly known as:  PRILOSEC Take 40 mg by mouth every morning.   oxyCODONE 5 MG immediate release tablet Commonly known as:  Oxy IR/ROXICODONE Take 1 tablet (5 mg total) by mouth every 4 (four) hours as needed for moderate pain.   QUEtiapine 200 MG tablet Commonly known as:  SEROQUEL Take 0.5 tablets (100 mg total) by mouth at bedtime. Pt has supply   traZODone 50 MG tablet Commonly known as:  DESYREL Take 50 mg by mouth at bedtime. What changed:  Another medication with the same name was changed. Make sure you understand how and when to take each.   traZODone 150 MG tablet Commonly known as:  DESYREL Take 1 tablet (150 mg total) by mouth at bedtime as needed for sleep. What changed:  when to take this       Allergies  Allergen Reactions  . Amitriptyline Other (See Comments)    Paralysis of pt     Consultations:  General surgery   Procedures/Studies: Ct Abdomen Pelvis W Contrast  Result Date: 04/26/2018 CLINICAL DATA:  Abdominal pain, post cholecystectomy EXAM: CT ABDOMEN AND PELVIS WITH CONTRAST TECHNIQUE: Multidetector CT imaging of the abdomen and pelvis was performed using the standard protocol following bolus administration of intravenous contrast. CONTRAST:  164mL OMNIPAQUE IOHEXOL 300 MG/ML  SOLN COMPARISON:  04/05/2018 FINDINGS: Lower chest: Bibasilar atelectasis.  No effusions. Hepatobiliary: Prior cholecystectomy. Stranding noted in the gallbladder fossa likely related to recent postoperative state. No focal fluid collection. There is a small amount of perihepatic ascites  along the surface of the liver. No focal hepatic abnormality. Pancreas: No focal abnormality or ductal dilatation. Spleen: No focal abnormality.  Normal size. Adrenals/Urinary Tract: No adrenal abnormality. No focal renal abnormality. No stones or hydronephrosis. Urinary bladder is unremarkable. Stomach/Bowel: Normal appendix. Stomach, large and small bowel grossly unremarkable. Vascular/Lymphatic: Aortic atherosclerosis. No enlarged abdominal or pelvic lymph nodes. IVC filter in place. Reproductive: No visible focal abnormality. Other: Trace free fluid in the cul-de-sac. Small amount of free fluid along the surface of the liver. No free air. Musculoskeletal: No acute bony abnormality. IMPRESSION: Changes of cholecystectomy. There is a small amount of fluid along the liver surface and in the cul-de-sac. This could be related to recent postoperative state or related to bowel leak. No drainable focal fluid collection. Otherwise no acute findings in the abdomen or pelvis. Bibasilar atelectasis. Electronically Signed   By: Rolm Baptise M.D.   On: 04/26/2018 20:16   Ct Abd Wo & W Cm  Result Date: 04/05/2018 CLINICAL DATA:  Right flank pain for 1 month. Possible right renal mass on recent CT. EXAM: CT ABDOMEN WITHOUT AND WITH CONTRAST TECHNIQUE: Multidetector CT imaging of the abdomen was performed following the standard protocol before and following the bolus administration of intravenous  contrast. CONTRAST:  170mL OMNIPAQUE IOHEXOL 300 MG/ML  SOLN COMPARISON:  02/15/2018 FINDINGS: Lower chest: No acute findings. Hepatobiliary: No hepatic masses identified. Gallbladder is unremarkable. Pancreas:  No mass or inflammatory changes. Spleen:  Within normal limits in size and appearance. Adrenals/Urinary Tract: Stable tiny sub-cm right adrenal nodule, consistent with benign adenoma. No evidence of urolithiasis or hydronephrosis. No complex cystic or solid renal masses identified. Stomach/Bowel: Visualized portion  unremarkable. Vascular/Lymphatic: No pathologically enlarged lymph nodes identified. No abdominal aortic aneurysm. Aortic atherosclerosis. IVC filter remains in appropriate position. Other:  None. Musculoskeletal:  No suspicious bone lesions identified. IMPRESSION: No evidence of renal calculi, hydronephrosis, or other acute findings. No evidence of renal mass. Electronically Signed   By: Earle Gell M.D.   On: 04/05/2018 15:14   US Abdomen Limited Ruq  Result Date: 04/02/2018 CLINICAL DATA:  Upper abdominal pain EXAM: ULTRASOUND ABDOMEN LIMITED RIGHT UPPER QUADRANT COMPARISON:  CT abdomen and pelvis February 15, 2014 FINDINGS: Gallbladder: There is a 7 mm gallstone adherent in the neck of the gallbladder. There is gallbladder wall thickening with subtle gallbladder edema. No appreciable pericholecystic fluid. No sonographic Murphy sign noted by sonographer. Common bile duct: Diameter: 2 mm. No intrahepatic or extrahepatic biliary duct dilatation. Liver: No focal lesion identified. Within normal limits in parenchymal echogenicity. Portal vein is patent on color Doppler imaging with normal direction of blood flow towards the liver. IMPRESSION: Gallstone adherent in the neck of the gallbladder with gallbladder wall thickening and subtle gallbladder wall edema. These are findings concerning for a degree of acute cholecystitis. Study otherwise unremarkable. These results will be called to the ordering clinician or representative by the Radiologist Assistant, and communication documented in the PACS or zVision Dashboard. Electronically Signed   By: Lowella Grip III M.D.   On: 04/02/2018 09:39         Discharge Exam: Vitals:   04/27/18 1001 04/27/18 1405  BP: (!) 166/93 (!) 150/88  Pulse: 78 73  Resp: 18 19  Temp: 97.9 F (36.6 C)   SpO2: 96% 99%   Vitals:   04/26/18 2147 04/27/18 0812 04/27/18 1001 04/27/18 1405  BP: (!) 141/79 (!) 190/94 (!) 166/93 (!) 150/88  Pulse:  80 78 73  Resp:   18  19  Temp:   97.9 F (36.6 C)   TempSrc:   Oral   SpO2:  92% 96% 99%  Weight:      Height:        General: Pt is alert, awake, not in acute distress Cardiovascular: RRR, S1/S2 +, no rubs, no gallops Respiratory: diminished BS, but CTA bilaterally, no wheezing, no rhonchi Abdominal: Soft, NT, ND, bowel sounds + Extremities: no edema, no cyanosis   The results of significant diagnostics from this hospitalization (including imaging, microbiology, ancillary and laboratory) are listed below for reference.    Significant Diagnostic Studies: Ct Abdomen Pelvis W Contrast  Result Date: 04/26/2018 CLINICAL DATA:  Abdominal pain, post cholecystectomy EXAM: CT ABDOMEN AND PELVIS WITH CONTRAST TECHNIQUE: Multidetector CT imaging of the abdomen and pelvis was performed using the standard protocol following bolus administration of intravenous contrast. CONTRAST:  113mL OMNIPAQUE IOHEXOL 300 MG/ML  SOLN COMPARISON:  04/05/2018 FINDINGS: Lower chest: Bibasilar atelectasis.  No effusions. Hepatobiliary: Prior cholecystectomy. Stranding noted in the gallbladder fossa likely related to recent postoperative state. No focal fluid collection. There is a small amount of perihepatic ascites along the surface of the liver. No focal hepatic abnormality. Pancreas: No focal abnormality or ductal dilatation. Spleen:  No focal abnormality.  Normal size. Adrenals/Urinary Tract: No adrenal abnormality. No focal renal abnormality. No stones or hydronephrosis. Urinary bladder is unremarkable. Stomach/Bowel: Normal appendix. Stomach, large and small bowel grossly unremarkable. Vascular/Lymphatic: Aortic atherosclerosis. No enlarged abdominal or pelvic lymph nodes. IVC filter in place. Reproductive: No visible focal abnormality. Other: Trace free fluid in the cul-de-sac. Small amount of free fluid along the surface of the liver. No free air. Musculoskeletal: No acute bony abnormality. IMPRESSION: Changes of cholecystectomy. There is  a small amount of fluid along the liver surface and in the cul-de-sac. This could be related to recent postoperative state or related to bowel leak. No drainable focal fluid collection. Otherwise no acute findings in the abdomen or pelvis. Bibasilar atelectasis. Electronically Signed   By: Rolm Baptise M.D.   On: 04/26/2018 20:16   Ct Abd Wo & W Cm  Result Date: 04/05/2018 CLINICAL DATA:  Right flank pain for 1 month. Possible right renal mass on recent CT. EXAM: CT ABDOMEN WITHOUT AND WITH CONTRAST TECHNIQUE: Multidetector CT imaging of the abdomen was performed following the standard protocol before and following the bolus administration of intravenous contrast. CONTRAST:  197mL OMNIPAQUE IOHEXOL 300 MG/ML  SOLN COMPARISON:  02/15/2018 FINDINGS: Lower chest: No acute findings. Hepatobiliary: No hepatic masses identified. Gallbladder is unremarkable. Pancreas:  No mass or inflammatory changes. Spleen:  Within normal limits in size and appearance. Adrenals/Urinary Tract: Stable tiny sub-cm right adrenal nodule, consistent with benign adenoma. No evidence of urolithiasis or hydronephrosis. No complex cystic or solid renal masses identified. Stomach/Bowel: Visualized portion unremarkable. Vascular/Lymphatic: No pathologically enlarged lymph nodes identified. No abdominal aortic aneurysm. Aortic atherosclerosis. IVC filter remains in appropriate position. Other:  None. Musculoskeletal:  No suspicious bone lesions identified. IMPRESSION: No evidence of renal calculi, hydronephrosis, or other acute findings. No evidence of renal mass. Electronically Signed   By: Earle Gell M.D.   On: 04/05/2018 15:14   US Abdomen Limited Ruq  Result Date: 04/02/2018 CLINICAL DATA:  Upper abdominal pain EXAM: ULTRASOUND ABDOMEN LIMITED RIGHT UPPER QUADRANT COMPARISON:  CT abdomen and pelvis February 15, 2014 FINDINGS: Gallbladder: There is a 7 mm gallstone adherent in the neck of the gallbladder. There is gallbladder wall  thickening with subtle gallbladder edema. No appreciable pericholecystic fluid. No sonographic Murphy sign noted by sonographer. Common bile duct: Diameter: 2 mm. No intrahepatic or extrahepatic biliary duct dilatation. Liver: No focal lesion identified. Within normal limits in parenchymal echogenicity. Portal vein is patent on color Doppler imaging with normal direction of blood flow towards the liver. IMPRESSION: Gallstone adherent in the neck of the gallbladder with gallbladder wall thickening and subtle gallbladder wall edema. These are findings concerning for a degree of acute cholecystitis. Study otherwise unremarkable. These results will be called to the ordering clinician or representative by the Radiologist Assistant, and communication documented in the PACS or zVision Dashboard. Electronically Signed   By: Lowella Grip III M.D.   On: 04/02/2018 09:39     Microbiology: No results found for this or any previous visit (from the past 240 hour(s)).   Labs: Basic Metabolic Panel: Recent Labs  Lab 04/26/18 1852 04/27/18 0538  NA 135 138  K 3.7 4.2  CL 97* 99  CO2 28 29  GLUCOSE 111* 130*  BUN 13 10  CREATININE 0.97 0.85  CALCIUM 8.9 9.2   Liver Function Tests: Recent Labs  Lab 04/26/18 1852 04/27/18 0538  AST 27 25  ALT 31 29  ALKPHOS 76 86  BILITOT  0.6 0.5  PROT 8.0 8.1  ALBUMIN 4.4 4.4   Recent Labs  Lab 04/26/18 1852 04/27/18 0538  LIPASE 49 35   No results for input(s): AMMONIA in the last 168 hours. CBC: Recent Labs  Lab 04/26/18 1852 04/27/18 0538  WBC 17.6* 15.2*  NEUTROABS 14.6* 11.6*  HGB 12.1* 13.6  HCT 39.5 43.3  MCV 89.2 89.3  PLT 283 298   Cardiac Enzymes: No results for input(s): CKTOTAL, CKMB, CKMBINDEX, TROPONINI in the last 168 hours. BNP: Invalid input(s): POCBNP CBG: No results for input(s): GLUCAP in the last 168 hours.  Time coordinating discharge:  36 minutes  Signed:  Orson Eva, DO [facility] Pager:  [phone] 04/27/2018, 4:55 PM

## 2018-04-27 NOTE — Progress Notes (Signed)
[facility]  Pain improving. Wanting a diet. Received toradol since being upstairs but no pain med. Pain reported at a 6-7 now. Will trial some roxicodone. Eat and get up and move.   Wanting to know when he will be able to leave.   Curlene Labrum, MD [facility] [address] [place], [place] [address] [phone] (office)

## 2018-04-27 NOTE — Progress Notes (Signed)
PROGRESS NOTE  Timothy FREIBERGER NGE:[id number] DOB: 11-19-1970 DOA: 04/26/2018 PCP: Dion Body, MD  Brief History:  48 year old male with a history of bipolar disorder, hypertension, COPD, tobacco abuse, cannabis use presenting with worsening abdominal pain that started around 4 PM on 04/26/2018.  The patient underwent a laparoscopic cholecystectomy in [place] on 04/25/2018.  He stated that he had abdominal pain at the time of discharge, but felt that it was controlled.  Patient stated that he ate some eggs and pancakes on the morning of 04/26/2018, and he was able to tolerate it.  However he had worsening pain later in the afternoon as discussed.  He had denied any vomiting but complaining of nausea.  He denied fevers, chills, dysuria, hematuria, hematochezia, melena.  He has not had a bowel movement since 04/24/2018.  Because of worsening pain, the patient took the  rest of his hydrocodone which she was prescribed for postoperative pain.  Upon presentation, the patient was afebrile hemodynamically stable saturating 98% room air.  WBC was 17.6.  BMP and LFTs were unremarkable.  CT of the abdomen and pelvis showed a small amount perihepatic ascites along the surface of the liver with consideration of bile leak versus postoperative fluid.  General surgery was consulted to assist with management.  Assessment/Plan: Intractable abdominal pain -Suspect musculoskeletal and postoperative pain -General surgery consult appreciated -Case discussed with Dr. Constance Haw -diet advancement per Dr. Constance Haw -IV dilaudid prn -04/26/2018 CT abdomen and pelvis-- small amount perihepatic ascites along the surface of the liver with consideration of bile leak versus postoperative fluid. -IV fluids  Essential hypertension -Restart lisinopril once patient able to tolerate po -BP elevated in part due to pain  Bipolar disorder -Restart Strattera, Luvox, Seroquel, trazodone  Tobacco and cannabis  use -Cessation discussed     Disposition Plan:   Home in 1-2 days when cleared by surgery Family Communication:   Family at bedside updated 2/14  Consultants:  General surgery  Code Status:  FULL   DVT Prophylaxis:  SCDs   Procedures: As Listed in Progress Note Above  Antibiotics: None  Total time spent 35 minutes.  Greater than 50% spent face to face counseling and coordinating care.      Subjective: Patient complained of abdominal pain with some nausea.  He states that his abdominal pain is low but better than when he first presented.  He denies any vomiting, chest pain, shortness breath, coughing, hemoptysis.  He has not had a bowel movement since 04/24/2018.  There is no hematemesis, fevers, chills.  Objective: Vitals:   04/26/18 1855 04/26/18 1900 04/26/18 1901 04/26/18 2147  BP:  (!) 141/91  (!) 141/79  Pulse: 87  85   Resp:      Temp:      TempSrc:      SpO2: 95%  95%   Weight:      Height:       No intake or output data in the 24 hours ending 04/27/18 0739 Weight change:  Exam:   General:  Pt is alert, follows commands appropriately, not in acute distress  HEENT: No icterus, No thrush, No neck mass, [place] 5/AT  Cardiovascular: RRR, S1/S2, no rubs, no gallops  Respiratory: Diminished breath sounds, but CTA bilaterally, no wheezing, no crackles, no rhonchi  Abdomen: Soft/+BS, RUQ, RLQ tender, non distended, no guarding  Extremities: No edema, No lymphangitis, No petechiae, No rashes, no synovitis   Data Reviewed: I have personally reviewed following  labs and imaging studies Basic Metabolic Panel: Recent Labs  Lab 04/26/18 1852 04/27/18 0538  NA 135 138  K 3.7 4.2  CL 97* 99  CO2 28 29  GLUCOSE 111* 130*  BUN 13 10  CREATININE 0.97 0.85  CALCIUM 8.9 9.2   Liver Function Tests: Recent Labs  Lab 04/26/18 1852 04/27/18 0538  AST 27 25  ALT 31 29  ALKPHOS 76 86  BILITOT 0.6 0.5  PROT 8.0 8.1  ALBUMIN 4.4 4.4   Recent Labs  Lab  04/26/18 1852 04/27/18 0538  LIPASE 49 35   No results for input(s): AMMONIA in the last 168 hours. Coagulation Profile: No results for input(s): INR, PROTIME in the last 168 hours. CBC: Recent Labs  Lab 04/26/18 1852 04/27/18 0538  WBC 17.6* 15.2*  NEUTROABS 14.6* 11.6*  HGB 12.1* 13.6  HCT 39.5 43.3  MCV 89.2 89.3  PLT 283 298   Cardiac Enzymes: No results for input(s): CKTOTAL, CKMB, CKMBINDEX, TROPONINI in the last 168 hours. BNP: Invalid input(s): POCBNP CBG: No results for input(s): GLUCAP in the last 168 hours. HbA1C: No results for input(s): HGBA1C in the last 72 hours. Urine analysis:    Component Value Date/Time   COLORURINE COLORLESS (A) 02/15/2018 1051   APPEARANCEUR CLEAR (A) 02/15/2018 1051   APPEARANCEUR Clear 12/27/2012 1933   LABSPEC 1.002 (L) 02/15/2018 1051   LABSPEC 1.029 12/27/2012 1933   PHURINE 6.0 02/15/2018 1051   GLUCOSEU NEGATIVE 02/15/2018 1051   GLUCOSEU Negative 12/27/2012 1933   HGBUR SMALL (A) 02/15/2018 1051   BILIRUBINUR NEGATIVE 02/15/2018 1051   BILIRUBINUR Negative 12/27/2012 1933   KETONESUR NEGATIVE 02/15/2018 1051   PROTEINUR NEGATIVE 02/15/2018 1051   UROBILINOGEN 0.2 08/23/2010 0532   NITRITE NEGATIVE 02/15/2018 1051   LEUKOCYTESUR NEGATIVE 02/15/2018 1051   LEUKOCYTESUR Negative 12/27/2012 1933   Sepsis Labs: @LABRCNTIP (procalcitonin:4,lacticidven:4) )No results found for this or any previous visit (from the past 240 hour(s)).   Scheduled Meds: .  morphine injection  4 mg Intravenous Once   Continuous Infusions: . sodium chloride 90 mL/hr at 04/26/18 2254    Procedures/Studies: Ct Abdomen Pelvis W Contrast  Result Date: 04/26/2018 CLINICAL DATA:  Abdominal pain, post cholecystectomy EXAM: CT ABDOMEN AND PELVIS WITH CONTRAST TECHNIQUE: Multidetector CT imaging of the abdomen and pelvis was performed using the standard protocol following bolus administration of intravenous contrast. CONTRAST:  169mL OMNIPAQUE  IOHEXOL 300 MG/ML  SOLN COMPARISON:  04/05/2018 FINDINGS: Lower chest: Bibasilar atelectasis.  No effusions. Hepatobiliary: Prior cholecystectomy. Stranding noted in the gallbladder fossa likely related to recent postoperative state. No focal fluid collection. There is a small amount of perihepatic ascites along the surface of the liver. No focal hepatic abnormality. Pancreas: No focal abnormality or ductal dilatation. Spleen: No focal abnormality.  Normal size. Adrenals/Urinary Tract: No adrenal abnormality. No focal renal abnormality. No stones or hydronephrosis. Urinary bladder is unremarkable. Stomach/Bowel: Normal appendix. Stomach, large and small bowel grossly unremarkable. Vascular/Lymphatic: Aortic atherosclerosis. No enlarged abdominal or pelvic lymph nodes. IVC filter in place. Reproductive: No visible focal abnormality. Other: Trace free fluid in the cul-de-sac. Small amount of free fluid along the surface of the liver. No free air. Musculoskeletal: No acute bony abnormality. IMPRESSION: Changes of cholecystectomy. There is a small amount of fluid along the liver surface and in the cul-de-sac. This could be related to recent postoperative state or related to bowel leak. No drainable focal fluid collection. Otherwise no acute findings in the abdomen or pelvis. Bibasilar atelectasis. Electronically Signed  By: Rolm Baptise M.D.   On: 04/26/2018 20:16   Ct Abd Wo & W Cm  Result Date: 04/05/2018 CLINICAL DATA:  Right flank pain for 1 month. Possible right renal mass on recent CT. EXAM: CT ABDOMEN WITHOUT AND WITH CONTRAST TECHNIQUE: Multidetector CT imaging of the abdomen was performed following the standard protocol before and following the bolus administration of intravenous contrast. CONTRAST:  137mL OMNIPAQUE IOHEXOL 300 MG/ML  SOLN COMPARISON:  02/15/2018 FINDINGS: Lower chest: No acute findings. Hepatobiliary: No hepatic masses identified. Gallbladder is unremarkable. Pancreas:  No mass or  inflammatory changes. Spleen:  Within normal limits in size and appearance. Adrenals/Urinary Tract: Stable tiny sub-cm right adrenal nodule, consistent with benign adenoma. No evidence of urolithiasis or hydronephrosis. No complex cystic or solid renal masses identified. Stomach/Bowel: Visualized portion unremarkable. Vascular/Lymphatic: No pathologically enlarged lymph nodes identified. No abdominal aortic aneurysm. Aortic atherosclerosis. IVC filter remains in appropriate position. Other:  None. Musculoskeletal:  No suspicious bone lesions identified. IMPRESSION: No evidence of renal calculi, hydronephrosis, or other acute findings. No evidence of renal mass. Electronically Signed   By: Earle Gell M.D.   On: 04/05/2018 15:14   US Abdomen Limited Ruq  Result Date: 04/02/2018 CLINICAL DATA:  Upper abdominal pain EXAM: ULTRASOUND ABDOMEN LIMITED RIGHT UPPER QUADRANT COMPARISON:  CT abdomen and pelvis February 15, 2014 FINDINGS: Gallbladder: There is a 7 mm gallstone adherent in the neck of the gallbladder. There is gallbladder wall thickening with subtle gallbladder edema. No appreciable pericholecystic fluid. No sonographic Murphy sign noted by sonographer. Common bile duct: Diameter: 2 mm. No intrahepatic or extrahepatic biliary duct dilatation. Liver: No focal lesion identified. Within normal limits in parenchymal echogenicity. Portal vein is patent on color Doppler imaging with normal direction of blood flow towards the liver. IMPRESSION: Gallstone adherent in the neck of the gallbladder with gallbladder wall thickening and subtle gallbladder wall edema. These are findings concerning for a degree of acute cholecystitis. Study otherwise unremarkable. These results will be called to the ordering clinician or representative by the Radiologist Assistant, and communication documented in the PACS or zVision Dashboard. Electronically Signed   By: Lowella Grip III M.D.   On: 04/02/2018 09:39    Orson Eva,  DO  [facility] Pager [phone]  If 7PM-7AM, please contact night-coverage [email or web address] Password [facility] 04/27/2018, 7:39 AM   LOS: 0 days

## 2018-04-27 NOTE — ED Notes (Signed)
[name] [phone] contact if needed

## 2018-04-27 NOTE — Progress Notes (Signed)
IV removed and scripts sent to pharmacy.  Discharge instructions reviewed with patient and mother. Mother to drive home

## 2018-04-27 NOTE — Progress Notes (Signed)
[facility]  Saw patient again this afternoon. Pain improved. Walked in room. Says he wants to go home and have BM. Wants to use miralax at home. Says his pain is improved enough with the flexeril and roxicodone to go home. Notified Dr.Tat. Can follow up with his surgeon in [place].  Curlene Labrum, MD [facility] [address] [place], [place] [address] [phone] (office)

## 2018-04-27 NOTE — Progress Notes (Signed)
[facility]  Patient seen. Full note to follow. Labs reassuring, WBC down. CT reviewed, no oral contrast but minimal fluid, fossa looks appropriate. LFTs normal. Likely this pain is all muscular based on patient's report and exam. Asking for his "psych" meds and reports being on Narcotic pain medication in the past.   Giving Toradol, Dilaudid, and flexeril now. Will see how this improves pain. Roxicodone also added. Home Po meds added except for BP med.  Will see again later this Am.  Curlene Labrum, MD [facility] [address] [place], [place] [address] [phone] (office)
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
## 2018-04-28 LAB — HIV ANTIBODY (ROUTINE TESTING W REFLEX): HIV Screen 4th Generation wRfx: NONREACTIVE

## 2018-05-24 DIAGNOSIS — E782 Mixed hyperlipidemia: Secondary | ICD-10-CM | POA: Diagnosis not present

## 2018-05-24 DIAGNOSIS — I10 Essential (primary) hypertension: Secondary | ICD-10-CM | POA: Diagnosis not present

## 2018-05-24 DIAGNOSIS — R7303 Prediabetes: Secondary | ICD-10-CM | POA: Diagnosis not present

## 2018-05-29 DIAGNOSIS — R7303 Prediabetes: Secondary | ICD-10-CM | POA: Diagnosis not present

## 2018-05-29 DIAGNOSIS — I10 Essential (primary) hypertension: Secondary | ICD-10-CM | POA: Diagnosis not present

## 2018-05-29 DIAGNOSIS — E782 Mixed hyperlipidemia: Secondary | ICD-10-CM | POA: Diagnosis not present

## 2018-05-29 DIAGNOSIS — F331 Major depressive disorder, recurrent, moderate: Secondary | ICD-10-CM | POA: Diagnosis not present

## 2018-05-29 DIAGNOSIS — R569 Unspecified convulsions: Secondary | ICD-10-CM | POA: Diagnosis not present

## 2018-06-18 ENCOUNTER — Encounter: Payer: Self-pay | Admitting: Psychiatry

## 2018-06-18 ENCOUNTER — Other Ambulatory Visit: Payer: Self-pay

## 2018-06-18 ENCOUNTER — Ambulatory Visit (INDEPENDENT_AMBULATORY_CARE_PROVIDER_SITE_OTHER): Payer: Medicare HMO | Admitting: Psychiatry

## 2018-06-18 DIAGNOSIS — F908 Attention-deficit hyperactivity disorder, other type: Secondary | ICD-10-CM

## 2018-06-18 DIAGNOSIS — F39 Unspecified mood [affective] disorder: Secondary | ICD-10-CM

## 2018-06-18 MED ORDER — TRAZODONE HCL 150 MG PO TABS: 150.0000 mg | ORAL_TABLET | Freq: Every evening | ORAL | 3 refills | Status: DC | PRN

## 2018-06-18 MED ORDER — QUETIAPINE FUMARATE 200 MG PO TABS: 200.0000 mg | ORAL_TABLET | Freq: Every day | ORAL | 2 refills | Status: DC

## 2018-06-18 MED ORDER — ATOMOXETINE HCL 60 MG PO CAPS: 60.0000 mg | ORAL_CAPSULE | Freq: Every day | ORAL | 2 refills | Status: DC

## 2018-06-18 MED ORDER — FLUVOXAMINE MALEATE 100 MG PO TABS: 100.0000 mg | ORAL_TABLET | Freq: Every day | ORAL | 1 refills | Status: DC

## 2018-06-18 MED ORDER — TRAZODONE HCL 50 MG PO TABS: 50.0000 mg | ORAL_TABLET | Freq: Every day | ORAL | 1 refills | Status: DC

## 2018-06-18 MED ORDER — LAMOTRIGINE 200 MG PO TABS: 200.0000 mg | ORAL_TABLET | Freq: Every day | ORAL | 2 refills | Status: DC

## 2018-06-18 NOTE — Progress Notes (Signed)
Patient ID: [name], male   DOB: 06/21/1970, 48 y.o.   MRN: [id number]  Patient is a 48 year old male with history of episodic mood disorder and ADHD who was followed up for medication. His mother communicated on the phone regarding his medications as she was worried about the refills. She reported that he has been staying at home but has been coughing since this morning as he has long history of seasonal allergies and he went out to smoke. She reported that he has been taking his medications as prescribed and she wants 90 days refill on his medications. He has appointment with his pulmonologist,  Dr.Fleming this week.  Patient reported that he has been doing well and has been trying to stay indoors due to the pandemic. He stated that he is feeling anxious as has nothing to do.  He currently denied having any suicidal ideations or plans. No other acute issues noted at this time. He is able to contract for safety.   Plan.  Refilled his medications for 90 days supply. Mother stated that he is is stable and does not have any acute issues.  I have discussed the assessment and treatment plan with the patient. The patient was provided an opportunity to ask questions and all were answered. The patient agreed with the plan and demonstrated an understanding of the instructions.   The patient was advised to call back or seek an in-person evaluation if the symptoms worsen or if the condition fails to improve as anticipated.   I provided 10 minutes of non-face-to-face time during this encounter.

## 2018-06-18 NOTE — Progress Notes (Cosign Needed)
TC on  06-18-18 @10 :59 spoke with patients . Medications and pharmacy was reviewed and updated.   Allergies , medical hx and surgery hx was reviewed with no changes.  Vital unable to obtain due to this is a phone or web consult.

## 2018-06-19 DIAGNOSIS — J302 Other seasonal allergic rhinitis: Secondary | ICD-10-CM | POA: Diagnosis not present

## 2018-06-20 DIAGNOSIS — J45901 Unspecified asthma with (acute) exacerbation: Secondary | ICD-10-CM | POA: Diagnosis not present

## 2018-06-20 DIAGNOSIS — J441 Chronic obstructive pulmonary disease with (acute) exacerbation: Secondary | ICD-10-CM | POA: Diagnosis not present

## 2018-06-20 DIAGNOSIS — R05 Cough: Secondary | ICD-10-CM | POA: Diagnosis not present

## 2018-06-25 ENCOUNTER — Ambulatory Visit
Admission: RE | Admit: 2018-06-25 | Payer: Medicare HMO | Source: Ambulatory Visit | Admitting: Unknown Physician Specialty

## 2018-06-25 ENCOUNTER — Encounter: Admission: RE | Payer: Self-pay | Source: Ambulatory Visit

## 2018-06-25 SURGERY — ESOPHAGOGASTRODUODENOSCOPY (EGD) WITH PROPOFOL
Anesthesia: General

## 2018-07-16 DIAGNOSIS — K625 Hemorrhage of anus and rectum: Secondary | ICD-10-CM | POA: Diagnosis not present

## 2018-07-16 DIAGNOSIS — K219 Gastro-esophageal reflux disease without esophagitis: Secondary | ICD-10-CM | POA: Diagnosis not present

## 2018-07-16 DIAGNOSIS — Z800 Family history of malignant neoplasm of digestive organs: Secondary | ICD-10-CM | POA: Diagnosis not present

## 2018-07-16 DIAGNOSIS — R110 Nausea: Secondary | ICD-10-CM | POA: Diagnosis not present

## 2018-09-17 DIAGNOSIS — R197 Diarrhea, unspecified: Secondary | ICD-10-CM | POA: Diagnosis not present

## 2018-10-01 ENCOUNTER — Ambulatory Visit (INDEPENDENT_AMBULATORY_CARE_PROVIDER_SITE_OTHER): Payer: Medicare HMO | Admitting: Psychiatry

## 2018-10-01 ENCOUNTER — Other Ambulatory Visit: Payer: Self-pay

## 2018-10-01 ENCOUNTER — Encounter: Payer: Self-pay | Admitting: Psychiatry

## 2018-10-01 DIAGNOSIS — F39 Unspecified mood [affective] disorder: Secondary | ICD-10-CM | POA: Diagnosis not present

## 2018-10-01 DIAGNOSIS — F908 Attention-deficit hyperactivity disorder, other type: Secondary | ICD-10-CM

## 2018-10-01 MED ORDER — ATOMOXETINE HCL 60 MG PO CAPS: 60.0000 mg | ORAL_CAPSULE | Freq: Every day | ORAL | 2 refills | Status: DC

## 2018-10-01 MED ORDER — QUETIAPINE FUMARATE 200 MG PO TABS: 200.0000 mg | ORAL_TABLET | Freq: Every day | ORAL | 2 refills | Status: DC

## 2018-10-01 MED ORDER — LAMOTRIGINE 200 MG PO TABS: 200.0000 mg | ORAL_TABLET | Freq: Every day | ORAL | 2 refills | Status: DC

## 2018-10-01 MED ORDER — TRAZODONE HCL 150 MG PO TABS: 150.0000 mg | ORAL_TABLET | Freq: Every evening | ORAL | 3 refills | Status: DC | PRN

## 2018-10-01 MED ORDER — FLUVOXAMINE MALEATE 100 MG PO TABS: 100.0000 mg | ORAL_TABLET | Freq: Every day | ORAL | 1 refills | Status: DC

## 2018-10-01 MED ORDER — TRAZODONE HCL 50 MG PO TABS: 50.0000 mg | ORAL_TABLET | Freq: Every day | ORAL | 1 refills | Status: DC

## 2018-10-01 NOTE — Progress Notes (Signed)
Patient ID: [name], male   DOB: 07-21-1970, 48 y.o.   MRN: [id number]   Patient is a 48 year old Male who was followed up for medication management. He reported that he has been trying to quit tobacco was smoking and has been doing well. He currently lives with his mother who has been very supportive and has been helping him with his medications. He reported that he is trying to stay away from [place] and keeping himself healthy. He stated that the medications are helping him and he sleeping well at night. He's trying to have a vegetable garden and was talking in detail about the same. He appeared calm and alert during the interview. His mother was also present and she reported that they need the refills on his current medications. He denied having any side effects and denied having any suicidal or homicidal ideation or plans.  Plan I will refill his medications. He will follow-up in three months earlier depending on his symptoms.  I connected withpatient via telemedicine application and verified that I am speaking with the correct person using two identifiers. I discussed the limitations of evaluation and management by telemedicine and the availability of in person appointments. The patient expressed understanding and agreed to proceed.  I discussed the assessment and treatment plan with the patient. The patient was provided an opportunity to ask questions and all were answered. The patient agreed with the plan and demonstrated an understanding of the instructions.  The patient was advised to call back or seek an in-person evaluation if the symptoms worsen or if the condition fails to improve as anticipated.  I provided32minutes of non-face-to-face time during this encounter.

## 2018-10-02 DIAGNOSIS — J432 Centrilobular emphysema: Secondary | ICD-10-CM | POA: Diagnosis not present

## 2018-10-02 DIAGNOSIS — R0600 Dyspnea, unspecified: Secondary | ICD-10-CM | POA: Diagnosis not present

## 2018-10-02 DIAGNOSIS — R079 Chest pain, unspecified: Secondary | ICD-10-CM | POA: Diagnosis not present

## 2018-10-03 ENCOUNTER — Emergency Department
Admission: EM | Admit: 2018-10-03 | Discharge: 2018-10-03 | Disposition: A | Discharge status: Home / Self Care | Payer: Medicare HMO | Attending: Student in an Organized Health Care Education/Training Program | Admitting: Student in an Organized Health Care Education/Training Program

## 2018-10-03 ENCOUNTER — Emergency Department: Payer: Medicare HMO

## 2018-10-03 ENCOUNTER — Other Ambulatory Visit: Payer: Self-pay

## 2018-10-03 ENCOUNTER — Encounter: Payer: Self-pay | Admitting: Emergency Medicine

## 2018-10-03 DIAGNOSIS — I10 Essential (primary) hypertension: Secondary | ICD-10-CM | POA: Insufficient documentation

## 2018-10-03 DIAGNOSIS — R079 Chest pain, unspecified: Secondary | ICD-10-CM

## 2018-10-03 DIAGNOSIS — F17290 Nicotine dependence, other tobacco product, uncomplicated: Secondary | ICD-10-CM | POA: Insufficient documentation

## 2018-10-03 DIAGNOSIS — F1210 Cannabis abuse, uncomplicated: Secondary | ICD-10-CM | POA: Diagnosis not present

## 2018-10-03 DIAGNOSIS — R51 Headache: Secondary | ICD-10-CM | POA: Diagnosis not present

## 2018-10-03 DIAGNOSIS — R0602 Shortness of breath: Secondary | ICD-10-CM | POA: Diagnosis not present

## 2018-10-03 LAB — CBC
HCT: 41.8 % (ref 39.0–52.0)
Hemoglobin: 13.6 g/dL (ref 13.0–17.0)
MCH: 28.5 pg (ref 26.0–34.0)
MCHC: 32.5 g/dL (ref 30.0–36.0)
MCV: 87.6 fL (ref 80.0–100.0)
Platelets: 277 10*3/uL (ref 150–400)
RBC: 4.77 MIL/uL (ref 4.22–5.81)
RDW: 13.6 % (ref 11.5–15.5)
WBC: 10 10*3/uL (ref 4.0–10.5)
nRBC: 0 % (ref 0.0–0.2)

## 2018-10-03 LAB — BASIC METABOLIC PANEL
Anion gap: 9 (ref 5–15)
BUN: 13 mg/dL (ref 6–20)
CO2: 25 mmol/L (ref 22–32)
Calcium: 9.1 mg/dL (ref 8.9–10.3)
Chloride: 105 mmol/L (ref 98–111)
Creatinine, Ser: 0.85 mg/dL (ref 0.61–1.24)
GFR calc Af Amer: >60 mL/min (ref >60)
GFR calc non Af Amer: >60 mL/min (ref >60)
Glucose, Bld: 117 mg/dL — ABNORMAL HIGH (ref 70–99)
Potassium: 3.6 mmol/L (ref 3.5–5.1)
Sodium: 139 mmol/L (ref 135–145)

## 2018-10-03 LAB — TROPONIN I (HIGH SENSITIVITY)
Troponin I (High Sensitivity): 4 ng/L (ref <18)
Troponin I (High Sensitivity): 5 ng/L (ref <18)

## 2018-10-03 MED ORDER — LORAZEPAM 1 MG PO TABS
1.0000 mg | ORAL_TABLET | Freq: Once | ORAL | Status: AC
  Administered 2018-10-03: 1 mg via ORAL
  Filled 2018-10-03: qty 1

## 2018-10-03 MED ORDER — ACETAMINOPHEN 500 MG PO TABS
1000.0000 mg | ORAL_TABLET | Freq: Once | ORAL | Status: AC
  Administered 2018-10-03: 1000 mg via ORAL
  Filled 2018-10-03: qty 2

## 2018-10-03 NOTE — ED Notes (Signed)
NAD noted at time of D/C. Pt denies questions or concerns. Pt ambulatory to the lobby at this time to wait for his mother to pick him up, pt states he does not have a driver's license.

## 2018-10-03 NOTE — ED Triage Notes (Signed)
Pt arrives with complaints of central chest pain that started 4 days prior. Pt reports the pain feels like tightness.
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
## 2018-10-08 ENCOUNTER — Other Ambulatory Visit
Admission: RE | Admit: 2018-10-08 | Discharge: 2018-10-08 | Disposition: A | Discharge status: Home / Self Care | Payer: Medicare HMO | Source: Ambulatory Visit | Attending: Student | Admitting: Student

## 2018-10-08 DIAGNOSIS — R197 Diarrhea, unspecified: Secondary | ICD-10-CM | POA: Insufficient documentation

## 2018-10-08 LAB — GASTROINTESTINAL PANEL BY PCR, STOOL (REPLACES STOOL CULTURE)

## 2018-10-08 LAB — C DIFFICILE QUICK SCREEN W PCR REFLEX
C Diff antigen: NEGATIVE
C Diff interpretation: NOT DETECTED
C Diff toxin: NEGATIVE

## 2018-10-11 LAB — PANCREATIC ELASTASE, FECAL: Pancreatic Elastase-1, Stool: >500 ug Elast./g (ref >200)

## 2018-10-12 LAB — CALPROTECTIN, FECAL: Calprotectin, Fecal: 32 ug/g (ref 0–120)

## 2018-10-22 DIAGNOSIS — Z01818 Encounter for other preprocedural examination: Secondary | ICD-10-CM | POA: Diagnosis not present

## 2018-10-22 DIAGNOSIS — F5104 Psychophysiologic insomnia: Secondary | ICD-10-CM | POA: Diagnosis not present

## 2018-10-22 DIAGNOSIS — I10 Essential (primary) hypertension: Secondary | ICD-10-CM | POA: Diagnosis not present

## 2018-10-22 DIAGNOSIS — E782 Mixed hyperlipidemia: Secondary | ICD-10-CM | POA: Diagnosis not present

## 2018-10-22 DIAGNOSIS — I208 Other forms of angina pectoris: Secondary | ICD-10-CM | POA: Diagnosis not present

## 2018-10-22 DIAGNOSIS — E119 Type 2 diabetes mellitus without complications: Secondary | ICD-10-CM | POA: Diagnosis not present

## 2018-10-22 DIAGNOSIS — R569 Unspecified convulsions: Secondary | ICD-10-CM | POA: Diagnosis not present

## 2018-10-22 DIAGNOSIS — F419 Anxiety disorder, unspecified: Secondary | ICD-10-CM | POA: Diagnosis not present

## 2018-10-22 DIAGNOSIS — F331 Major depressive disorder, recurrent, moderate: Secondary | ICD-10-CM | POA: Diagnosis not present

## 2018-10-22 DIAGNOSIS — R011 Cardiac murmur, unspecified: Secondary | ICD-10-CM | POA: Diagnosis not present

## 2018-11-13 DIAGNOSIS — I208 Other forms of angina pectoris: Secondary | ICD-10-CM | POA: Diagnosis not present

## 2018-11-13 DIAGNOSIS — R011 Cardiac murmur, unspecified: Secondary | ICD-10-CM | POA: Diagnosis not present

## 2018-11-15 DIAGNOSIS — E782 Mixed hyperlipidemia: Secondary | ICD-10-CM | POA: Diagnosis not present

## 2018-11-15 DIAGNOSIS — F5104 Psychophysiologic insomnia: Secondary | ICD-10-CM | POA: Diagnosis not present

## 2018-11-15 DIAGNOSIS — I10 Essential (primary) hypertension: Secondary | ICD-10-CM | POA: Diagnosis not present

## 2018-11-15 DIAGNOSIS — F068 Other specified mental disorders due to known physiological condition: Secondary | ICD-10-CM | POA: Diagnosis not present

## 2018-11-15 DIAGNOSIS — F331 Major depressive disorder, recurrent, moderate: Secondary | ICD-10-CM | POA: Diagnosis not present

## 2018-11-15 DIAGNOSIS — R569 Unspecified convulsions: Secondary | ICD-10-CM | POA: Diagnosis not present

## 2018-11-15 DIAGNOSIS — I208 Other forms of angina pectoris: Secondary | ICD-10-CM | POA: Diagnosis not present

## 2018-11-15 DIAGNOSIS — F419 Anxiety disorder, unspecified: Secondary | ICD-10-CM | POA: Diagnosis not present

## 2018-11-15 DIAGNOSIS — R011 Cardiac murmur, unspecified: Secondary | ICD-10-CM | POA: Diagnosis not present

## 2018-11-21 DIAGNOSIS — E782 Mixed hyperlipidemia: Secondary | ICD-10-CM | POA: Diagnosis not present

## 2018-11-21 DIAGNOSIS — R7303 Prediabetes: Secondary | ICD-10-CM | POA: Diagnosis not present

## 2018-11-21 DIAGNOSIS — I10 Essential (primary) hypertension: Secondary | ICD-10-CM | POA: Diagnosis not present

## 2018-11-28 DIAGNOSIS — R7303 Prediabetes: Secondary | ICD-10-CM | POA: Diagnosis not present

## 2018-11-28 DIAGNOSIS — Z0000 Encounter for general adult medical examination without abnormal findings: Secondary | ICD-10-CM | POA: Diagnosis not present

## 2018-11-28 DIAGNOSIS — F331 Major depressive disorder, recurrent, moderate: Secondary | ICD-10-CM | POA: Diagnosis not present

## 2018-11-28 DIAGNOSIS — F17210 Nicotine dependence, cigarettes, uncomplicated: Secondary | ICD-10-CM | POA: Diagnosis not present

## 2018-11-28 DIAGNOSIS — E785 Hyperlipidemia, unspecified: Secondary | ICD-10-CM | POA: Diagnosis not present

## 2018-12-10 ENCOUNTER — Ambulatory Visit (INDEPENDENT_AMBULATORY_CARE_PROVIDER_SITE_OTHER): Payer: Medicare HMO | Admitting: Psychiatry

## 2018-12-10 ENCOUNTER — Encounter: Payer: Self-pay | Admitting: Psychiatry

## 2018-12-10 ENCOUNTER — Other Ambulatory Visit: Payer: Self-pay

## 2018-12-10 DIAGNOSIS — F39 Unspecified mood [affective] disorder: Secondary | ICD-10-CM

## 2018-12-10 DIAGNOSIS — F908 Attention-deficit hyperactivity disorder, other type: Secondary | ICD-10-CM

## 2018-12-10 MED ORDER — QUETIAPINE FUMARATE 200 MG PO TABS: 200.0000 mg | ORAL_TABLET | Freq: Every day | ORAL | 2 refills | Status: DC

## 2018-12-10 MED ORDER — TRAZODONE HCL 50 MG PO TABS: 50.0000 mg | ORAL_TABLET | Freq: Every day | ORAL | 1 refills | Status: DC

## 2018-12-10 MED ORDER — TRAZODONE HCL 150 MG PO TABS: 150.0000 mg | ORAL_TABLET | Freq: Every evening | ORAL | 3 refills | Status: DC | PRN

## 2018-12-10 MED ORDER — FLUVOXAMINE MALEATE 100 MG PO TABS: 100.0000 mg | ORAL_TABLET | Freq: Every day | ORAL | 1 refills | Status: DC

## 2018-12-10 MED ORDER — LAMOTRIGINE 200 MG PO TABS: 200.0000 mg | ORAL_TABLET | Freq: Every day | ORAL | 2 refills | Status: DC

## 2018-12-10 NOTE — Progress Notes (Signed)
Patient ID: [name], male   DOB: 08-28-1970, 48 y.o.   MRN: [id number]   Patient is a 48 year old male with history of Mood disorder who was followed for medication management. His mother was also present on the phone. Patient reported that he has stopped taking the Strattera as it was making him tired. He is also trying to quit smoking. He reported that he has not Smoking now. He stated that he is having difficulty sleeping at night but he is compliant with his medications. He reported that he does not have anything to do at home and is getting tired due to Covid. He is spending most of the time at home. His mother remains supportive. We discussed about his medication compliance. His mother is supportive and has been taking him to his appointments on a regular basis. Patient does not have any other acute symptoms at this time.  Plan I will discontinue Strattera. Continue other medications as prescribed. He will follow-up in two months earlier depending on his symptoms  I connected with patient via telemedicine application and verified that I am speaking with the correct person using two identifiers.  I discussed the limitations of evaluation and management by telemedicine and the availability of in person appointments. The patient expressed understanding and agreed to proceed.   I discussed the assessment and treatment plan with the patient. The patient was provided an opportunity to ask questions and all were answered. The patient agreed with the plan and demonstrated an understanding of the instructions.   The patient was advised to call back or seek an in-person evaluation if the symptoms worsen or if the condition fails to improve as anticipated.   I provided 15 minutes of non-face-to-face time during this encounter.

## 2018-12-17 DIAGNOSIS — R569 Unspecified convulsions: Secondary | ICD-10-CM | POA: Diagnosis not present

## 2018-12-17 DIAGNOSIS — E559 Vitamin D deficiency, unspecified: Secondary | ICD-10-CM | POA: Diagnosis not present

## 2018-12-17 DIAGNOSIS — E538 Deficiency of other specified B group vitamins: Secondary | ICD-10-CM | POA: Diagnosis not present

## 2018-12-17 DIAGNOSIS — Z79899 Other long term (current) drug therapy: Secondary | ICD-10-CM | POA: Diagnosis not present

## 2018-12-20 ENCOUNTER — Other Ambulatory Visit
Admission: RE | Admit: 2018-12-20 | Discharge: 2018-12-20 | Disposition: A | Discharge status: Home / Self Care | Payer: Medicare HMO | Source: Ambulatory Visit | Attending: Internal Medicine | Admitting: Internal Medicine

## 2018-12-20 ENCOUNTER — Other Ambulatory Visit: Payer: Self-pay

## 2018-12-20 DIAGNOSIS — Z01812 Encounter for preprocedural laboratory examination: Secondary | ICD-10-CM | POA: Insufficient documentation

## 2018-12-20 DIAGNOSIS — Z20828 Contact with and (suspected) exposure to other viral communicable diseases: Secondary | ICD-10-CM | POA: Insufficient documentation

## 2018-12-20 LAB — SARS CORONAVIRUS 2 (TAT 6-24 HRS): SARS Coronavirus 2: NEGATIVE

## 2018-12-24 ENCOUNTER — Ambulatory Visit: Payer: Medicare HMO | Admitting: Anesthesiology

## 2018-12-24 ENCOUNTER — Encounter
Admission: RE | Disposition: A | Discharge status: Home / Self Care | Payer: Self-pay | Source: Ambulatory Visit | Attending: Internal Medicine

## 2018-12-24 ENCOUNTER — Ambulatory Visit
Admission: RE | Admit: 2018-12-24 | Discharge: 2018-12-24 | Disposition: A | Discharge status: Home / Self Care | Payer: Medicare HMO | Source: Ambulatory Visit | Attending: Internal Medicine | Admitting: Internal Medicine

## 2018-12-24 ENCOUNTER — Other Ambulatory Visit: Payer: Self-pay

## 2018-12-24 ENCOUNTER — Encounter: Payer: Self-pay | Admitting: Anesthesiology

## 2018-12-24 DIAGNOSIS — G4700 Insomnia, unspecified: Secondary | ICD-10-CM | POA: Insufficient documentation

## 2018-12-24 DIAGNOSIS — K648 Other hemorrhoids: Secondary | ICD-10-CM | POA: Diagnosis not present

## 2018-12-24 DIAGNOSIS — K228 Other specified diseases of esophagus: Secondary | ICD-10-CM | POA: Diagnosis not present

## 2018-12-24 DIAGNOSIS — D122 Benign neoplasm of ascending colon: Secondary | ICD-10-CM | POA: Diagnosis not present

## 2018-12-24 DIAGNOSIS — J439 Emphysema, unspecified: Secondary | ICD-10-CM | POA: Insufficient documentation

## 2018-12-24 DIAGNOSIS — K2950 Unspecified chronic gastritis without bleeding: Secondary | ICD-10-CM | POA: Diagnosis not present

## 2018-12-24 DIAGNOSIS — F419 Anxiety disorder, unspecified: Secondary | ICD-10-CM | POA: Diagnosis not present

## 2018-12-24 DIAGNOSIS — F0390 Unspecified dementia without behavioral disturbance: Secondary | ICD-10-CM | POA: Diagnosis not present

## 2018-12-24 DIAGNOSIS — K5790 Diverticulosis of intestine, part unspecified, without perforation or abscess without bleeding: Secondary | ICD-10-CM | POA: Diagnosis not present

## 2018-12-24 DIAGNOSIS — Z79899 Other long term (current) drug therapy: Secondary | ICD-10-CM | POA: Diagnosis not present

## 2018-12-24 DIAGNOSIS — K5730 Diverticulosis of large intestine without perforation or abscess without bleeding: Secondary | ICD-10-CM | POA: Insufficient documentation

## 2018-12-24 DIAGNOSIS — F17200 Nicotine dependence, unspecified, uncomplicated: Secondary | ICD-10-CM | POA: Diagnosis not present

## 2018-12-24 DIAGNOSIS — R110 Nausea: Secondary | ICD-10-CM | POA: Diagnosis not present

## 2018-12-24 DIAGNOSIS — K219 Gastro-esophageal reflux disease without esophagitis: Secondary | ICD-10-CM | POA: Insufficient documentation

## 2018-12-24 DIAGNOSIS — F319 Bipolar disorder, unspecified: Secondary | ICD-10-CM | POA: Insufficient documentation

## 2018-12-24 DIAGNOSIS — K641 Second degree hemorrhoids: Secondary | ICD-10-CM | POA: Diagnosis not present

## 2018-12-24 DIAGNOSIS — R12 Heartburn: Secondary | ICD-10-CM | POA: Diagnosis present

## 2018-12-24 DIAGNOSIS — I10 Essential (primary) hypertension: Secondary | ICD-10-CM | POA: Insufficient documentation

## 2018-12-24 DIAGNOSIS — K635 Polyp of colon: Secondary | ICD-10-CM | POA: Diagnosis not present

## 2018-12-24 DIAGNOSIS — G40909 Epilepsy, unspecified, not intractable, without status epilepticus: Secondary | ICD-10-CM | POA: Diagnosis not present

## 2018-12-24 DIAGNOSIS — D126 Benign neoplasm of colon, unspecified: Secondary | ICD-10-CM | POA: Diagnosis not present

## 2018-12-24 DIAGNOSIS — K625 Hemorrhage of anus and rectum: Secondary | ICD-10-CM | POA: Diagnosis not present

## 2018-12-24 DIAGNOSIS — K208 Other esophagitis without bleeding: Secondary | ICD-10-CM | POA: Diagnosis not present

## 2018-12-24 DIAGNOSIS — K2970 Gastritis, unspecified, without bleeding: Secondary | ICD-10-CM | POA: Diagnosis not present

## 2018-12-24 DIAGNOSIS — K2100 Gastro-esophageal reflux disease with esophagitis, without bleeding: Secondary | ICD-10-CM | POA: Diagnosis not present

## 2018-12-24 DIAGNOSIS — D123 Benign neoplasm of transverse colon: Secondary | ICD-10-CM | POA: Diagnosis not present

## 2018-12-24 HISTORY — DX: Unspecified dementia, unspecified severity, without behavioral disturbance, psychotic disturbance, mood disturbance, and anxiety: F03.90

## 2018-12-24 HISTORY — PX: COLONOSCOPY WITH PROPOFOL: SHX5780

## 2018-12-24 HISTORY — DX: Headache, unspecified: R51.9

## 2018-12-24 HISTORY — PX: ESOPHAGOGASTRODUODENOSCOPY (EGD) WITH PROPOFOL: SHX5813

## 2018-12-24 SURGERY — ESOPHAGOGASTRODUODENOSCOPY (EGD) WITH PROPOFOL
Anesthesia: General

## 2018-12-24 MED ORDER — PROPOFOL 10 MG/ML IV BOLUS
INTRAVENOUS | Status: AC
  Filled 2018-12-24: qty 20

## 2018-12-24 MED ORDER — SODIUM CHLORIDE 0.9 % IV SOLN
INTRAVENOUS | Status: DC
  Administered 2018-12-24: 14:00:00 via INTRAVENOUS

## 2018-12-24 MED ORDER — PROPOFOL 500 MG/50ML IV EMUL
INTRAVENOUS | Status: DC | PRN
  Administered 2018-12-24: 150 ug/kg/min via INTRAVENOUS

## 2018-12-24 MED ORDER — IPRATROPIUM-ALBUTEROL 0.5-2.5 (3) MG/3ML IN SOLN
RESPIRATORY_TRACT | Status: AC
  Administered 2018-12-24: 14:00:00 3 mL
  Filled 2018-12-24: qty 3

## 2018-12-24 MED ORDER — LIDOCAINE HCL (PF) 2 % IJ SOLN
INTRAMUSCULAR | Status: DC | PRN
  Administered 2018-12-24: 100 mg via INTRADERMAL

## 2018-12-24 MED ORDER — PROPOFOL 10 MG/ML IV BOLUS
INTRAVENOUS | Status: DC | PRN
  Administered 2018-12-24 (×2): 30 mg via INTRAVENOUS
  Administered 2018-12-24: 50 mg via INTRAVENOUS
  Administered 2018-12-24: 20 mg via INTRAVENOUS

## 2018-12-24 MED ORDER — LIDOCAINE HCL (PF) 2 % IJ SOLN
INTRAMUSCULAR | Status: AC
  Filled 2018-12-24: qty 10

## 2018-12-24 MED ORDER — IPRATROPIUM-ALBUTEROL 0.5-2.5 (3) MG/3ML IN SOLN: 3.0000 mL | Freq: Four times a day (QID) | RESPIRATORY_TRACT | Status: DC

## 2018-12-24 MED ORDER — EPHEDRINE SULFATE 50 MG/ML IJ SOLN
INTRAMUSCULAR | Status: DC | PRN
  Administered 2018-12-24: 15 mg via INTRAVENOUS

## 2018-12-24 NOTE — Anesthesia Post-op Follow-up Note (Signed)
Anesthesia QCDR form completed.        

## 2018-12-24 NOTE — Interval H&P Note (Signed)
History and Physical Interval Note:  12/24/2018 1:44 PM  [name]  has presented today for surgery, with the diagnosis of NAUSEA,GERD,RECTAL BLEEDING.  The various methods of treatment have been discussed with the patient and family. After consideration of risks, benefits and other options for treatment, the patient has consented to  Procedure(s): ESOPHAGOGASTRODUODENOSCOPY (EGD) WITH PROPOFOL (N/A) COLONOSCOPY WITH PROPOFOL (N/A) as a surgical intervention.  The patient's history has been reviewed, patient examined, no change in status, stable for surgery.  I have reviewed the patient's chart and labs.  Questions were answered to the patient's satisfaction.     [place], [place]

## 2018-12-24 NOTE — Transfer of Care (Signed)
Immediate Anesthesia Transfer of Care Note  Patient: [name]  Procedure(s) Performed: ESOPHAGOGASTRODUODENOSCOPY (EGD) WITH PROPOFOL (N/A ) COLONOSCOPY WITH PROPOFOL (N/A )  Patient Location: PACU  Anesthesia Type:General  Level of Consciousness: awake and drowsy  Airway & Oxygen Therapy: Patient Spontanous Breathing and Patient connected to face mask oxygen  Post-op Assessment: Report given to RN and Post -op Vital signs reviewed and stable  Post vital signs: Reviewed and stable  Last Vitals:  Vitals Value Taken Time  BP    Temp    Pulse    Resp 24 12/24/18 1504  SpO2      Last Pain: There were no vitals filed for this visit.       Complications: No apparent anesthesia complications

## 2018-12-24 NOTE — Op Note (Signed)
[facility] Gastroenterology Patient Name: [name] Procedure Date: 12/24/2018 2:22 PM MRN: [id number] Account #: [id number] Date of Birth: [birth year] Admit Type: Outpatient Age: 48 Room: [facility] ENDO ROOM 2 Gender: Male Note Status: Finalized Procedure:            Upper GI endoscopy Indications:          Heartburn, Nausea Providers:            Benay Pike. Alice Reichert MD, MD Referring MD:         Dion Body (Referring MD) Medicines:            Propofol per Anesthesia Complications:        No immediate complications. Procedure:            Pre-Anesthesia Assessment:                       - The risks and benefits of the procedure and the                        sedation options and risks were discussed with the                        patient. All questions were answered and informed                        consent was obtained.                       - Patient identification and proposed procedure were                        verified prior to the procedure by the nurse. The                        procedure was verified in the procedure room.                       - ASA Grade Assessment: III - A patient with severe                        systemic disease.                       - After reviewing the risks and benefits, the patient                        was deemed in satisfactory condition to undergo the                        procedure.                       After obtaining informed consent, the endoscope was                        passed under direct vision. Throughout the procedure,                        the patient's blood pressure, pulse, and oxygen  saturations were monitored continuously. The Endoscope                        was introduced through the mouth, and advanced to the                        third part of duodenum. The upper GI endoscopy was                        accomplished without difficulty. The patient tolerated                 the procedure well. Findings:      Mucosal changes including feline appearance and white specks were found       in the entire esophagus. Esophageal findings were graded using the       Eosinophilic Esophagitis Endoscopic Reference Score (EoE-EREFS) as:       Edema Grade 0 Normal (distinct vascular markings), Rings Grade 1 Mild       (subtle circumferential ridges seen on esophageal distension), Exudates       Grade 1 Mild (scattered white lesions involving less than 10 percent of       the esophageal surface area), Furrows Grade 0 None (no vertical lines       seen) and Stricture none (no stricture found). Biopsies were obtained       from the proximal and distal esophagus with cold forceps for histology       of suspected eosinophilic esophagitis.      The Z-line was regular and was found at the gastroesophageal junction.      Localized mild inflammation characterized by erosions and erythema was       found in the gastric antrum. Biopsies were taken with a cold forceps for       Helicobacter pylori testing.      The cardia and gastric fundus were normal on retroflexion.      The examined duodenum was normal. Impression:           - Esophageal mucosal changes suspicious for                        eosinophilic esophagitis. Biopsied.                       - Z-line regular, at the gastroesophageal junction.                       - Gastritis. Biopsied.                       - Normal examined duodenum. Recommendation:       - Await pathology results.                       - Proceed with colonoscopy Procedure Code(s):    --- Professional ---                       [phone], Esophagogastroduodenoscopy, flexible, transoral;                        with biopsy, single or multiple Diagnosis Code(s):    --- Professional ---  R11.0, Nausea                       R12, Heartburn                       K29.70, Gastritis, unspecified, without bleeding                        K22.8, Other specified diseases of esophagus CPT copyright 2019 American Medical Association. All rights reserved. The codes documented in this report are preliminary and upon coder review may  be revised to meet current compliance requirements. Efrain Sella MD, MD 12/24/2018 2:38:49 PM This report has been signed electronically. Number of Addenda: 0 Note Initiated On: 12/24/2018 2:22 PM Estimated Blood Loss: Estimated blood loss: none.      [facility]

## 2018-12-24 NOTE — Op Note (Addendum)
[facility] Gastroenterology Patient Name: Shail Hartunian Procedure Date: 12/24/2018 2:22 PM MRN: [id number] Account #: [id number] Date of Birth: March 02, 1971 Admit Type: Outpatient Age: 48 Room: [facility] ENDO ROOM 2 Gender: Male Note Status: Finalized Procedure:            Colonoscopy Indications:          Rectal bleeding Providers:            Benay Pike. Alice Reichert MD, MD Referring MD:         Dion Body (Referring MD) Medicines:            Propofol per Anesthesia Complications:        No immediate complications. Procedure:            Pre-Anesthesia Assessment:                       - The risks and benefits of the procedure and the                        sedation options and risks were discussed with the                        patient. All questions were answered and informed                        consent was obtained.                       - Patient identification and proposed procedure were                        verified prior to the procedure by the nurse. The                        procedure was verified in the procedure room.                       - ASA Grade Assessment: III - A patient with severe                        systemic disease.                       - After reviewing the risks and benefits, the patient                        was deemed in satisfactory condition to undergo the                        procedure.                       After obtaining informed consent, the colonoscope was                        passed under direct vision. Throughout the procedure,                        the patient's blood pressure, pulse, and oxygen                        saturations  were monitored continuously. The                        Colonoscope was introduced through the anus and                        advanced to the the cecum, identified by appendiceal                        orifice and ileocecal valve. The colonoscopy was                        performed  without difficulty. The patient tolerated the                        procedure well. The quality of the bowel preparation                        was good except the sigmoid colon was fair. The                        ileocecal valve, appendiceal orifice, and rectum were                        photographed. Findings:      The perianal and digital rectal examinations were normal. Pertinent       negatives include normal sphincter tone and no palpable rectal lesions.      Many small-mouthed diverticula were found in the sigmoid colon.      A 6 mm polyp was found in the proximal ascending colon. The polyp was       sessile. The polyp was removed with a cold snare. Resection and       retrieval were complete.      A 9 mm polyp was found in the transverse colon. The polyp was sessile.       The polyp was removed with a hot snare. Resection and retrieval were       complete. To prevent bleeding after the polypectomy, one hemostatic clip       was successfully placed (MR conditional). There was no bleeding during,       or at the end, of the procedure.      Non-bleeding internal hemorrhoids were found during retroflexion. The       hemorrhoids were Grade II (internal hemorrhoids that prolapse but reduce       spontaneously).      The exam was otherwise without abnormality. Impression:           - Diverticulosis in the sigmoid colon.                       - One 6 mm polyp in the proximal ascending colon,                        removed with a cold snare. Resected and retrieved.                       - One 9 mm polyp in the transverse colon, removed with                        a hot  snare. Resected and retrieved. Clip (MR                        conditional) was placed.                       - Non-bleeding internal hemorrhoids.                       - The examination was otherwise normal. Recommendation:       - Patient has a contact number available for                        emergencies. The signs  and symptoms of potential                        delayed complications were discussed with the patient.                        Return to normal activities tomorrow. Written discharge                        instructions were provided to the patient.                       - Resume previous diet.                       - Continue present medications.                       - Repeat colonoscopy is recommended for surveillance.                        The colonoscopy date will be determined after pathology                        results from today's exam become available for review.                       - Return to physician assistant in 3 months.                       - Await pathology results from EGD, also performed                        today.                       - The findings and recommendations were discussed with                        the patient. Procedure Code(s):    --- Professional ---                       [phone], Colonoscopy, flexible; with removal of tumor(s),                        polyp(s), or other lesion(s) by snare technique Diagnosis Code(s):    --- Professional ---  K57.30, Diverticulosis of large intestine without                        perforation or abscess without bleeding                       K62.5, Hemorrhage of anus and rectum                       K63.5, Polyp of colon                       K64.1, Second degree hemorrhoids CPT copyright 2019 [employer]. All rights reserved. The codes documented in this report are preliminary and upon coder review may  be revised to meet current compliance requirements. Efrain Sella MD, MD 12/24/2018 3:03:27 PM This report has been signed electronically. Number of Addenda: 0 Note Initiated On: 12/24/2018 2:22 PM Scope Withdrawal Time: 0 hours 11 minutes 23 seconds  Total Procedure Duration: 0 hours 17 minutes 38 seconds  Estimated Blood Loss: Estimated blood loss: none.       [facility]

## 2018-12-24 NOTE — Anesthesia Postprocedure Evaluation (Signed)
Anesthesia Post Note  Patient: Davelle A Kubicki  Procedure(s) Performed: ESOPHAGOGASTRODUODENOSCOPY (EGD) WITH PROPOFOL (N/A ) COLONOSCOPY WITH PROPOFOL (N/A )  Patient location during evaluation: Endoscopy Anesthesia Type: General Level of consciousness: awake and alert and oriented Pain management: pain level controlled Vital Signs Assessment: post-procedure vital signs reviewed and stable Respiratory status: spontaneous breathing, nonlabored ventilation and respiratory function stable Cardiovascular status: blood pressure returned to baseline and stable Postop Assessment: no signs of nausea or vomiting Anesthetic complications: no     Last Vitals:  Vitals:   12/24/18 1513 12/24/18 1523  BP: 108/70 98/72  Resp:    Temp:    SpO2: 94%     Last Pain:  Vitals:   12/24/18 1523  TempSrc:   PainSc: 6                  Helaina Stefano
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
## 2018-12-25 ENCOUNTER — Encounter: Payer: Self-pay | Admitting: Internal Medicine
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
## 2018-12-27 LAB — SURGICAL PATHOLOGY

## 2019-01-21 ENCOUNTER — Emergency Department: Payer: Medicare HMO

## 2019-01-21 ENCOUNTER — Inpatient Hospital Stay
Admission: EM | Admit: 2019-01-21 | Discharge: 2019-01-24 | DRG: 190 | Disposition: A | Discharge status: Home / Self Care | Payer: Medicare HMO | Attending: Internal Medicine | Admitting: Internal Medicine

## 2019-01-21 ENCOUNTER — Other Ambulatory Visit: Payer: Self-pay

## 2019-01-21 DIAGNOSIS — Z87828 Personal history of other (healed) physical injury and trauma: Secondary | ICD-10-CM

## 2019-01-21 DIAGNOSIS — F319 Bipolar disorder, unspecified: Secondary | ICD-10-CM | POA: Diagnosis not present

## 2019-01-21 DIAGNOSIS — J440 Chronic obstructive pulmonary disease with acute lower respiratory infection: Secondary | ICD-10-CM | POA: Diagnosis not present

## 2019-01-21 DIAGNOSIS — T380X5A Adverse effect of glucocorticoids and synthetic analogues, initial encounter: Secondary | ICD-10-CM | POA: Diagnosis not present

## 2019-01-21 DIAGNOSIS — J9601 Acute respiratory failure with hypoxia: Secondary | ICD-10-CM | POA: Diagnosis present

## 2019-01-21 DIAGNOSIS — J209 Acute bronchitis, unspecified: Secondary | ICD-10-CM | POA: Diagnosis not present

## 2019-01-21 DIAGNOSIS — F411 Generalized anxiety disorder: Secondary | ICD-10-CM | POA: Diagnosis present

## 2019-01-21 DIAGNOSIS — I10 Essential (primary) hypertension: Secondary | ICD-10-CM

## 2019-01-21 DIAGNOSIS — F410 Panic disorder [episodic paroxysmal anxiety] without agoraphobia: Secondary | ICD-10-CM | POA: Diagnosis present

## 2019-01-21 DIAGNOSIS — Z23 Encounter for immunization: Secondary | ICD-10-CM | POA: Diagnosis not present

## 2019-01-21 DIAGNOSIS — F17290 Nicotine dependence, other tobacco product, uncomplicated: Secondary | ICD-10-CM | POA: Diagnosis present

## 2019-01-21 DIAGNOSIS — Z888 Allergy status to other drugs, medicaments and biological substances status: Secondary | ICD-10-CM | POA: Diagnosis not present

## 2019-01-21 DIAGNOSIS — Z811 Family history of alcohol abuse and dependence: Secondary | ICD-10-CM | POA: Diagnosis not present

## 2019-01-21 DIAGNOSIS — J80 Acute respiratory distress syndrome: Secondary | ICD-10-CM | POA: Diagnosis not present

## 2019-01-21 DIAGNOSIS — Z818 Family history of other mental and behavioral disorders: Secondary | ICD-10-CM

## 2019-01-21 DIAGNOSIS — Z20828 Contact with and (suspected) exposure to other viral communicable diseases: Secondary | ICD-10-CM | POA: Diagnosis present

## 2019-01-21 DIAGNOSIS — R0789 Other chest pain: Secondary | ICD-10-CM | POA: Diagnosis not present

## 2019-01-21 DIAGNOSIS — R000 Tachycardia, unspecified: Secondary | ICD-10-CM | POA: Diagnosis not present

## 2019-01-21 DIAGNOSIS — R0689 Other abnormalities of breathing: Secondary | ICD-10-CM | POA: Diagnosis not present

## 2019-01-21 DIAGNOSIS — F0390 Unspecified dementia without behavioral disturbance: Secondary | ICD-10-CM | POA: Diagnosis present

## 2019-01-21 DIAGNOSIS — Z79899 Other long term (current) drug therapy: Secondary | ICD-10-CM

## 2019-01-21 DIAGNOSIS — Z9049 Acquired absence of other specified parts of digestive tract: Secondary | ICD-10-CM

## 2019-01-21 DIAGNOSIS — R739 Hyperglycemia, unspecified: Secondary | ICD-10-CM | POA: Diagnosis not present

## 2019-01-21 DIAGNOSIS — R079 Chest pain, unspecified: Secondary | ICD-10-CM | POA: Diagnosis not present

## 2019-01-21 DIAGNOSIS — J441 Chronic obstructive pulmonary disease with (acute) exacerbation: Secondary | ICD-10-CM | POA: Diagnosis not present

## 2019-01-21 DIAGNOSIS — G8929 Other chronic pain: Secondary | ICD-10-CM | POA: Diagnosis present

## 2019-01-21 DIAGNOSIS — G40909 Epilepsy, unspecified, not intractable, without status epilepticus: Secondary | ICD-10-CM | POA: Diagnosis present

## 2019-01-21 DIAGNOSIS — E876 Hypokalemia: Secondary | ICD-10-CM | POA: Diagnosis not present

## 2019-01-21 DIAGNOSIS — K219 Gastro-esophageal reflux disease without esophagitis: Secondary | ICD-10-CM | POA: Diagnosis present

## 2019-01-21 DIAGNOSIS — R0602 Shortness of breath: Secondary | ICD-10-CM | POA: Diagnosis not present

## 2019-01-21 DIAGNOSIS — R069 Unspecified abnormalities of breathing: Secondary | ICD-10-CM | POA: Diagnosis not present

## 2019-01-21 LAB — CBC
HCT: 41.1 % (ref 39.0–52.0)
Hemoglobin: 13.5 g/dL (ref 13.0–17.0)
MCH: 28.1 pg (ref 26.0–34.0)
MCHC: 32.8 g/dL (ref 30.0–36.0)
MCV: 85.6 fL (ref 80.0–100.0)
Platelets: 288 10*3/uL (ref 150–400)
RBC: 4.8 MIL/uL (ref 4.22–5.81)
RDW: 13.2 % (ref 11.5–15.5)
WBC: 15.6 10*3/uL — ABNORMAL HIGH (ref 4.0–10.5)
nRBC: 0 % (ref 0.0–0.2)

## 2019-01-21 LAB — BASIC METABOLIC PANEL
Anion gap: 15 (ref 5–15)
BUN: 14 mg/dL (ref 6–20)
CO2: 23 mmol/L (ref 22–32)
Calcium: 9.3 mg/dL (ref 8.9–10.3)
Chloride: 103 mmol/L (ref 98–111)
Creatinine, Ser: 0.95 mg/dL (ref 0.61–1.24)
GFR calc Af Amer: >60 mL/min (ref >60)
GFR calc non Af Amer: >60 mL/min (ref >60)
Glucose, Bld: 108 mg/dL — ABNORMAL HIGH (ref 70–99)
Potassium: 3.2 mmol/L — ABNORMAL LOW (ref 3.5–5.1)
Sodium: 141 mmol/L (ref 135–145)

## 2019-01-21 LAB — BRAIN NATRIURETIC PEPTIDE: B Natriuretic Peptide: 25 pg/mL (ref 0.0–100.0)

## 2019-01-21 LAB — TROPONIN I (HIGH SENSITIVITY): Troponin I (High Sensitivity): 5 ng/L (ref <18)

## 2019-01-21 MED ORDER — PANTOPRAZOLE SODIUM 40 MG PO TBEC
40.0000 mg | DELAYED_RELEASE_TABLET | Freq: Every day | ORAL | Status: DC
  Administered 2019-01-22 – 2019-01-24 (×3): 40 mg via ORAL
  Filled 2019-01-21 (×3): qty 1

## 2019-01-21 MED ORDER — LORATADINE 10 MG PO TABS
10.0000 mg | ORAL_TABLET | Freq: Every day | ORAL | Status: DC
  Administered 2019-01-22 – 2019-01-24 (×3): 10 mg via ORAL
  Filled 2019-01-21 (×3): qty 1

## 2019-01-21 MED ORDER — METHYLPREDNISOLONE SODIUM SUCC 125 MG IJ SOLR
60.0000 mg | Freq: Three times a day (TID) | INTRAMUSCULAR | Status: DC
  Administered 2019-01-21 – 2019-01-24 (×8): 60 mg via INTRAVENOUS
  Filled 2019-01-21 (×3): qty 2
  Filled 2019-01-21: qty 0.96
  Filled 2019-01-21 (×5): qty 2

## 2019-01-21 MED ORDER — IPRATROPIUM-ALBUTEROL 0.5-2.5 (3) MG/3ML IN SOLN
3.0000 mL | Freq: Once | RESPIRATORY_TRACT | Status: AC
  Administered 2019-01-21: 3 mL via RESPIRATORY_TRACT
  Filled 2019-01-21: qty 3

## 2019-01-21 MED ORDER — POTASSIUM CHLORIDE CRYS ER 20 MEQ PO TBCR
40.0000 meq | EXTENDED_RELEASE_TABLET | Freq: Once | ORAL | Status: AC
  Administered 2019-01-21: 40 meq via ORAL
  Filled 2019-01-21: qty 2

## 2019-01-21 MED ORDER — ALBUTEROL SULFATE (2.5 MG/3ML) 0.083% IN NEBU
2.5000 mg | INHALATION_SOLUTION | Freq: Once | RESPIRATORY_TRACT | Status: AC
  Administered 2019-01-21: 2.5 mg via RESPIRATORY_TRACT
  Filled 2019-01-21: qty 3

## 2019-01-21 MED ORDER — IOHEXOL 350 MG/ML SOLN
75.0000 mL | Freq: Once | INTRAVENOUS | Status: AC | PRN
  Administered 2019-01-21: 23:00:00 75 mL via INTRAVENOUS

## 2019-01-21 MED ORDER — LAMOTRIGINE 25 MG PO TABS
200.0000 mg | ORAL_TABLET | Freq: Every day | ORAL | Status: DC
  Administered 2019-01-22 – 2019-01-24 (×3): 200 mg via ORAL
  Filled 2019-01-21 (×2): qty 8
  Filled 2019-01-21: qty 2

## 2019-01-21 MED ORDER — ENOXAPARIN SODIUM 40 MG/0.4ML ~~LOC~~ SOLN
40.0000 mg | Freq: Every day | SUBCUTANEOUS | Status: DC
  Administered 2019-01-22 – 2019-01-23 (×3): 40 mg via SUBCUTANEOUS
  Filled 2019-01-21 (×4): qty 0.4

## 2019-01-21 MED ORDER — ONDANSETRON HCL 4 MG/2ML IJ SOLN: 4.0000 mg | Freq: Four times a day (QID) | INTRAMUSCULAR | Status: DC | PRN

## 2019-01-21 MED ORDER — ACETAMINOPHEN 650 MG RE SUPP: 650.0000 mg | Freq: Four times a day (QID) | RECTAL | Status: DC | PRN

## 2019-01-21 MED ORDER — IPRATROPIUM-ALBUTEROL 0.5-2.5 (3) MG/3ML IN SOLN
3.0000 mL | Freq: Four times a day (QID) | RESPIRATORY_TRACT | Status: DC
  Administered 2019-01-22 – 2019-01-24 (×8): 3 mL via RESPIRATORY_TRACT
  Filled 2019-01-21 (×8): qty 3

## 2019-01-21 MED ORDER — SODIUM CHLORIDE 0.9 % IV SOLN
INTRAVENOUS | Status: DC
  Administered 2019-01-21 – 2019-01-22 (×2): via INTRAVENOUS

## 2019-01-21 MED ORDER — METHYLPREDNISOLONE SODIUM SUCC 125 MG IJ SOLR
125.0000 mg | INTRAMUSCULAR | Status: AC
  Administered 2019-01-21: 125 mg via INTRAVENOUS
  Filled 2019-01-21: qty 2

## 2019-01-21 MED ORDER — ACETAMINOPHEN 325 MG PO TABS
650.0000 mg | ORAL_TABLET | Freq: Four times a day (QID) | ORAL | Status: DC | PRN
  Administered 2019-01-22: 650 mg via ORAL
  Filled 2019-01-21: qty 2

## 2019-01-21 MED ORDER — TRAZODONE HCL 50 MG PO TABS
150.0000 mg | ORAL_TABLET | Freq: Every evening | ORAL | Status: DC | PRN
  Administered 2019-01-22 – 2019-01-23 (×2): 150 mg via ORAL
  Filled 2019-01-21 (×2): qty 1

## 2019-01-21 MED ORDER — MAGNESIUM HYDROXIDE 400 MG/5ML PO SUSP
30.0000 mL | Freq: Every day | ORAL | Status: DC | PRN
  Administered 2019-01-23: 30 mL via ORAL
  Filled 2019-01-21: qty 30

## 2019-01-21 MED ORDER — ONDANSETRON HCL 4 MG PO TABS
4.0000 mg | ORAL_TABLET | Freq: Four times a day (QID) | ORAL | Status: DC | PRN
  Filled 2019-01-21: qty 1

## 2019-01-21 MED ORDER — QUETIAPINE FUMARATE 25 MG PO TABS
200.0000 mg | ORAL_TABLET | Freq: Every day | ORAL | Status: DC
  Administered 2019-01-22 – 2019-01-23 (×3): 200 mg via ORAL
  Filled 2019-01-21 (×2): qty 1
  Filled 2019-01-21: qty 8

## 2019-01-21 MED ORDER — LISINOPRIL 10 MG PO TABS
10.0000 mg | ORAL_TABLET | Freq: Every day | ORAL | Status: DC
  Administered 2019-01-21 – 2019-01-23 (×3): 10 mg via ORAL
  Filled 2019-01-21 (×3): qty 1

## 2019-01-21 MED ORDER — VITAMIN B-12 1000 MCG PO TABS
1000.0000 ug | ORAL_TABLET | Freq: Every day | ORAL | Status: DC
  Administered 2019-01-22 – 2019-01-24 (×3): 1000 ug via ORAL
  Filled 2019-01-21 (×3): qty 1

## 2019-01-21 MED ORDER — FLUVOXAMINE MALEATE 50 MG PO TABS
100.0000 mg | ORAL_TABLET | Freq: Every day | ORAL | Status: DC
  Administered 2019-01-22 – 2019-01-23 (×3): 100 mg via ORAL
  Filled 2019-01-21 (×5): qty 2

## 2019-01-21 MED ORDER — SODIUM CHLORIDE 0.9 % IV SOLN
1.0000 g | INTRAVENOUS | Status: DC
  Administered 2019-01-21: 1 g via INTRAVENOUS
  Filled 2019-01-21: qty 10

## 2019-01-21 MED ORDER — TRAZODONE HCL 50 MG PO TABS: 25.0000 mg | ORAL_TABLET | Freq: Every evening | ORAL | Status: DC | PRN

## 2019-01-21 MED ORDER — SODIUM CHLORIDE 0.9 % IV SOLN
500.0000 mg | INTRAVENOUS | Status: DC
  Administered 2019-01-22: 500 mg via INTRAVENOUS
  Filled 2019-01-21: qty 500

## 2019-01-21 MED ORDER — ATOMOXETINE HCL 60 MG PO CAPS
60.0000 mg | ORAL_CAPSULE | Freq: Every day | ORAL | Status: DC
  Administered 2019-01-22 – 2019-01-23 (×2): 60 mg via ORAL
  Filled 2019-01-21 (×2): qty 1
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
## 2019-01-21 NOTE — ED Notes (Signed)
Rainbow tubes sent to lab.  

## 2019-01-21 NOTE — ED Notes (Signed)
Patient transported to CT 

## 2019-01-21 NOTE — ED Notes (Signed)
Report given to Amy RN.

## 2019-01-21 NOTE — ED Triage Notes (Signed)
Pt arrives via [facility] from home for [facility] x "days" despite using inhaler. Pt A&0x4. PT receiving albuterol breathing tx at this time and received 2 total in route. 1 solumedrol given IV in route.
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
## 2019-01-21 NOTE — ED Notes (Signed)
Security guard came from the lobby stating pt's sister was here to pick up pt's belongings. When I asked the pt about his belongings he states "I dont like that idea". Advised security guard that pt will not be sending his belongings with his sister because he states he is not comfortable with this.

## 2019-01-22 ENCOUNTER — Other Ambulatory Visit: Payer: Self-pay

## 2019-01-22 LAB — BASIC METABOLIC PANEL
Anion gap: 9 (ref 5–15)
BUN: 13 mg/dL (ref 6–20)
CO2: 23 mmol/L (ref 22–32)
Calcium: 8.8 mg/dL — ABNORMAL LOW (ref 8.9–10.3)
Chloride: 108 mmol/L (ref 98–111)
Creatinine, Ser: 0.9 mg/dL (ref 0.61–1.24)
GFR calc Af Amer: >60 mL/min (ref >60)
GFR calc non Af Amer: >60 mL/min (ref >60)
Glucose, Bld: 181 mg/dL — ABNORMAL HIGH (ref 70–99)
Potassium: 4 mmol/L (ref 3.5–5.1)
Sodium: 140 mmol/L (ref 135–145)

## 2019-01-22 LAB — CBC
HCT: 36.4 % — ABNORMAL LOW (ref 39.0–52.0)
Hemoglobin: 12.2 g/dL — ABNORMAL LOW (ref 13.0–17.0)
MCH: 28.2 pg (ref 26.0–34.0)
MCHC: 33.5 g/dL (ref 30.0–36.0)
MCV: 84.1 fL (ref 80.0–100.0)
Platelets: 249 10*3/uL (ref 150–400)
RBC: 4.33 MIL/uL (ref 4.22–5.81)
RDW: 13.4 % (ref 11.5–15.5)
WBC: 8.7 10*3/uL (ref 4.0–10.5)
nRBC: 0 % (ref 0.0–0.2)

## 2019-01-22 LAB — SARS CORONAVIRUS 2 (TAT 6-24 HRS): SARS Coronavirus 2: NEGATIVE

## 2019-01-22 MED ORDER — PNEUMOCOCCAL VAC POLYVALENT 25 MCG/0.5ML IJ INJ
0.5000 mL | INJECTION | INTRAMUSCULAR | Status: AC
  Administered 2019-01-24: 11:00:00 0.5 mL via INTRAMUSCULAR
  Filled 2019-01-22: qty 0.5

## 2019-01-22 MED ORDER — AZITHROMYCIN 250 MG PO TABS: 250.0000 mg | ORAL_TABLET | Freq: Every day | ORAL | Status: DC

## 2019-01-22 MED ORDER — INFLUENZA VAC SPLIT QUAD 0.5 ML IM SUSY
0.5000 mL | PREFILLED_SYRINGE | INTRAMUSCULAR | Status: AC
  Administered 2019-01-24: 0.5 mL via INTRAMUSCULAR
  Filled 2019-01-22: qty 0.5

## 2019-01-22 NOTE — ED Notes (Addendum)
Report off to megan rn. 

## 2019-01-22 NOTE — ED Notes (Signed)
meds given.   Sinus tach on monitor.  Pt in no acute distress.  Pt alert

## 2019-01-22 NOTE — ED Notes (Signed)
Pt given meal tray at this time 

## 2019-01-22 NOTE — ED Notes (Signed)
Pt provided sprite to drink. Requesting something for "his nerves." informed MD.

## 2019-01-22 NOTE — ED Notes (Signed)
Lunch tray provided to pt.

## 2019-01-22 NOTE — ED Notes (Signed)
Called floor for pt's room assignment. Charge RN working on room assignment at this time. Please call back shortly.

## 2019-01-22 NOTE — ED Notes (Signed)
PT resting in bed at this time, O2 sats 90% on RA. Medications administered per MD order. Pt placed on 2L via [place] at this time. Pt denies any needs, 0500 labs drawn by this RN and sent to lab.

## 2019-01-22 NOTE — Progress Notes (Signed)
[place] [facility] [place] NAME: [name]    MR#:  [id number]  DATE OF BIRTH:  1970/07/17  SUBJECTIVE:  came in with increasing shortness of breath and some dry cough on and off for one month. He continues to smoke heavy.  He is been stressed out with social issues at home. Denies any fever chills.  covid neg REVIEW OF SYSTEMS:   Review of Systems  Constitutional: Positive for malaise/fatigue. Negative for chills, fever and weight loss.  HENT: Negative for ear discharge, ear pain and nosebleeds.   Eyes: Negative for blurred vision, pain and discharge.  Respiratory: Positive for cough and shortness of breath. Negative for sputum production, wheezing and stridor.   Cardiovascular: Negative for chest pain, palpitations, orthopnea and PND.  Gastrointestinal: Negative for abdominal pain, diarrhea, nausea and vomiting.  Genitourinary: Negative for frequency and urgency.  Musculoskeletal: Negative for back pain and joint pain.  Neurological: Positive for weakness. Negative for sensory change, speech change and focal weakness.  Psychiatric/Behavioral: Negative for depression and hallucinations. The patient is not nervous/anxious.    Tolerating Diet:yes Tolerating PT: ambulatory  DRUG ALLERGIES:   Allergies  Allergen Reactions  . Amitriptyline Other (See Comments)    Paralysis of pt     VITALS:  Blood pressure 129/81, pulse 99, temperature 98.4 F (36.9 C), temperature source Axillary, resp. rate 15, height 6\' 1"  (1.854 m), weight 98 kg, SpO2 96 %.  PHYSICAL EXAMINATION:   Physical Exam  GENERAL:  48 y.o.-year-old patient lying in the bed with no acute distress. DDisheveled EYES: Pupils equal, round, reactive to light and accommodation. No scleral icterus. Extraocular muscles intact.  HEENT: Head atraumatic, normocephalic. Oropharynx and nasopharynx clear.  NECK:  Supple, no jugular venous distention. No thyroid enlargement, no  tenderness.  LUNGS: distant breath sounds bilaterally, scattered wheezing,no  rales, no rhonchi. No use of accessory muscles of respiration.  CARDIOVASCULAR: S1, S2 normal. No murmurs, rubs, or gallops.  ABDOMEN: Soft, nontender, nondistended. Bowel sounds present. No organomegaly or mass.  EXTREMITIES: No cyanosis, clubbing or edema b/l.    NEUROLOGIC: Cranial nerves II through XII are intact. No focal Motor or sensory deficits b/l.   PSYCHIATRIC:  patient is alert and oriented x 3.  SKIN: No obvious rash, lesion, or ulcer.  Tatto++ both UE LABORATORY PANEL:  CBC Recent Labs  Lab 01/22/19 0542  WBC 8.7  HGB 12.2*  HCT 36.4*  PLT 249    Chemistries  Recent Labs  Lab 01/22/19 0542  NA 140  K 4.0  CL 108  CO2 23  GLUCOSE 181*  BUN 13  CREATININE 0.90  CALCIUM 8.8*   Cardiac Enzymes No results for input(s): TROPONINI in the last 168 hours. RADIOLOGY:  Ct Angio Chest Pe W And/or Wo Contrast  Result Date: 01/21/2019 CLINICAL DATA:  Shortness of breath EXAM: CT ANGIOGRAPHY CHEST WITH CONTRAST TECHNIQUE: Multidetector CT imaging of the chest was performed using the standard protocol during bolus administration of intravenous contrast. Multiplanar CT image reconstructions and MIPs were obtained to evaluate the vascular anatomy. CONTRAST:  47mL OMNIPAQUE IOHEXOL 350 MG/ML SOLN COMPARISON:  03/09/2017 FINDINGS: Cardiovascular: Thoracic aorta is well visualized without evidence of aneurysmal dilatation or dissection. No cardiac enlargement is seen. Scattered coronary calcifications are noted. The pulmonary artery shows a normal branching pattern without focal intraluminal filling defect to suggest pulmonary embolism. Mediastinum/Nodes: Thoracic inlet is within normal limits. No hilar or mediastinal adenopathy is noted. The esophagus as visualized  is within normal limits. Lungs/Pleura: Lungs are well aerated bilaterally. Minimal bibasilar atelectasis is seen in a dependent fashion. Small  nodules are noted 1 on the left along the major fissure best seen on image number 45 of series 6 stable from the prior exam and likely representing a subpleural lymph node. Additionally small subpleural nodules are noted within the right middle lobe on image number 53 and 49 of series 6. These are also stable from the prior exam. No focal confluent infiltrate or effusion is noted. Upper Abdomen: Visualized upper abdomen shows no acute abnormality. Musculoskeletal: Healed right clavicular fracture is noted. Old right rib fractures are noted. No acute bony abnormality is seen. Review of the MIP images confirms the above findings. IMPRESSION: No evidence pulmonary emboli. Stable pulmonary nodules similar to that seen on the prior exam consistent with benign etiology. Mild basilar atelectasis bilaterally. Electronically Signed   By: Inez Catalina M.D.   On: 01/21/2019 23:42   Dg Chest Portable 1 View  Result Date: 01/21/2019 CLINICAL DATA:  Shortness of breath, wheezing. EXAM: PORTABLE CHEST 1 VIEW COMPARISON:  10/03/2018 FINDINGS: Lungs are clear. Cardiomediastinal contours and pulmonary vasculature unremarkable. Signs of remote right clavicular fracture. IMPRESSION: No active disease. Electronically Signed   By: Zetta Bills M.D.   On: 01/21/2019 21:14   ASSESSMENT AND PLAN:    Cem Colucci  is a 48 y.o. Caucasian male with a known history of emphysema, hypertension, seizure disorder, pulmonary nodules, depression and GERD, who presented to the emergency room with acute onset of worsening dyspnea with associated cough productive of clear sputum as well as wheezing over the last month, with no fever or chills.  He denied any chest pain except occasionally with cough.  1.  COPD acute exacerbation likely secondary to acute bronchitis - cont with  IV Solu-Medrol  - nebulized bronchodilator therapy with duonebs q.i.d. and q.4 hours p.r.n., mucolytic therapy with Mucinex  -empiric antibiotic therapy with po   Zithromax given the severity of his exacerbation.  -Sputum Gram stain culture and sensitivity if pt able to sample -O2 protocol will be followed.  We will hold off Breo Ellipta. -assess for home oxygen  2.  Hypokalemia.  Potassium  replaced   3.  Hypertension.  On Lisinopril  4.  Bipolar disorder.   -Seroquel will be continued.  5.  Ongoing tobacco abuse. I counseled the patient for smoking cessation. Pt does not seem to be motivated although did say he will try.  6.  Depression.  Luvox will be continued  7.  Seizure disorder.  Lamictal will be continued.  8.  DVT prophylaxis.  Subcutaneous Lovenox   Anticipate discharge in 1 to 2 days.Assess for home oxygen need prior to discharge  Case discussed with Care Management/Social Worker.  CODE STATUS: FULL  DVT Prophylaxis: Lovenox  TOTAL TIME TAKING CARE OF THIS PATIENT: *30* minutes.  >50% time spent on counselling and coordination of care  POSSIBLE D/C IN *1-2* DAYS, DEPENDING ON CLINICAL CONDITION.  Note: This dictation was prepared with Dragon dictation along with smaller phrase technology. Any transcriptional errors that result from this process are unintentional.  Fritzi Mandes M.D on 01/22/2019 at 2:20 PM  Between 7am to 6pm - Pager - [phone]  After 6pm go to [email or web address]  [facility]   CC: Primary care physician; Dion Body, MDPatient ID: [name], male   DOB: 10-10-1970, 48 y.o.   MRN: [id number]

## 2019-01-22 NOTE — ED Notes (Signed)
Pt resting in bed with NAD noted at this time, pt remains on O2, will continue to monitor for further changes in patient condition. Lights remain dimmed for patient comfort.

## 2019-01-22 NOTE — Progress Notes (Signed)
D: Pt alert and oriented. From home w/SOB. Mother Roert Culligan) at bedside. Pt ambulates in room at [facility].   A: Scheduled medications administered to pt, per MD orders. Support and encouragement provided. Frequent verbal contact made.    R: No adverse drug reactions noted. Pt complaint with medications and treatment plan. Pt interacts well with staff on the unit. Pt is stable at this time, will continue to monitor and provide care as ordered.

## 2019-01-22 NOTE — ED Notes (Signed)
Called floor back and advised RN didn't know she was getting a pt. Advised RN that I could allow 5 minutes before I would have to roll up with pt. RN Nira Conn Charge aware

## 2019-01-23 ENCOUNTER — Encounter: Payer: Self-pay | Admitting: Internal Medicine

## 2019-01-23 DIAGNOSIS — F411 Generalized anxiety disorder: Secondary | ICD-10-CM

## 2019-01-23 MED ORDER — ATOMOXETINE HCL 25 MG PO CAPS
25.0000 mg | ORAL_CAPSULE | Freq: Every day | ORAL | Status: DC
  Administered 2019-01-24: 25 mg via ORAL
  Filled 2019-01-23: qty 1

## 2019-01-23 MED ORDER — AZITHROMYCIN 500 MG PO TABS
500.0000 mg | ORAL_TABLET | Freq: Every day | ORAL | Status: DC
  Administered 2019-01-23 – 2019-01-24 (×2): 500 mg via ORAL
  Filled 2019-01-23 (×2): qty 1

## 2019-01-23 MED ORDER — CLONAZEPAM 0.5 MG PO TABS
0.5000 mg | ORAL_TABLET | Freq: Three times a day (TID) | ORAL | Status: DC | PRN
  Administered 2019-01-23 – 2019-01-24 (×2): 0.5 mg via ORAL
  Filled 2019-01-23 (×2): qty 1

## 2019-01-23 MED ORDER — ALBUTEROL SULFATE (2.5 MG/3ML) 0.083% IN NEBU
2.5000 mg | INHALATION_SOLUTION | Freq: Four times a day (QID) | RESPIRATORY_TRACT | Status: DC | PRN
  Filled 2019-01-23: qty 3

## 2019-01-23 NOTE — Progress Notes (Signed)
Ambulated around unit twice.   Patient Saturations on Room Air at Rest = 97%  Patient Saturations on Room Air while Ambulating = 95%  HR increased to 115 while ambulating

## 2019-01-23 NOTE — TOC Transition Note (Signed)
Transition of Care [facility]) - Initial/Assessment Note    Patient Details  Name: [name] MRN: [id number] Date of Birth: 09/13/1970  [facility] CM/SW Contact:    Su Hilt, RN Phone Number: 01/23/2019, 3:30 PM  Clinical Narrative:                 Patient needs a nebulizer machine to go home, I contacted Brad with [employer] to arrange and one will be brought to the room in a few min.  The patient has no other needs at this time        Patient Goals and CMS Choice        Expected Discharge Plan and Services                                                Prior Living Arrangements/Services                       Activities of Daily Living Home Assistive Devices/Equipment: None ADL Screening (condition at time of admission) Patient's cognitive ability adequate to safely complete daily activities?: Yes Is the patient deaf or have difficulty hearing?: No Does the patient have difficulty seeing, even when wearing glasses/contacts?: No Does the patient have difficulty concentrating, remembering, or making decisions?: Yes Patient able to express need for assistance with ADLs?: Yes Does the patient have difficulty dressing or bathing?: No Independently performs ADLs?: Yes (appropriate for developmental age) Does the patient have difficulty walking or climbing stairs?: Yes(D/T SOB) Weakness of Legs: None Weakness of Arms/Hands: None  Permission Sought/Granted                  Emotional Assessment              Admission diagnosis:  COPD exacerbation ([place]) [J44.1] Patient Active Problem List   Diagnosis Date Noted  . COPD exacerbation ([place]) 01/21/2019  . S/P laparoscopic cholecystectomy   . Postoperative abdominal pain 04/26/2018  . Leukocytosis 04/26/2018  . Acute post-operative pain   . Bright red rectal bleeding 04/24/2018  . GERD (gastroesophageal reflux disease) 04/24/2018  . Tobacco use disorder 12/28/2017  .  Bipolar I disorder, current or most recent episode depressed, with psychotic features ([place]) 12/25/2017  . Suicidal ideation 12/25/2017  . Cannabis use disorder, moderate, dependence ([place]) 12/25/2017  . Medicare annual wellness visit, initial 12/07/2017  . History of normocytic normochromic anemia 02/01/2017  . Borderline diabetes mellitus 08/27/2015  . Combined fat and carbohydrate induced hyperlipemia 08/27/2015  . Anxiety 05/21/2015  . Moderate episode of recurrent major depressive disorder ([place]) 05/21/2015  . Seizure ([place]) 05/21/2015  . Cognitive decline 04/30/2015  . Abdominal distension 02/24/2015  . Hyponatremia 02/24/2015  . HTN (hypertension) 02/24/2015  . Hx of traumatic brain injury 02/24/2015  . HTN (hypertension), benign 02/24/2015  . Abdominal distension (gaseous) 02/24/2015  . Essential (primary) hypertension 02/24/2015  . Hypo-osmolality and hyponatremia 02/24/2015  . Personal history of traumatic brain injury 02/24/2015  . Insomnia, persistent 01/09/2015   PCP:  Dion Body, MD Pharmacy:   [place], [place] - [place] [address] Phone: [phone] Fax: [phone]     Social Determinants of Health (SDOH) Interventions    Readmission Risk Interventions No flowsheet data found.

## 2019-01-23 NOTE — Progress Notes (Addendum)
Progress Note    [name]  [id number] DOB: [birth year]  DOA: 01/21/2019 PCP: Dion Body, MD        Assessment/Plan:   Active Problems:   COPD exacerbation ([place])   Body mass index is 28.1 kg/m.   Acute COPD exacerbation: Continue IV steroids and bronchodilators.  Acute hypoxemic respiratory failure: He has been weaned off of oxygen.  Continue to monitor oxygen saturation on room air.  Anxiety with recurrent panic attacks and history of bipolar disorder and depression: Consulted psychiatrist today and medications have been adjusted.  Clonazepam as needed has been ordered for anxiety. Landry Dyke has been decreased to 25 mg daily.  Continue psychotropics.  Seizure disorder: Continue Lamictal  Hypertension: Continue lisinopril  Hypokalemia: Improved  Hyperglycemia: This is likely from steroids.   Family Communication/Anticipated D/C date and plan/Code Status   DVT prophylaxis: Lovenox Code Status: Full code Family Communication: Plan discussed with his mother at the bedside. Disposition Plan: Possible discharge to home tomorrow      Subjective:   C/o cough, shortness of breath with exertion and recurrent severe panic attacks in the hospital.  He and his mother requested to see the psychiatrist  Objective:    Vitals:   01/23/19 1013 01/23/19 1127 01/23/19 1407 01/23/19 1429  BP: 116/65  (!) 143/92 (!) 143/88  Pulse: 91  (!) 101 (!) 101  Resp:    20  Temp: 97.9 F (36.6 C)   98 F (36.7 C)  TempSrc: Oral   Oral  SpO2: 95% 97% 97% 96%  Weight:      Height:        Intake/Output Summary (Last 24 hours) at 01/23/2019 1752 Last data filed at 01/23/2019 0900 Gross per 24 hour  Intake 240 ml  Output 800 ml  Net -560 ml   Filed Weights   01/21/19 2026 01/22/19 1424 01/22/19 1610  Weight: 98 kg 98 kg 96.6 kg    Exam:  GEN: NAD SKIN: No rash EYES: EOMI ENT: MMM CV: RRR PULM: Decreased air entry bilaterally, significant  bilateral wheezing, no rales heard. ABD: soft, ND, NT, +BS CNS: AAO x 3, non focal EXT: No edema or tenderness PSYCH: Calm but anxious   Data Reviewed:   I have personally reviewed following labs and imaging studies:  Labs: Labs show the following:   Basic Metabolic Panel: Recent Labs  Lab 01/21/19 2038 01/22/19 0542  NA 141 140  K 3.2* 4.0  CL 103 108  CO2 23 23  GLUCOSE 108* 181*  BUN 14 13  CREATININE 0.95 0.90  CALCIUM 9.3 8.8*   GFR Estimated Creatinine Clearance: 124.3 mL/min (by C-G formula based on SCr of 0.9 mg/dL). Liver Function Tests: No results for input(s): AST, ALT, ALKPHOS, BILITOT, PROT, ALBUMIN in the last 168 hours. No results for input(s): LIPASE, AMYLASE in the last 168 hours. No results for input(s): AMMONIA in the last 168 hours. Coagulation profile No results for input(s): INR, PROTIME in the last 168 hours.  CBC: Recent Labs  Lab 01/21/19 2038 01/22/19 0542  WBC 15.6* 8.7  HGB 13.5 12.2*  HCT 41.1 36.4*  MCV 85.6 84.1  PLT 288 249   Cardiac Enzymes: No results for input(s): CKTOTAL, CKMB, CKMBINDEX, TROPONINI in the last 168 hours. BNP (last 3 results) No results for input(s): PROBNP in the last 8760 hours. CBG: No results for input(s): GLUCAP in the last 168 hours. D-Dimer: No results for input(s): DDIMER in the last 72 hours. Hgb  A1c: No results for input(s): HGBA1C in the last 72 hours. Lipid Profile: No results for input(s): CHOL, HDL, LDLCALC, TRIG, CHOLHDL, LDLDIRECT in the last 72 hours. Thyroid function studies: No results for input(s): TSH, T4TOTAL, T3FREE, THYROIDAB in the last 72 hours.  Invalid input(s): FREET3 Anemia work up: No results for input(s): VITAMINB12, FOLATE, FERRITIN, TIBC, IRON, RETICCTPCT in the last 72 hours. Sepsis Labs: Recent Labs  Lab 01/21/19 2038 01/22/19 0542  WBC 15.6* 8.7    Microbiology Recent Results (from the past 240 hour(s))  SARS CORONAVIRUS 2 (TAT 6-24 HRS) Nasopharyngeal  Nasopharyngeal Swab     Status: None   Collection Time: 01/21/19  8:35 PM   Specimen: Nasopharyngeal Swab  Result Value Ref Range Status   SARS Coronavirus 2 NEGATIVE NEGATIVE Final    Comment: (NOTE) SARS-CoV-2 target nucleic acids are NOT DETECTED. The SARS-CoV-2 RNA is generally detectable in upper and lower respiratory specimens during the acute phase of infection. Negative results do not preclude SARS-CoV-2 infection, do not rule out co-infections with other pathogens, and should not be used as the sole basis for treatment or other patient management decisions. Negative results must be combined with clinical observations, patient history, and epidemiological information. The expected result is Negative. Fact Sheet for Patients: SugarRoll.be Fact Sheet for Healthcare Providers: https://www.woods-mathews.com/ This test is not yet approved or cleared by the [place] FDA and  has been authorized for detection and/or diagnosis of SARS-CoV-2 by FDA under an Emergency Use Authorization (EUA). This EUA will remain  in effect (meaning this test can be used) for the duration of the COVID-19 declaration under Section 56 4(b)(1) of the Act, 21 U.S.C. section 360bbb-3(b)(1), unless the authorization is terminated or revoked sooner. Performed at [facility], [place] [address] [place], [place] [address]     Procedures and diagnostic studies:  Ct Angio Chest Pe W And/or Wo Contrast  Result Date: 01/21/2019 CLINICAL DATA:  Shortness of breath EXAM: CT ANGIOGRAPHY CHEST WITH CONTRAST TECHNIQUE: Multidetector CT imaging of the chest was performed using the standard protocol during bolus administration of intravenous contrast. Multiplanar CT image reconstructions and MIPs were obtained to evaluate the vascular anatomy. CONTRAST:  72mL OMNIPAQUE IOHEXOL 350 MG/ML SOLN COMPARISON:  03/09/2017 FINDINGS: Cardiovascular: Thoracic aorta is well  visualized without evidence of aneurysmal dilatation or dissection. No cardiac enlargement is seen. Scattered coronary calcifications are noted. The pulmonary artery shows a normal branching pattern without focal intraluminal filling defect to suggest pulmonary embolism. Mediastinum/Nodes: Thoracic inlet is within normal limits. No hilar or mediastinal adenopathy is noted. The esophagus as visualized is within normal limits. Lungs/Pleura: Lungs are well aerated bilaterally. Minimal bibasilar atelectasis is seen in a dependent fashion. Small nodules are noted 1 on the left along the major fissure best seen on image number 45 of series 6 stable from the prior exam and likely representing a subpleural lymph node. Additionally small subpleural nodules are noted within the right middle lobe on image number 53 and 49 of series 6. These are also stable from the prior exam. No focal confluent infiltrate or effusion is noted. Upper Abdomen: Visualized upper abdomen shows no acute abnormality. Musculoskeletal: Healed right clavicular fracture is noted. Old right rib fractures are noted. No acute bony abnormality is seen. Review of the MIP images confirms the above findings. IMPRESSION: No evidence pulmonary emboli. Stable pulmonary nodules similar to that seen on the prior exam consistent with benign etiology. Mild basilar atelectasis bilaterally. Electronically Signed   By: Elta Guadeloupe  Lukens M.D.   On: 01/21/2019 23:42   Dg Chest Portable 1 View  Result Date: 01/21/2019 CLINICAL DATA:  Shortness of breath, wheezing. EXAM: PORTABLE CHEST 1 VIEW COMPARISON:  10/03/2018 FINDINGS: Lungs are clear. Cardiomediastinal contours and pulmonary vasculature unremarkable. Signs of remote right clavicular fracture. IMPRESSION: No active disease. Electronically Signed   By: Zetta Bills M.D.   On: 01/21/2019 21:14    Medications:   . [START ON 01/24/2019] atomoxetine  25 mg Oral Daily  . azithromycin  500 mg Oral Daily  .  enoxaparin (LOVENOX) injection  40 mg Subcutaneous QHS  . fluvoxaMINE  100 mg Oral QHS  . influenza vac split quadrivalent PF  0.5 mL Intramuscular Tomorrow-1000  . ipratropium-albuterol  3 mL Nebulization QID  . lamoTRIgine  200 mg Oral Daily  . lisinopril  10 mg Oral QHS  . loratadine  10 mg Oral Daily  . methylPREDNISolone (SOLU-MEDROL) injection  60 mg Intravenous Q8H  . pantoprazole  40 mg Oral Daily  . pneumococcal 23 valent vaccine  0.5 mL Intramuscular Tomorrow-1000  . QUEtiapine  200 mg Oral QHS  . vitamin B-12  1,000 mcg Oral Daily   Continuous Infusions:   LOS: 2 days   Daimion Adamcik  [facility] Pager [phone].   *Please refer to [email or web address], password [email or web address] to get updated schedule on who will round on this patient, as hospitalists switch teams weekly. If 7PM-7AM, please contact night-coverage at [email or web address], password [email or web address] for any overnight needs.  01/23/2019, 5:52 PM

## 2019-01-23 NOTE — Progress Notes (Signed)
mom called twice would like to speak to MD and request son to have psych consult. says he called her over night and this morning and is having panic attacks . he sees psych MD when at home. Notified MD Mal Misty
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
## 2019-01-24 DIAGNOSIS — Z23 Encounter for immunization: Secondary | ICD-10-CM | POA: Diagnosis not present

## 2019-01-24 MED ORDER — IPRATROPIUM-ALBUTEROL 0.5-2.5 (3) MG/3ML IN SOLN: 3.0000 mL | Freq: Four times a day (QID) | RESPIRATORY_TRACT | 0 refills | Status: DC | PRN

## 2019-01-24 MED ORDER — ATOMOXETINE HCL 25 MG PO CAPS: 25.0000 mg | ORAL_CAPSULE | Freq: Every day | ORAL | 0 refills | Status: DC

## 2019-01-24 MED ORDER — IPRATROPIUM-ALBUTEROL 0.5-2.5 (3) MG/3ML IN SOLN: 3.0000 mL | Freq: Three times a day (TID) | RESPIRATORY_TRACT | Status: DC

## 2019-01-24 MED ORDER — CLONAZEPAM 0.5 MG PO TABS: 0.5000 mg | ORAL_TABLET | Freq: Two times a day (BID) | ORAL | 0 refills | Status: DC | PRN

## 2019-01-24 MED ORDER — PREDNISONE 20 MG PO TABS: 40.0000 mg | ORAL_TABLET | Freq: Every day | ORAL | 0 refills | Status: AC

## 2019-01-24 NOTE — Discharge Summary (Signed)
Physician Discharge Summary  [name] [id number] DOB: 1971/01/05 DOA: 01/21/2019  PCP: Dion Body, MD  Admit date: 01/21/2019 Discharge date: 01/24/2019  Discharge disposition: Home   Recommendations for Outpatient Follow-Up:   Outpatient follow-up with his PCP and psychiatrist was strongly recommended.   Discharge Diagnosis:   Active Problems:   COPD exacerbation (HCC)   Generalized anxiety disorder    Discharge Condition: Stable.  Diet recommendation: Low sodium, heart healthy.  Code status: Full.    Hospital Course:   [name] is a 48 year old man with history of COPD, hypertension, seizure disorder, pulmonary nodules, depression, anxiety, GERD.  He presented to the hospital because of increasing shortness of breath, productive cough and wheezing.  He was admitted to the hospital for acute COPD exacerbation and acute hypoxemic respiratory failure.  He was treated with oxygen, steroids and bronchodilators.  His condition has improved and he has been successfully weaned off of oxygen.  He has been given a prescription for nebulizer for home use. While on admission, patient had recurrent episodes of panic attacks and requested psychiatric consult.  He was seen in consultation by the psychiatrist and clonazepam was prescribed.  He was on atomoxetine and dose was reduced to 60 mg to 25 mg daily.   Overall, his condition has improved and he is deemed stable for discharge to home.  Discharge plan was discussed with the patient and his mother at the bedside.    Medical Consultants:    None.   Discharge Exam:   Vitals:   01/24/19 0442 01/24/19 0805  BP: 112/83 113/79  Pulse: 91 (!) 102  Resp: 17 17  Temp: 98.1 F (36.7 C) 98 F (36.7 C)  SpO2: 93% 100%   Vitals:   01/23/19 1950 01/23/19 2025 01/24/19 0442 01/24/19 0805  BP: (!) 161/88  112/83 113/79  Pulse: 90  91 (!) 102  Resp: 16  17 17   Temp: 98.4 F (36.9 C)  98.1 F (36.7 C) 98 F  (36.7 C)  TempSrc: Oral  Oral   SpO2: 95% 96% 93% 100%  Weight:      Height:         GEN: NAD SKIN: No rash EYES: EOMI ENT: MMM CV: RRR PULM: Adequate air entry bilaterally, occasional mild wheezing, no rales heard ABD: soft, ND, NT, +BS CNS: AAO x 3, non focal EXT: No edema or tenderness   The results of significant diagnostics from this hospitalization (including imaging, microbiology, ancillary and laboratory) are listed below for reference.     Procedures and Diagnostic Studies:   Ct Angio Chest Pe W And/or Wo Contrast  Result Date: 01/21/2019 CLINICAL DATA:  Shortness of breath EXAM: CT ANGIOGRAPHY CHEST WITH CONTRAST TECHNIQUE: Multidetector CT imaging of the chest was performed using the standard protocol during bolus administration of intravenous contrast. Multiplanar CT image reconstructions and MIPs were obtained to evaluate the vascular anatomy. CONTRAST:  34mL OMNIPAQUE IOHEXOL 350 MG/ML SOLN COMPARISON:  03/09/2017 FINDINGS: Cardiovascular: Thoracic aorta is well visualized without evidence of aneurysmal dilatation or dissection. No cardiac enlargement is seen. Scattered coronary calcifications are noted. The pulmonary artery shows a normal branching pattern without focal intraluminal filling defect to suggest pulmonary embolism. Mediastinum/Nodes: Thoracic inlet is within normal limits. No hilar or mediastinal adenopathy is noted. The esophagus as visualized is within normal limits. Lungs/Pleura: Lungs are well aerated bilaterally. Minimal bibasilar atelectasis is seen in a dependent fashion. Small nodules are noted 1 on the left along the major fissure best  seen on image number 45 of series 6 stable from the prior exam and likely representing a subpleural lymph node. Additionally small subpleural nodules are noted within the right middle lobe on image number 53 and 49 of series 6. These are also stable from the prior exam. No focal confluent infiltrate or effusion is noted.  Upper Abdomen: Visualized upper abdomen shows no acute abnormality. Musculoskeletal: Healed right clavicular fracture is noted. Old right rib fractures are noted. No acute bony abnormality is seen. Review of the MIP images confirms the above findings. IMPRESSION: No evidence pulmonary emboli. Stable pulmonary nodules similar to that seen on the prior exam consistent with benign etiology. Mild basilar atelectasis bilaterally. Electronically Signed   By: Inez Catalina M.D.   On: 01/21/2019 23:42   Dg Chest Portable 1 View  Result Date: 01/21/2019 CLINICAL DATA:  Shortness of breath, wheezing. EXAM: PORTABLE CHEST 1 VIEW COMPARISON:  10/03/2018 FINDINGS: Lungs are clear. Cardiomediastinal contours and pulmonary vasculature unremarkable. Signs of remote right clavicular fracture. IMPRESSION: No active disease. Electronically Signed   By: Zetta Bills M.D.   On: 01/21/2019 21:14     Labs:   Basic Metabolic Panel: Recent Labs  Lab 01/21/19 2038 01/22/19 0542  NA 141 140  K 3.2* 4.0  CL 103 108  CO2 23 23  GLUCOSE 108* 181*  BUN 14 13  CREATININE 0.95 0.90  CALCIUM 9.3 8.8*   GFR Estimated Creatinine Clearance: 124.3 mL/min (by C-G formula based on SCr of 0.9 mg/dL). Liver Function Tests: No results for input(s): AST, ALT, ALKPHOS, BILITOT, PROT, ALBUMIN in the last 168 hours. No results for input(s): LIPASE, AMYLASE in the last 168 hours. No results for input(s): AMMONIA in the last 168 hours. Coagulation profile No results for input(s): INR, PROTIME in the last 168 hours.  CBC: Recent Labs  Lab 01/21/19 2038 01/22/19 0542  WBC 15.6* 8.7  HGB 13.5 12.2*  HCT 41.1 36.4*  MCV 85.6 84.1  PLT 288 249   Cardiac Enzymes: No results for input(s): CKTOTAL, CKMB, CKMBINDEX, TROPONINI in the last 168 hours. BNP: Invalid input(s): POCBNP CBG: No results for input(s): GLUCAP in the last 168 hours. D-Dimer No results for input(s): DDIMER in the last 72 hours. Hgb A1c No results for  input(s): HGBA1C in the last 72 hours. Lipid Profile No results for input(s): CHOL, HDL, LDLCALC, TRIG, CHOLHDL, LDLDIRECT in the last 72 hours. Thyroid function studies No results for input(s): TSH, T4TOTAL, T3FREE, THYROIDAB in the last 72 hours.  Invalid input(s): FREET3 Anemia work up No results for input(s): VITAMINB12, FOLATE, FERRITIN, TIBC, IRON, RETICCTPCT in the last 72 hours. Microbiology Recent Results (from the past 240 hour(s))  SARS CORONAVIRUS 2 (TAT 6-24 HRS) Nasopharyngeal Nasopharyngeal Swab     Status: None   Collection Time: 01/21/19  8:35 PM   Specimen: Nasopharyngeal Swab  Result Value Ref Range Status   SARS Coronavirus 2 NEGATIVE NEGATIVE Final    Comment: (NOTE) SARS-CoV-2 target nucleic acids are NOT DETECTED. The SARS-CoV-2 RNA is generally detectable in upper and lower respiratory specimens during the acute phase of infection. Negative results do not preclude SARS-CoV-2 infection, do not rule out co-infections with other pathogens, and should not be used as the sole basis for treatment or other patient management decisions. Negative results must be combined with clinical observations, patient history, and epidemiological information. The expected result is Negative. Fact Sheet for Patients: SugarRoll.be Fact Sheet for Healthcare Providers: https://www.woods-mathews.com/ This test is not yet approved or  cleared by the [place] and  has been authorized for detection and/or diagnosis of SARS-CoV-2 by FDA under an Emergency Use Authorization (EUA). This EUA will remain  in effect (meaning this test can be used) for the duration of the COVID-19 declaration under Section 56 4(b)(1) of the Act, 21 U.S.C. section 360bbb-3(b)(1), unless the authorization is terminated or revoked sooner. Performed at [facility], [place] [address], [place],  [address]      Discharge Instructions:   Discharge  Instructions    Diet - low sodium heart healthy   Complete by: As directed    For home use only DME Nebulizer machine   Complete by: As directed    Patient needs a nebulizer to treat with the following condition: COPD (chronic obstructive pulmonary disease) ([place])   Length of Need: Lifetime   Increase activity slowly   Complete by: As directed      Allergies as of 01/24/2019      Reactions   Amitriptyline Other (See Comments)   Paralysis of pt       Medication List    TAKE these medications   albuterol 108 (90 Base) MCG/ACT inhaler Commonly known as: VENTOLIN HFA Inhale 2 puffs into the lungs every 6 (six) hours as needed for wheezing or shortness of breath.   atomoxetine 25 MG capsule Commonly known as: STRATTERA Take 1 capsule (25 mg total) by mouth daily. What changed:   medication strength  how much to take   Breo Ellipta 100-25 MCG/INH Aepb Generic drug: fluticasone furoate-vilanterol Inhale 1 puff into the lungs daily.   cetirizine 10 MG tablet Commonly known as: ZYRTEC Take 10 mg by mouth daily.   clonazePAM 0.5 MG tablet Commonly known as: KLONOPIN Take 1 tablet (0.5 mg total) by mouth 2 (two) times daily as needed (Anxiety).   fluvoxaMINE 100 MG tablet Commonly known as: LUVOX Take 1 tablet (100 mg total) by mouth at bedtime.   ipratropium-albuterol 0.5-2.5 (3) MG/3ML Soln Commonly known as: DUONEB Take 3 mLs by nebulization every 6 (six) hours as needed (wheezing or shortness of breath).   lamoTRIgine 200 MG tablet Commonly known as: LAMICTAL Take 1 tablet (200 mg total) by mouth daily.   lisinopril 10 MG tablet Commonly known as: ZESTRIL Take 10 mg by mouth at bedtime.   omeprazole 40 MG capsule Commonly known as: PRILOSEC Take 1 capsule by mouth 2 (two) times daily.   predniSONE 20 MG tablet Commonly known as: DELTASONE Take 2 tablets (40 mg total) by mouth daily for 3 days.   QUEtiapine 200 MG tablet Commonly known as: SEROQUEL Take 1  tablet (200 mg total) by mouth at bedtime.   traZODone 150 MG tablet Commonly known as: DESYREL Take 1 tablet (150 mg total) by mouth at bedtime as needed for sleep.   vitamin B-12 1000 MCG tablet Commonly known as: CYANOCOBALAMIN Take by mouth.            Durable Medical Equipment  (From admission, onward)         Start     Ordered   01/23/19 0000  For home use only DME Nebulizer machine    Question Answer Comment  Patient needs a nebulizer to treat with the following condition COPD (chronic obstructive pulmonary disease) ([place])   Length of Need Lifetime      01/23/19 1529            Time coordinating discharge: 31 minutes  Signed:  Jennye Boroughs  Pager [phone] [facility] 01/24/2019, 10:57 AM

## 2019-01-24 NOTE — TOC Transition Note (Signed)
Transition of Care [facility]) - CM/SW Discharge Note   Patient Details  Name: YAVIER WESTBY MRN: [id number] Date of Birth: 03/01/1971  Transition of Care [facility]) CM/SW Contact:  Su Hilt, RN Phone Number: 01/24/2019, 11:58 AM   Clinical Narrative:    Patient to DC home with self care, provided a nebulizer machine. Mother will provide transport   Final next level of care: Home/Self Care     Patient Goals and CMS Choice        Discharge Placement                       Discharge Plan and Services                DME Arranged: Nebulizer/meds DME Agency: [employer] Date DME Agency Contacted: 01/23/19 Time DME Agency Contacted: [phone] Representative spoke with at DME Agency: [place]: NA          Social Determinants of Health ([place]) Interventions     Readmission Risk Interventions Readmission Risk Prevention Plan 01/24/2019  Transportation Screening Complete  PCP or Specialist Appt within 3-5 Days Complete  HRI or [place] Complete  Social Work Consult for [place] Planning/Counseling Patient refused  Palliative Care Screening Not Applicable  Medication Review Press photographer) Referral to Pharmacy  Some recent data might be hidden

## 2019-01-24 NOTE — TOC Progression Note (Signed)
Transition of Care [facility]) - Progression Note    Patient Details  Name: [name] MRN: [id number] Date of Birth: Oct 26, 1970  [facility]) CM/SW [place], RN Phone Number: 01/24/2019, 1:56 PM  Clinical Narrative:    Damaris Schooner to [facility] with [employer] asking about the nebulizer machine, he stated it would be delivered to the patient's house this evening. I told the patient and his mother in the room.  The patient is accepting of that   Expected Discharge Plan: Home/Self Care    Expected Discharge Plan and Services Expected Discharge Plan: Home/Self Care         Expected Discharge Date: 01/24/19               DME Arranged: Nebulizer/meds DME Agency: [employer] Date DME Agency Contacted: 01/23/19 Time DME Agency Contacted: [phone] Representative spoke with at DME Agency: [place]: NA           Social Determinants of Health ([place]) Interventions    Readmission Risk Interventions Readmission Risk Prevention Plan 01/24/2019  Transportation Screening Complete  PCP or Specialist Appt within 3-5 Days Complete  HRI or [place] Complete  Social Work Consult for [place] Planning/Counseling Patient refused  Palliative Care Screening Not Applicable  Medication Review Press photographer) Referral to Pharmacy  Some recent data might be hidden

## 2019-01-24 NOTE — TOC Progression Note (Signed)
Transition of Care [facility]) - Progression Note    Patient Details  Name: [name] MRN: [id number] Date of Birth: 03/08/1971  Transition of Care [facility]) CM/SW [place], RN Phone Number: 01/24/2019, 9:23 AM  Clinical Narrative:    The readmission risk assessment is complete  Expected Discharge Plan: Home/Self Care    Expected Discharge Plan and Services Expected Discharge Plan: Home/Self Care                         DME Arranged: Nebulizer/meds DME Agency: [employer] Date DME Agency Contacted: 01/23/19 Time DME Agency Contacted: [phone] Representative spoke with at DME Agency: [place]: NA           Social Determinants of Health ([place]) Interventions    Readmission Risk Interventions Readmission Risk Prevention Plan 01/24/2019  Transportation Screening Complete  PCP or Specialist Appt within 3-5 Days Complete  HRI or [place] Complete  Social Work Consult for [place] Planning/Counseling Patient refused  Palliative Care Screening Not Applicable  Medication Review Press photographer) Referral to Pharmacy  Some recent data might be hidden

## 2019-01-25 DIAGNOSIS — J449 Chronic obstructive pulmonary disease, unspecified: Secondary | ICD-10-CM | POA: Diagnosis not present

## 2019-01-28 ENCOUNTER — Telehealth: Payer: Self-pay

## 2019-01-28 MED ORDER — CLONAZEPAM 0.5 MG PO TABS: 0.5000 mg | ORAL_TABLET | Freq: Two times a day (BID) | ORAL | 0 refills | Status: DC | PRN

## 2019-01-28 NOTE — Telephone Encounter (Signed)
Okay 30 pills of Clonazepam sent to his pharmacy.

## 2019-01-28 NOTE — Telephone Encounter (Signed)
pt was in the hospital because of panic attack and he needs enough to get to your appt.

## 2019-01-28 NOTE — Telephone Encounter (Signed)
He was seen by dr. Gretel Acre on 12/10/18 and she sent 90 days prescriptions of all his meds to his [place]. Please tell him to contact his pharmacy for refills.  Thanks.

## 2019-01-28 NOTE — Telephone Encounter (Signed)
pt mother called states that the patient was in the hospital and they started him on the klonopin but they did not give him enough to get to his appt with dr. Toy Care. can dr. Toy Care please send in enough of the klonopin to get to his next appt.   clonazePAM (KLONOPIN) 0.5 MG tablet Medication Date: 01/24/2019 Department: [place] (1A) Ordering/Authorizing: Jennye Boroughs, MD  Order Providers  Prescribing Provider Encounter Provider  Jennye Boroughs, MD None  Outpatient Medication Detail   Disp Refills Start End   clonazePAM (KLONOPIN) 0.5 MG tablet 10 tablet 0 01/24/2019    Sig - Route: Take 1 tablet (0.5 mg total) by mouth 2 (two) times daily as needed (Anxiety). - Oral   Sent to pharmacy as: clonazePAM (KLONOPIN) 0.5 MG tablet   E-Prescribing Status: Receipt confirmed by pharmacy (01/24/2019 10:57 AM EST)   [place], [place]

## 2019-02-11 ENCOUNTER — Ambulatory Visit (INDEPENDENT_AMBULATORY_CARE_PROVIDER_SITE_OTHER): Payer: Medicare HMO | Admitting: Psychiatry

## 2019-02-11 ENCOUNTER — Emergency Department
Admission: EM | Admit: 2019-02-11 | Discharge: 2019-02-11 | Disposition: A | Discharge status: Home / Self Care | Payer: Medicare HMO | Attending: Emergency Medicine | Admitting: Emergency Medicine

## 2019-02-11 ENCOUNTER — Other Ambulatory Visit: Payer: Self-pay

## 2019-02-11 ENCOUNTER — Encounter: Payer: Self-pay | Admitting: Psychiatry

## 2019-02-11 DIAGNOSIS — J449 Chronic obstructive pulmonary disease, unspecified: Secondary | ICD-10-CM | POA: Diagnosis not present

## 2019-02-11 DIAGNOSIS — I10 Essential (primary) hypertension: Secondary | ICD-10-CM | POA: Diagnosis not present

## 2019-02-11 DIAGNOSIS — R112 Nausea with vomiting, unspecified: Secondary | ICD-10-CM | POA: Insufficient documentation

## 2019-02-11 DIAGNOSIS — F17290 Nicotine dependence, other tobacco product, uncomplicated: Secondary | ICD-10-CM | POA: Diagnosis not present

## 2019-02-11 DIAGNOSIS — K5900 Constipation, unspecified: Secondary | ICD-10-CM | POA: Insufficient documentation

## 2019-02-11 DIAGNOSIS — Z79899 Other long term (current) drug therapy: Secondary | ICD-10-CM | POA: Diagnosis not present

## 2019-02-11 DIAGNOSIS — F1210 Cannabis abuse, uncomplicated: Secondary | ICD-10-CM | POA: Diagnosis not present

## 2019-02-11 DIAGNOSIS — F3132 Bipolar disorder, current episode depressed, moderate: Secondary | ICD-10-CM

## 2019-02-11 DIAGNOSIS — F0390 Unspecified dementia without behavioral disturbance: Secondary | ICD-10-CM | POA: Insufficient documentation

## 2019-02-11 LAB — CBC WITH DIFFERENTIAL/PLATELET
Abs Immature Granulocytes: 0.02 10*3/uL (ref 0.00–0.07)
Basophils Absolute: 0.1 10*3/uL (ref 0.0–0.1)
Basophils Relative: 1 %
Eosinophils Absolute: 0.7 10*3/uL — ABNORMAL HIGH (ref 0.0–0.5)
Eosinophils Relative: 10 %
HCT: 38.3 % — ABNORMAL LOW (ref 39.0–52.0)
Hemoglobin: 12.3 g/dL — ABNORMAL LOW (ref 13.0–17.0)
Immature Granulocytes: 0 %
Lymphocytes Relative: 37 %
Lymphs Abs: 2.7 10*3/uL (ref 0.7–4.0)
MCH: 28.2 pg (ref 26.0–34.0)
MCHC: 32.1 g/dL (ref 30.0–36.0)
MCV: 87.8 fL (ref 80.0–100.0)
Monocytes Absolute: 0.5 10*3/uL (ref 0.1–1.0)
Monocytes Relative: 7 %
Neutro Abs: 3.3 10*3/uL (ref 1.7–7.7)
Neutrophils Relative %: 45 %
Platelets: 291 10*3/uL (ref 150–400)
RBC: 4.36 MIL/uL (ref 4.22–5.81)
RDW: 13.5 % (ref 11.5–15.5)
WBC: 7.3 10*3/uL (ref 4.0–10.5)
nRBC: 0 % (ref 0.0–0.2)

## 2019-02-11 LAB — COMPREHENSIVE METABOLIC PANEL
ALT: 28 U/L (ref 0–44)
AST: 18 U/L (ref 15–41)
Albumin: 4 g/dL (ref 3.5–5.0)
Alkaline Phosphatase: 92 U/L (ref 38–126)
Anion gap: 12 (ref 5–15)
BUN: 10 mg/dL (ref 6–20)
CO2: 27 mmol/L (ref 22–32)
Calcium: 9.5 mg/dL (ref 8.9–10.3)
Chloride: 99 mmol/L (ref 98–111)
Creatinine, Ser: 1.03 mg/dL (ref 0.61–1.24)
GFR calc Af Amer: >60 mL/min (ref >60)
GFR calc non Af Amer: >60 mL/min (ref >60)
Glucose, Bld: 94 mg/dL (ref 70–99)
Potassium: 4.4 mmol/L (ref 3.5–5.1)
Sodium: 138 mmol/L (ref 135–145)
Total Bilirubin: 0.4 mg/dL (ref 0.3–1.2)
Total Protein: 7.4 g/dL (ref 6.5–8.1)

## 2019-02-11 LAB — LIPASE, BLOOD: Lipase: 30 U/L (ref 11–51)

## 2019-02-11 MED ORDER — LAMOTRIGINE 200 MG PO TABS: 200.0000 mg | ORAL_TABLET | Freq: Every day | ORAL | 0 refills | Status: DC

## 2019-02-11 MED ORDER — FLUVOXAMINE MALEATE 100 MG PO TABS: 100.0000 mg | ORAL_TABLET | Freq: Every day | ORAL | 0 refills | Status: DC

## 2019-02-11 MED ORDER — CLONAZEPAM 0.5 MG PO TABS: 0.5000 mg | ORAL_TABLET | Freq: Two times a day (BID) | ORAL | 1 refills | Status: DC | PRN

## 2019-02-11 MED ORDER — TRAZODONE HCL 150 MG PO TABS: 150.0000 mg | ORAL_TABLET | Freq: Every evening | ORAL | 0 refills | Status: DC | PRN

## 2019-02-11 MED ORDER — DOCUSATE SODIUM 100 MG PO CAPS: 100.0000 mg | ORAL_CAPSULE | Freq: Two times a day (BID) | ORAL | 0 refills | Status: AC

## 2019-02-11 MED ORDER — QUETIAPINE FUMARATE 200 MG PO TABS: 200.0000 mg | ORAL_TABLET | Freq: Every day | ORAL | 0 refills | Status: DC

## 2019-02-11 NOTE — Discharge Instructions (Signed)
Please start taking a fiber supplement such as Metamucil daily along with the stool softener Colace that you were prescribed.  Please also start taking MiraLAX consistently, you may start with 1 capful twice a day and increase this up to 4 capfuls twice a day until you have results.  Please schedule follow-up with your PCP and return to the ER for abdominal pain or vomiting.

## 2019-02-11 NOTE — ED Notes (Signed)
Patient has called out several times for "something for my nerves." Dr. Charna Archer aware.

## 2019-02-11 NOTE — ED Notes (Addendum)
Patient is requesting a Klonopin 1.5mg  PO for "my nerves." Dr. Charna Archer aware.

## 2019-02-11 NOTE — ED Triage Notes (Signed)
Pt states he has been constipated since colonoscopy and EGD about a month ago, had a GI bleed and vomited blood as well, was able to have a small bm this am. Vomited feces about 4-5 days ago, since then has been increasing his diet and no longer vomiting, but feels like he should be having bm's but isn't. NAD.
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
## 2019-02-11 NOTE — ED Notes (Signed)
Patient is lying on stretcher watching a football game. Significant other at bedside. NAD.

## 2019-02-24 DIAGNOSIS — J449 Chronic obstructive pulmonary disease, unspecified: Secondary | ICD-10-CM | POA: Diagnosis not present

## 2019-03-27 DIAGNOSIS — J449 Chronic obstructive pulmonary disease, unspecified: Secondary | ICD-10-CM | POA: Diagnosis not present

## 2019-04-10 ENCOUNTER — Ambulatory Visit: Payer: Medicare HMO | Admitting: Psychiatry

## 2019-04-10 ENCOUNTER — Other Ambulatory Visit: Payer: Self-pay

## 2019-04-11 ENCOUNTER — Ambulatory Visit (INDEPENDENT_AMBULATORY_CARE_PROVIDER_SITE_OTHER): Payer: Medicare HMO | Admitting: Psychiatry

## 2019-04-11 ENCOUNTER — Encounter: Payer: Self-pay | Admitting: Psychiatry

## 2019-04-11 DIAGNOSIS — F3132 Bipolar disorder, current episode depressed, moderate: Secondary | ICD-10-CM

## 2019-04-11 MED ORDER — QUETIAPINE FUMARATE 200 MG PO TABS: 200.0000 mg | ORAL_TABLET | Freq: Every day | ORAL | 0 refills | Status: DC

## 2019-04-11 MED ORDER — FLUVOXAMINE MALEATE 100 MG PO TABS: 100.0000 mg | ORAL_TABLET | Freq: Every day | ORAL | 0 refills | Status: DC

## 2019-04-11 MED ORDER — LAMOTRIGINE 200 MG PO TABS: 200.0000 mg | ORAL_TABLET | Freq: Every day | ORAL | 0 refills | Status: DC

## 2019-04-11 MED ORDER — CLONAZEPAM 0.5 MG PO TABS: 0.5000 mg | ORAL_TABLET | Freq: Two times a day (BID) | ORAL | 1 refills | Status: DC | PRN

## 2019-04-11 MED ORDER — TRAZODONE HCL 150 MG PO TABS: 150.0000 mg | ORAL_TABLET | Freq: Every evening | ORAL | 0 refills | Status: DC | PRN
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
## 2019-04-23 ENCOUNTER — Ambulatory Visit: Payer: Medicare HMO | Admitting: Gastroenterology

## 2019-04-27 DIAGNOSIS — J449 Chronic obstructive pulmonary disease, unspecified: Secondary | ICD-10-CM | POA: Diagnosis not present

## 2019-05-01 DIAGNOSIS — R569 Unspecified convulsions: Secondary | ICD-10-CM | POA: Diagnosis not present

## 2019-05-01 DIAGNOSIS — R4189 Other symptoms and signs involving cognitive functions and awareness: Secondary | ICD-10-CM | POA: Diagnosis not present

## 2019-05-01 DIAGNOSIS — F411 Generalized anxiety disorder: Secondary | ICD-10-CM | POA: Diagnosis not present

## 2019-05-25 DIAGNOSIS — J449 Chronic obstructive pulmonary disease, unspecified: Secondary | ICD-10-CM | POA: Diagnosis not present

## 2019-06-06 ENCOUNTER — Ambulatory Visit (INDEPENDENT_AMBULATORY_CARE_PROVIDER_SITE_OTHER): Payer: Medicare HMO | Admitting: Psychiatry

## 2019-06-06 ENCOUNTER — Other Ambulatory Visit: Payer: Self-pay

## 2019-06-06 ENCOUNTER — Encounter: Payer: Self-pay | Admitting: Psychiatry

## 2019-06-06 DIAGNOSIS — F3132 Bipolar disorder, current episode depressed, moderate: Secondary | ICD-10-CM

## 2019-06-06 MED ORDER — CLONAZEPAM 0.5 MG PO TABS: 0.5000 mg | ORAL_TABLET | Freq: Two times a day (BID) | ORAL | 2 refills | Status: DC | PRN

## 2019-06-06 MED ORDER — TRAZODONE HCL 150 MG PO TABS: 150.0000 mg | ORAL_TABLET | Freq: Every evening | ORAL | 0 refills | Status: DC | PRN

## 2019-06-06 MED ORDER — FLUVOXAMINE MALEATE 100 MG PO TABS: 100.0000 mg | ORAL_TABLET | Freq: Every day | ORAL | 0 refills | Status: DC

## 2019-06-06 MED ORDER — QUETIAPINE FUMARATE 200 MG PO TABS: 200.0000 mg | ORAL_TABLET | Freq: Every day | ORAL | 0 refills | Status: DC
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
## 2019-06-11 ENCOUNTER — Ambulatory Visit: Payer: Medicare HMO | Admitting: Adult Health

## 2019-06-25 DIAGNOSIS — J449 Chronic obstructive pulmonary disease, unspecified: Secondary | ICD-10-CM | POA: Diagnosis not present

## 2019-06-26 DIAGNOSIS — E538 Deficiency of other specified B group vitamins: Secondary | ICD-10-CM | POA: Diagnosis not present

## 2019-06-26 DIAGNOSIS — E559 Vitamin D deficiency, unspecified: Secondary | ICD-10-CM | POA: Diagnosis not present

## 2019-06-26 DIAGNOSIS — Z79899 Other long term (current) drug therapy: Secondary | ICD-10-CM | POA: Diagnosis not present

## 2019-06-28 ENCOUNTER — Ambulatory Visit: Payer: Medicare HMO | Admitting: Adult Health

## 2019-07-01 ENCOUNTER — Other Ambulatory Visit: Payer: Self-pay

## 2019-07-01 ENCOUNTER — Ambulatory Visit: Payer: Medicare HMO | Admitting: Gastroenterology

## 2019-07-01 ENCOUNTER — Encounter: Payer: Self-pay | Admitting: Gastroenterology

## 2019-07-01 VITALS — BP 111/80 | HR 94 | Temp 98.0°F | Ht 73.0 in | Wt 173.1 lb

## 2019-07-01 DIAGNOSIS — R634 Abnormal weight loss: Secondary | ICD-10-CM

## 2019-07-01 DIAGNOSIS — Z862 Personal history of diseases of the blood and blood-forming organs and certain disorders involving the immune mechanism: Secondary | ICD-10-CM

## 2019-07-01 DIAGNOSIS — K5909 Other constipation: Secondary | ICD-10-CM

## 2019-07-01 NOTE — Patient Instructions (Signed)
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
## 2019-07-02 LAB — CBC
Hematocrit: 38.4 % (ref 37.5–51.0)
Hemoglobin: 12.4 g/dL — ABNORMAL LOW (ref 13.0–17.7)
MCH: 27 pg (ref 26.6–33.0)
MCHC: 32.3 g/dL (ref 31.5–35.7)
MCV: 84 fL (ref 79–97)
Platelets: 274 10*3/uL (ref 150–450)
RBC: 4.59 x10E6/uL (ref 4.14–5.80)
RDW: 13.1 % (ref 11.6–15.4)
WBC: 7.2 10*3/uL (ref 3.4–10.8)

## 2019-07-02 LAB — IRON,TIBC AND FERRITIN PANEL
Ferritin: 79 ng/mL (ref 30–400)
Iron Saturation: 13 % — ABNORMAL LOW (ref 15–55)
Iron: 41 ug/dL (ref 38–169)
Total Iron Binding Capacity: 325 ug/dL (ref 250–450)
UIBC: 284 ug/dL (ref 111–343)

## 2019-07-03 DIAGNOSIS — E782 Mixed hyperlipidemia: Secondary | ICD-10-CM | POA: Diagnosis not present

## 2019-07-03 DIAGNOSIS — I10 Essential (primary) hypertension: Secondary | ICD-10-CM | POA: Diagnosis not present

## 2019-07-03 DIAGNOSIS — G40909 Epilepsy, unspecified, not intractable, without status epilepticus: Secondary | ICD-10-CM | POA: Diagnosis not present

## 2019-07-03 DIAGNOSIS — J449 Chronic obstructive pulmonary disease, unspecified: Secondary | ICD-10-CM | POA: Diagnosis not present

## 2019-07-03 DIAGNOSIS — R7303 Prediabetes: Secondary | ICD-10-CM | POA: Diagnosis not present

## 2019-07-03 DIAGNOSIS — R4189 Other symptoms and signs involving cognitive functions and awareness: Secondary | ICD-10-CM | POA: Diagnosis not present

## 2019-07-03 DIAGNOSIS — S069X0S Unspecified intracranial injury without loss of consciousness, sequela: Secondary | ICD-10-CM | POA: Diagnosis not present

## 2019-07-09 DIAGNOSIS — E782 Mixed hyperlipidemia: Secondary | ICD-10-CM | POA: Diagnosis not present

## 2019-07-09 DIAGNOSIS — I10 Essential (primary) hypertension: Secondary | ICD-10-CM | POA: Diagnosis not present

## 2019-07-09 DIAGNOSIS — R7303 Prediabetes: Secondary | ICD-10-CM | POA: Diagnosis not present

## 2019-07-09 DIAGNOSIS — Z1159 Encounter for screening for other viral diseases: Secondary | ICD-10-CM | POA: Diagnosis not present

## 2019-07-25 DIAGNOSIS — J449 Chronic obstructive pulmonary disease, unspecified: Secondary | ICD-10-CM | POA: Diagnosis not present

## 2019-08-25 DIAGNOSIS — J449 Chronic obstructive pulmonary disease, unspecified: Secondary | ICD-10-CM | POA: Diagnosis not present

## 2019-08-30 ENCOUNTER — Telehealth (INDEPENDENT_AMBULATORY_CARE_PROVIDER_SITE_OTHER): Payer: Medicare HMO | Admitting: Psychiatry

## 2019-08-30 ENCOUNTER — Telehealth: Payer: Medicare HMO | Admitting: Psychiatry

## 2019-08-30 ENCOUNTER — Other Ambulatory Visit: Payer: Self-pay

## 2019-08-30 ENCOUNTER — Encounter (HOSPITAL_COMMUNITY): Payer: Self-pay | Admitting: Psychiatry

## 2019-08-30 DIAGNOSIS — F3132 Bipolar disorder, current episode depressed, moderate: Secondary | ICD-10-CM

## 2019-08-30 MED ORDER — CLONAZEPAM 0.5 MG PO TABS: 0.5000 mg | ORAL_TABLET | Freq: Two times a day (BID) | ORAL | 2 refills | Status: DC | PRN

## 2019-08-30 MED ORDER — QUETIAPINE FUMARATE 200 MG PO TABS: 200.0000 mg | ORAL_TABLET | Freq: Every day | ORAL | 0 refills | Status: DC

## 2019-08-30 MED ORDER — FLUVOXAMINE MALEATE 100 MG PO TABS: 100.0000 mg | ORAL_TABLET | Freq: Every day | ORAL | 0 refills | Status: DC

## 2019-08-30 MED ORDER — TRAZODONE HCL 150 MG PO TABS: 150.0000 mg | ORAL_TABLET | Freq: Every evening | ORAL | 0 refills | Status: DC | PRN
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
## 2019-09-02 ENCOUNTER — Ambulatory Visit: Payer: Medicare HMO | Admitting: Gastroenterology

## 2019-09-24 DIAGNOSIS — J449 Chronic obstructive pulmonary disease, unspecified: Secondary | ICD-10-CM | POA: Diagnosis not present

## 2019-10-25 DIAGNOSIS — J449 Chronic obstructive pulmonary disease, unspecified: Secondary | ICD-10-CM | POA: Diagnosis not present

## 2019-10-30 DIAGNOSIS — F17218 Nicotine dependence, cigarettes, with other nicotine-induced disorders: Secondary | ICD-10-CM | POA: Diagnosis not present

## 2019-10-30 DIAGNOSIS — J449 Chronic obstructive pulmonary disease, unspecified: Secondary | ICD-10-CM | POA: Diagnosis not present

## 2019-10-30 DIAGNOSIS — R918 Other nonspecific abnormal finding of lung field: Secondary | ICD-10-CM | POA: Diagnosis not present

## 2019-10-30 DIAGNOSIS — R0600 Dyspnea, unspecified: Secondary | ICD-10-CM | POA: Diagnosis not present

## 2019-11-25 DIAGNOSIS — J449 Chronic obstructive pulmonary disease, unspecified: Secondary | ICD-10-CM | POA: Diagnosis not present

## 2019-11-26 ENCOUNTER — Other Ambulatory Visit: Payer: Self-pay

## 2019-11-27 ENCOUNTER — Ambulatory Visit: Payer: Medicare HMO | Admitting: Gastroenterology

## 2019-11-29 ENCOUNTER — Other Ambulatory Visit: Payer: Self-pay

## 2019-11-29 ENCOUNTER — Telehealth (INDEPENDENT_AMBULATORY_CARE_PROVIDER_SITE_OTHER): Payer: Medicare HMO | Admitting: Psychiatry

## 2019-11-29 ENCOUNTER — Encounter (HOSPITAL_COMMUNITY): Payer: Self-pay | Admitting: Psychiatry

## 2019-11-29 DIAGNOSIS — F3132 Bipolar disorder, current episode depressed, moderate: Secondary | ICD-10-CM

## 2019-11-29 DIAGNOSIS — F3170 Bipolar disorder, currently in remission, most recent episode unspecified: Secondary | ICD-10-CM

## 2019-11-29 MED ORDER — QUETIAPINE FUMARATE 200 MG PO TABS: 200.0000 mg | ORAL_TABLET | Freq: Every day | ORAL | 0 refills | Status: DC

## 2019-11-29 MED ORDER — TRAZODONE HCL 150 MG PO TABS: 150.0000 mg | ORAL_TABLET | Freq: Every evening | ORAL | 0 refills | Status: DC | PRN

## 2019-11-29 MED ORDER — FLUVOXAMINE MALEATE 100 MG PO TABS: 100.0000 mg | ORAL_TABLET | Freq: Every day | ORAL | 0 refills | Status: DC

## 2019-11-29 MED ORDER — CLONAZEPAM 0.5 MG PO TABS: 0.5000 mg | ORAL_TABLET | Freq: Two times a day (BID) | ORAL | 2 refills | Status: DC | PRN
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
## 2019-12-25 DIAGNOSIS — J449 Chronic obstructive pulmonary disease, unspecified: Secondary | ICD-10-CM | POA: Diagnosis not present

## 2020-01-02 DIAGNOSIS — Z0000 Encounter for general adult medical examination without abnormal findings: Secondary | ICD-10-CM | POA: Diagnosis not present

## 2020-01-02 DIAGNOSIS — Z862 Personal history of diseases of the blood and blood-forming organs and certain disorders involving the immune mechanism: Secondary | ICD-10-CM | POA: Diagnosis not present

## 2020-01-02 DIAGNOSIS — E782 Mixed hyperlipidemia: Secondary | ICD-10-CM | POA: Diagnosis not present

## 2020-01-02 DIAGNOSIS — Z86010 Personal history of colonic polyps: Secondary | ICD-10-CM | POA: Insufficient documentation

## 2020-01-02 DIAGNOSIS — I10 Essential (primary) hypertension: Secondary | ICD-10-CM | POA: Diagnosis not present

## 2020-01-02 DIAGNOSIS — R7303 Prediabetes: Secondary | ICD-10-CM | POA: Diagnosis not present

## 2020-01-25 DIAGNOSIS — J449 Chronic obstructive pulmonary disease, unspecified: Secondary | ICD-10-CM | POA: Diagnosis not present

## 2020-02-02 ENCOUNTER — Encounter: Payer: Self-pay | Admitting: Emergency Medicine

## 2020-02-02 ENCOUNTER — Other Ambulatory Visit: Payer: Self-pay

## 2020-02-02 ENCOUNTER — Emergency Department
Admission: EM | Admit: 2020-02-02 | Discharge: 2020-02-05 | Disposition: A | Discharge status: Home / Self Care | Payer: Medicare HMO | Attending: Emergency Medicine | Admitting: Emergency Medicine

## 2020-02-02 DIAGNOSIS — Z79899 Other long term (current) drug therapy: Secondary | ICD-10-CM | POA: Diagnosis not present

## 2020-02-02 DIAGNOSIS — F17290 Nicotine dependence, other tobacco product, uncomplicated: Secondary | ICD-10-CM | POA: Diagnosis not present

## 2020-02-02 DIAGNOSIS — J449 Chronic obstructive pulmonary disease, unspecified: Secondary | ICD-10-CM | POA: Insufficient documentation

## 2020-02-02 DIAGNOSIS — F0390 Unspecified dementia without behavioral disturbance: Secondary | ICD-10-CM | POA: Diagnosis not present

## 2020-02-02 DIAGNOSIS — F3132 Bipolar disorder, current episode depressed, moderate: Secondary | ICD-10-CM | POA: Diagnosis not present

## 2020-02-02 DIAGNOSIS — F3170 Bipolar disorder, currently in remission, most recent episode unspecified: Secondary | ICD-10-CM

## 2020-02-02 DIAGNOSIS — Z7951 Long term (current) use of inhaled steroids: Secondary | ICD-10-CM | POA: Insufficient documentation

## 2020-02-02 DIAGNOSIS — F29 Unspecified psychosis not due to a substance or known physiological condition: Secondary | ICD-10-CM | POA: Diagnosis not present

## 2020-02-02 DIAGNOSIS — I10 Essential (primary) hypertension: Secondary | ICD-10-CM | POA: Diagnosis not present

## 2020-02-02 LAB — COMPREHENSIVE METABOLIC PANEL
ALT: 18 U/L (ref 0–44)
AST: 19 U/L (ref 15–41)
Albumin: 4 g/dL (ref 3.5–5.0)
Alkaline Phosphatase: 87 U/L (ref 38–126)
Anion gap: 11 (ref 5–15)
BUN: 13 mg/dL (ref 6–20)
CO2: 29 mmol/L (ref 22–32)
Calcium: 9.4 mg/dL (ref 8.9–10.3)
Chloride: 102 mmol/L (ref 98–111)
Creatinine, Ser: 0.95 mg/dL (ref 0.61–1.24)
GFR, Estimated: >60 mL/min (ref >60)
Glucose, Bld: 99 mg/dL (ref 70–99)
Potassium: 4.4 mmol/L (ref 3.5–5.1)
Sodium: 142 mmol/L (ref 135–145)
Total Bilirubin: 0.5 mg/dL (ref 0.3–1.2)
Total Protein: 7 g/dL (ref 6.5–8.1)

## 2020-02-02 LAB — URINE DRUG SCREEN, QUALITATIVE (ARMC ONLY)
Amphetamines, Ur Screen: POSITIVE — AB
Barbiturates, Ur Screen: NOT DETECTED
Benzodiazepine, Ur Scrn: NOT DETECTED
Cannabinoid 50 Ng, Ur ~~LOC~~: NOT DETECTED
Cocaine Metabolite,Ur ~~LOC~~: NOT DETECTED
MDMA (Ecstasy)Ur Screen: NOT DETECTED
Methadone Scn, Ur: NOT DETECTED
Opiate, Ur Screen: NOT DETECTED
Phencyclidine (PCP) Ur S: NOT DETECTED
Tricyclic, Ur Screen: NOT DETECTED

## 2020-02-02 LAB — CBC
HCT: 38.7 % — ABNORMAL LOW (ref 39.0–52.0)
Hemoglobin: 12.6 g/dL — ABNORMAL LOW (ref 13.0–17.0)
MCH: 28.2 pg (ref 26.0–34.0)
MCHC: 32.6 g/dL (ref 30.0–36.0)
MCV: 86.6 fL (ref 80.0–100.0)
Platelets: 312 10*3/uL (ref 150–400)
RBC: 4.47 MIL/uL (ref 4.22–5.81)
RDW: 13.6 % (ref 11.5–15.5)
WBC: 6.1 10*3/uL (ref 4.0–10.5)
nRBC: 0 % (ref 0.0–0.2)

## 2020-02-02 LAB — SALICYLATE LEVEL: Salicylate Lvl: <7 mg/dL — ABNORMAL LOW (ref 7.0–30.0)

## 2020-02-02 LAB — ACETAMINOPHEN LEVEL: Acetaminophen (Tylenol), Serum: <10 ug/mL — ABNORMAL LOW (ref 10–30)

## 2020-02-02 LAB — ETHANOL: Alcohol, Ethyl (B): <10 mg/dL (ref <10)

## 2020-02-02 MED ORDER — CLONAZEPAM 0.5 MG PO TABS: 0.5000 mg | ORAL_TABLET | Freq: Two times a day (BID) | ORAL | Status: DC | PRN

## 2020-02-02 MED ORDER — QUETIAPINE FUMARATE 200 MG PO TABS
200.0000 mg | ORAL_TABLET | Freq: Every day | ORAL | Status: DC
  Administered 2020-02-03 – 2020-02-04 (×2): 200 mg via ORAL
  Filled 2020-02-02 (×2): qty 1

## 2020-02-02 MED ORDER — DIPHENHYDRAMINE HCL 25 MG PO CAPS: 50.0000 mg | ORAL_CAPSULE | Freq: Four times a day (QID) | ORAL | Status: DC | PRN

## 2020-02-02 MED ORDER — HALOPERIDOL 5 MG PO TABS
5.0000 mg | ORAL_TABLET | Freq: Four times a day (QID) | ORAL | Status: DC | PRN
  Administered 2020-02-03: 5 mg via ORAL
  Filled 2020-02-02: qty 1

## 2020-02-02 MED ORDER — LAMOTRIGINE ER 200 MG PO TB24: 200.0000 mg | ORAL_TABLET | Freq: Every day | ORAL | Status: DC

## 2020-02-02 NOTE — ED Notes (Signed)
IVC called [place] for consult spoke to [facility]

## 2020-02-02 NOTE — ED Triage Notes (Signed)
Pt to ED via [facility] Department. Per IVC paperwork, "patient is out of his medication and walking around on the property with a gun allegedly seeing people/things that are not not there".  Pt states that he has been taking his medication but that the people around there are hard on him. Pt is talking about someone breaking into his house and stealing his WiFi password. Pt states that he takes his medication as much as he can but he is trying to stay awake because he worries about these people breaking into his house.   Pt is calm and cooperative in triage.

## 2020-02-02 NOTE — ED Notes (Signed)
Dinner tray given

## 2020-02-02 NOTE — ED Notes (Signed)
Reports to Dr Rockie Neighbours, Karen Chafe [facility] psych

## 2020-02-02 NOTE — ED Notes (Signed)
SOC placed in pts room

## 2020-02-02 NOTE — ED Notes (Signed)
Pt sleeping in bed, lights dimmed.  Will continue 15 min checks
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
## 2020-02-02 NOTE — ED Notes (Signed)
Pt up to toilet at this time. Pt denies any complaints.

## 2020-02-03 DIAGNOSIS — F3132 Bipolar disorder, current episode depressed, moderate: Secondary | ICD-10-CM | POA: Diagnosis not present

## 2020-02-03 MED ORDER — CLONAZEPAM 0.5 MG PO TABS: 0.5000 mg | ORAL_TABLET | Freq: Every day | ORAL | Status: DC

## 2020-02-03 MED ORDER — CLONAZEPAM 0.5 MG PO TABS: 0.5000 mg | ORAL_TABLET | Freq: Every day | ORAL | Status: DC | PRN

## 2020-02-03 MED ORDER — GABAPENTIN 300 MG PO CAPS
300.0000 mg | ORAL_CAPSULE | Freq: Three times a day (TID) | ORAL | Status: DC
  Administered 2020-02-03 – 2020-02-05 (×6): 300 mg via ORAL
  Filled 2020-02-03 (×6): qty 1

## 2020-02-03 MED ORDER — FLUTICASONE FUROATE-VILANTEROL 100-25 MCG/INH IN AEPB
1.0000 | INHALATION_SPRAY | Freq: Every day | RESPIRATORY_TRACT | Status: DC
  Administered 2020-02-05: 11:00:00 1 via RESPIRATORY_TRACT
  Filled 2020-02-03: qty 28

## 2020-02-03 MED ORDER — VITAMIN B-12 1000 MCG PO TABS
1000.0000 ug | ORAL_TABLET | Freq: Every day | ORAL | Status: DC
  Administered 2020-02-04 – 2020-02-05 (×2): 1000 ug via ORAL
  Filled 2020-02-03 (×4): qty 1

## 2020-02-03 MED ORDER — LAMOTRIGINE 100 MG PO TABS: 100.0000 mg | ORAL_TABLET | Freq: Two times a day (BID) | ORAL | Status: DC

## 2020-02-03 MED ORDER — TRAZODONE HCL 100 MG PO TABS: 100.0000 mg | ORAL_TABLET | Freq: Every evening | ORAL | Status: DC | PRN

## 2020-02-03 MED ORDER — FLUVOXAMINE MALEATE 50 MG PO TABS
100.0000 mg | ORAL_TABLET | Freq: Every day | ORAL | Status: DC
  Administered 2020-02-03: 100 mg via ORAL
  Filled 2020-02-03 (×2): qty 2

## 2020-02-03 MED ORDER — LAMOTRIGINE 100 MG PO TABS
100.0000 mg | ORAL_TABLET | Freq: Two times a day (BID) | ORAL | Status: DC
  Administered 2020-02-04 – 2020-02-05 (×3): 100 mg via ORAL
  Filled 2020-02-03 (×3): qty 1

## 2020-02-03 MED ORDER — TRAZODONE HCL 50 MG PO TABS: 150.0000 mg | ORAL_TABLET | Freq: Every evening | ORAL | Status: DC | PRN

## 2020-02-03 NOTE — ED Notes (Signed)
Hourly rounding completed at this time, patient currently asleep in room. No complaints, stable, and in no acute distress. Q15 minute rounds and monitoring via Rover and Officer to continue. 
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
## 2020-02-03 NOTE — ED Notes (Signed)
Unable to get vitals due to pt sleeping

## 2020-02-03 NOTE — ED Provider Notes (Signed)
Emergency Medicine Observation Re-evaluation Note  [name] is a 49 y.o. male, seen on rounds today.  Pt initially presented to the ED for complaints of IVC Currently, the patient is resting, voices no medical complaints.  Physical Exam  BP 95/60   Pulse 86   Temp 98.4 F (36.9 C) (Oral)   Resp 15   Ht 6\' 1"  (1.854 m)   Wt 77.1 kg   SpO2 98%   BMI 22.43 kg/m  Physical Exam General: Resting in no acute distress Cardiac: No cyanosis Lungs: Equal rise and fall Psych: Not agitated  ED Course / MDM  EKG:    I have reviewed the labs performed to date as well as medications administered while in observation.  Recent changes in the last 24 hours include no events overnight.  Plan  Current plan is for inpatient admission as recommended by [facility] psychiatry. Patient is under full IVC at this time.   Paulette Blanch, MD 02/03/20 [phone]

## 2020-02-03 NOTE — ED Notes (Signed)
Pt. To BHU from ED ambulatory without difficulty, to room  BHU 4. Report from [facility]. Pt. Is alert and oriented, warm and dry in no distress. Pt. Denies SI, HI, and AVH. Pt. Calm and cooperative. Pt. Made aware of security cameras and Q15 minute rounds. Pt. Encouraged to let Nursing staff know of any concerns or needs.

## 2020-02-03 NOTE — ED Notes (Signed)
He continues to await inpatient treatment

## 2020-02-03 NOTE — ED Notes (Signed)
Received report from [employer]. Patient to be transferred to [facility] room 4.

## 2020-02-03 NOTE — ED Notes (Signed)
Patient was assessed by [facility], Doreene Burke, MD. [facility] reports that patient is appropriate for inpatient admission. Writer faxed patients report out to the following facilities.   [facility]

## 2020-02-03 NOTE — ED Notes (Signed)
BEHAVIORAL HEALTH ROUNDING Patient sleeping: Yes.   Patient alert and oriented: not applicable Behavior appropriate: Yes.  ; If no, describe:   Nutrition and fluids offered: Yes  Toileting and hygiene offered: Yes  Sitter present: not applicable Law enforcement present: Yes   Pt resting quietly with even and unlabored respirations, no acute distress noted, lights and tv off, will continue to monitor  ENVIRONMENTAL ASSESSMENT Potentially harmful objects out of patient reach: Yes.   Personal belongings secured: Yes.   Patient dressed in hospital provided attire only: Yes.   Plastic bags out of patient reach: Yes.   Patient care equipment (cords, cables, call bells, lines, and drains) shortened, removed, or accounted for: Yes.   Equipment and supplies removed from bottom of stretcher: Yes.   Potentially toxic materials out of patient reach: Yes.   Sharps container removed or out of patient reach: Yes.

## 2020-02-03 NOTE — ED Notes (Addendum)
Pt with a visit from someone that reports they are his mother  - visit observed by pt relations  - [name]

## 2020-02-03 NOTE — ED Notes (Signed)
Report received from Amy, RN including situation, background, assessment and recommendations. Patient sleeping, respirations regular and unlabored. Q15 minute rounds and security camera observation to continue. Will assess patient once awake.
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
## 2020-02-04 MED ORDER — FLUVOXAMINE MALEATE 50 MG PO TABS
100.0000 mg | ORAL_TABLET | Freq: Two times a day (BID) | ORAL | Status: DC
  Administered 2020-02-04 – 2020-02-05 (×2): 100 mg via ORAL
  Filled 2020-02-04 (×3): qty 2

## 2020-02-04 NOTE — ED Notes (Signed)
Patient sleeping. Unable to complete psych assessment at this time.  ENVIRONMENTAL ASSESSMENT Potentially harmful objects out of patient reach: Yes.   Personal belongings secured: Yes.   Patient dressed in hospital provided attire only: Yes.   Plastic bags out of patient reach: Yes.   Patient care equipment (cords, cables, call bells, lines, and drains) shortened, removed, or accounted for: Yes.   Equipment and supplies removed from bottom of stretcher: Yes.   Potentially toxic materials out of patient reach: Yes.   Sharps container removed or out of patient reach: Yes.

## 2020-02-04 NOTE — ED Notes (Signed)
Pt wakes easily but states "I will shower when I wake up". Told pt to let this RN know.

## 2020-02-04 NOTE — ED Notes (Signed)
INVOLUNTARY continues to await placement

## 2020-02-04 NOTE — ED Notes (Signed)
Pt has mother as visitor at bedside. Monitored by Otho Perl, patient relations. Mother updated on medication changes.

## 2020-02-04 NOTE — BH Assessment (Addendum)
Belenda Cruise of [place] [phone]) called to inquire about whether or not pt needs inpatient treatment. This Probation officer advised that the pt was recommended for overnight observation and reassessment in the AM by our NP Gust Rung. This writer advised that they call back in the AM to revisit pt's needs after pt has been reassessed.

## 2020-02-04 NOTE — ED Notes (Signed)
Pt remains slightly paranoid. States "I need to get out of here" then looks around the room. Verbalizes that this RN can update pt's mother. Calm and cooperative.

## 2020-02-04 NOTE — ED Provider Notes (Signed)
Emergency Medicine Observation Re-evaluation Note  [name] is a 49 y.o. male, seen on rounds today.  Pt initially presented to the ED for complaints of IVC Currently, the patient is resting.  Physical Exam  BP 116/69 (BP Location: Left Arm)   Pulse 79   Temp 98 F (36.7 C) (Oral)   Resp 20   Ht 6\' 1"  (1.854 m)   Wt 77.1 kg   SpO2 95%   BMI 22.43 kg/m  Physical Exam General: nad Cardiac: well perfused Lungs: even and unlabored Psych: calm  ED Course / MDM  EKG:    I have reviewed the labs performed to date as well as medications administered while in observation.  Recent changes in the last 24 hours include .  Plan  Current plan is for psych dispo. Patient is under full IVC at this time.   Merlyn Lot, MD 02/04/20 0730

## 2020-02-04 NOTE — ED Notes (Signed)
IVC, pend placement 

## 2020-02-04 NOTE — BH Assessment (Signed)
Writer followed up with referral to [facility] ([phone]), pending review. Was advised to call back this afternoon.

## 2020-02-05 DIAGNOSIS — F3132 Bipolar disorder, current episode depressed, moderate: Secondary | ICD-10-CM | POA: Diagnosis not present

## 2020-02-05 MED ORDER — GABAPENTIN 300 MG PO CAPS: 300.0000 mg | ORAL_CAPSULE | Freq: Three times a day (TID) | ORAL | 1 refills | Status: DC

## 2020-02-05 MED ORDER — QUETIAPINE FUMARATE 200 MG PO TABS: 200.0000 mg | ORAL_TABLET | Freq: Every day | ORAL | 0 refills | Status: DC

## 2020-02-05 NOTE — ED Notes (Signed)
Unable to obtain vitals due to patient sleeping.

## 2020-02-05 NOTE — ED Provider Notes (Signed)
Emergency Medicine Observation Re-evaluation Note  [name] is a 49 y.o. male, seen on rounds today.  Pt initially presented to the ED for complaints of IVC Currently, the patient is calm, resting, slept overnight.  Physical Exam  BP 116/80 (BP Location: Left Arm)   Pulse 90   Temp 98.2 F (36.8 C) (Oral)   Resp 14   Ht 6\' 1"  (1.854 m)   Wt 77.1 kg   SpO2 99%   BMI 22.43 kg/m  Physical Exam General: NAD Cardiac: well-perfused Lungs: normal WOB, no distress Psych: calm  ED Course / MDM  EKG:    I have reviewed the labs performed to date as well as medications administered while in observation.  Recent changes in the last 24 hours include none.  Plan  Current plan is for psych disposition. Patient is under full IVC at this time.   Duffy Bruce, MD 02/05/20 [phone]

## 2020-02-05 NOTE — ED Notes (Signed)
Pt given breakfast tray
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
## 2020-02-05 NOTE — BH Assessment (Signed)
Writer spoke with the patient to complete an updated/reassessment. Patient denies SI/HI and AV/H. NP and Probation officer spoke with patient's mother [name]), she states she will remove the other gun from the home, prior to coming to get the patient for discharge.

## 2020-02-05 NOTE — Discharge Instructions (Signed)
Follow up with out patient provider

## 2020-02-25 ENCOUNTER — Encounter (HOSPITAL_COMMUNITY): Payer: Self-pay | Admitting: Psychiatry

## 2020-02-25 ENCOUNTER — Other Ambulatory Visit: Payer: Self-pay

## 2020-02-25 ENCOUNTER — Telehealth (INDEPENDENT_AMBULATORY_CARE_PROVIDER_SITE_OTHER): Payer: Medicare HMO | Admitting: Psychiatry

## 2020-02-25 DIAGNOSIS — F3170 Bipolar disorder, currently in remission, most recent episode unspecified: Secondary | ICD-10-CM | POA: Diagnosis not present

## 2020-02-25 MED ORDER — FLUVOXAMINE MALEATE 100 MG PO TABS: 100.0000 mg | ORAL_TABLET | Freq: Every day | ORAL | 0 refills | Status: DC

## 2020-02-25 MED ORDER — CLONAZEPAM 0.5 MG PO TABS: 0.5000 mg | ORAL_TABLET | Freq: Two times a day (BID) | ORAL | 2 refills | Status: DC | PRN

## 2020-02-25 MED ORDER — TRAZODONE HCL 150 MG PO TABS: 150.0000 mg | ORAL_TABLET | Freq: Every evening | ORAL | 0 refills | Status: DC | PRN
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
## 2020-03-20 ENCOUNTER — Encounter (HOSPITAL_COMMUNITY): Payer: Self-pay | Admitting: Psychiatry

## 2020-03-20 NOTE — Progress Notes (Signed)
Patient requested a letter to be excused from jury duty.  Letter was issued and emailed to the email address provided by the patient.

## 2020-04-04 IMAGING — CT CT ANGIO CHEST
2 of 6 series · 18 of 46 positions shown · IV contrast (omnipaque)
Comparison: 03/09/2017

CLINICAL DATA: Shortness of breath

EXAM:
CT ANGIOGRAPHY CHEST WITH CONTRAST
TECHNIQUE: Multidetector CT imaging of the chest was performed using the
standard protocol during bolus administration of intravenous
contrast. Multiplanar CT image reconstructions and MIPs were
obtained to evaluate the vascular anatomy.
CONTRAST:  75mL OMNIPAQUE IOHEXOL 350 MG/ML SOLN

[Series 5: thins · axial · 0.77mm/px · z∈[-230,+9]mm · 15 of 263 slices shown]
[im 12/263  lung]
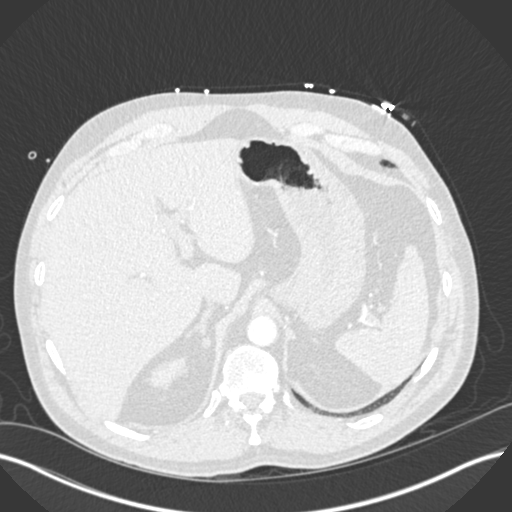
[im 35/263  soft-tissue]
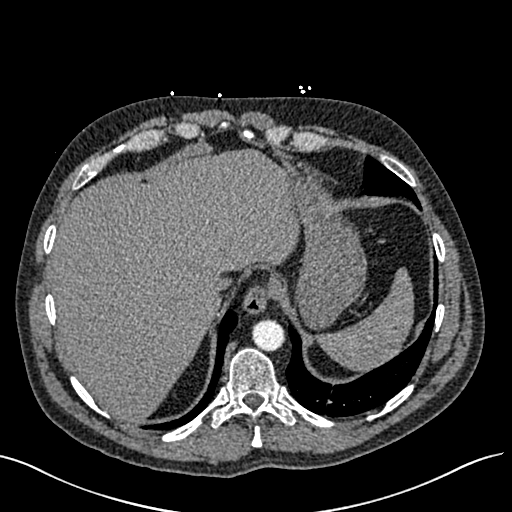
[im 46/263  lung]
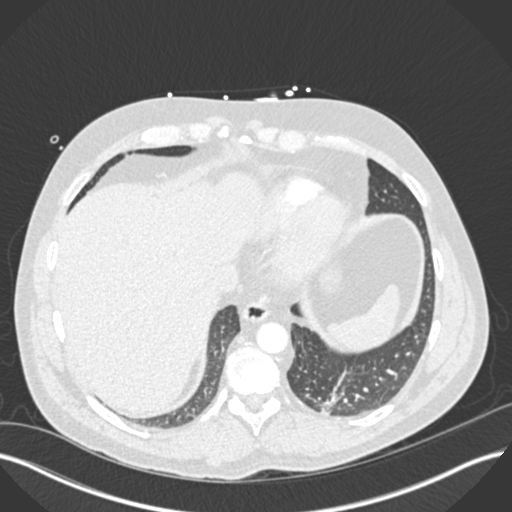
[im 69/263  soft-tissue]
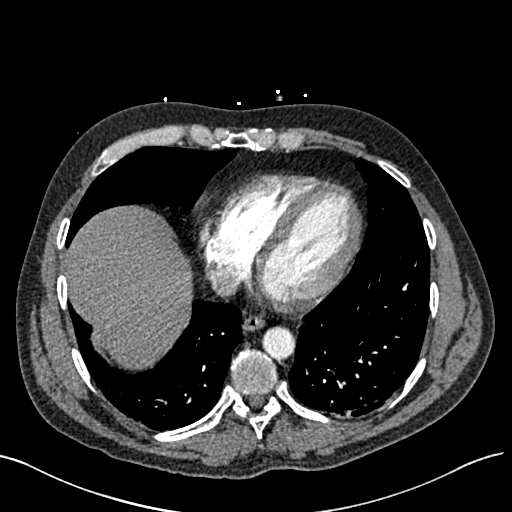
[im 80/263  lung]
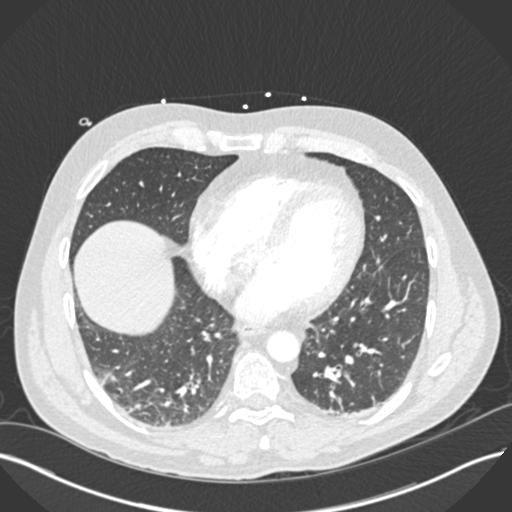
[im 103/263  soft-tissue]
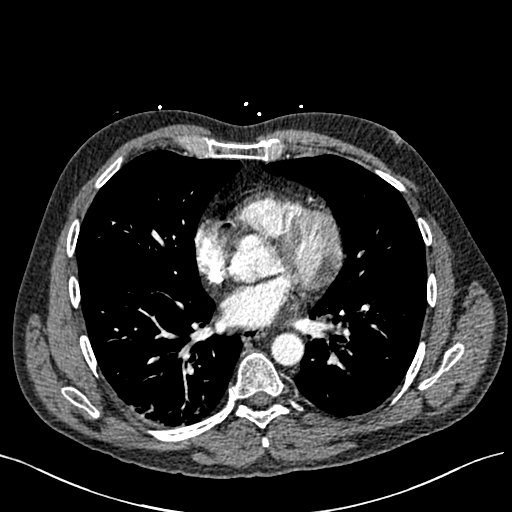
[im 114/263  lung]
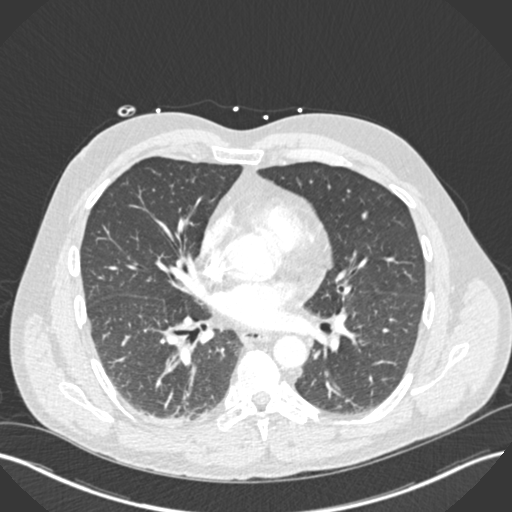
[im 137/263  soft-tissue]
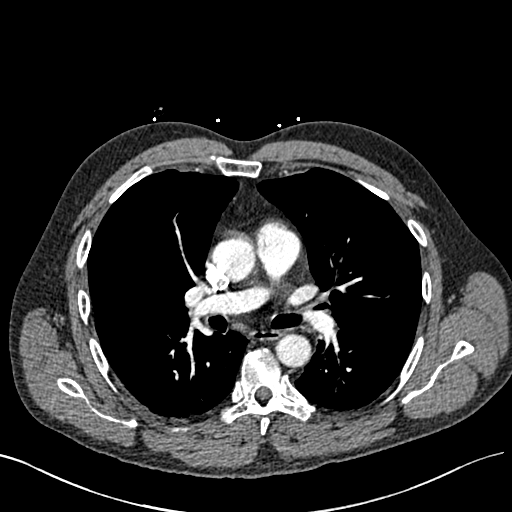
[im 149/263  lung]
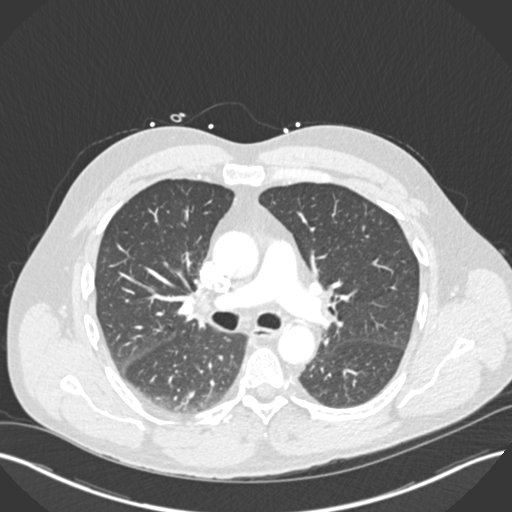
[im 160/263  soft-tissue]
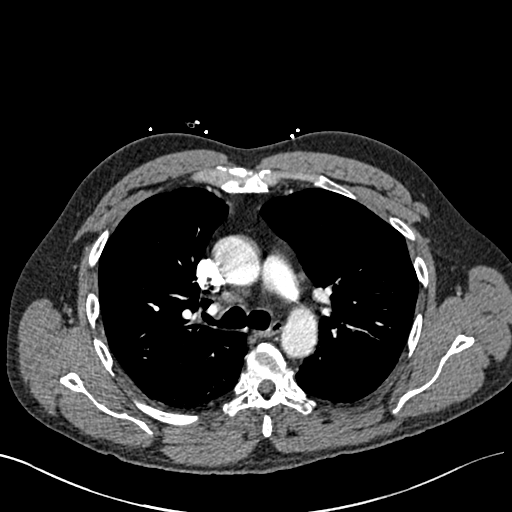
[im 183/263  lung]
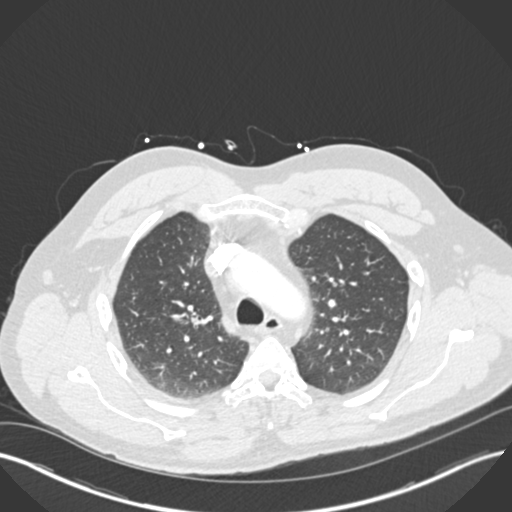
[im 194/263  soft-tissue]
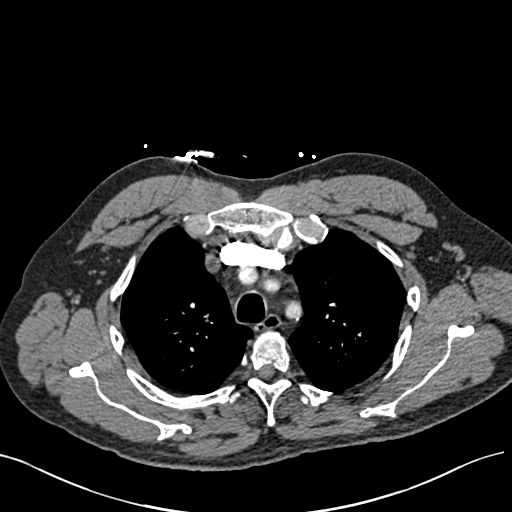
[im 217/263  lung]
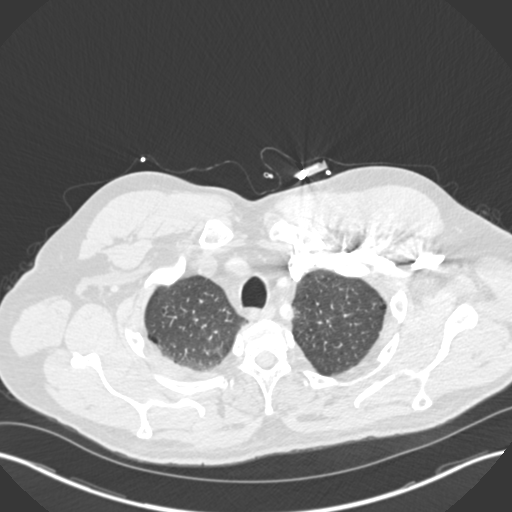
[im 228/263  soft-tissue]
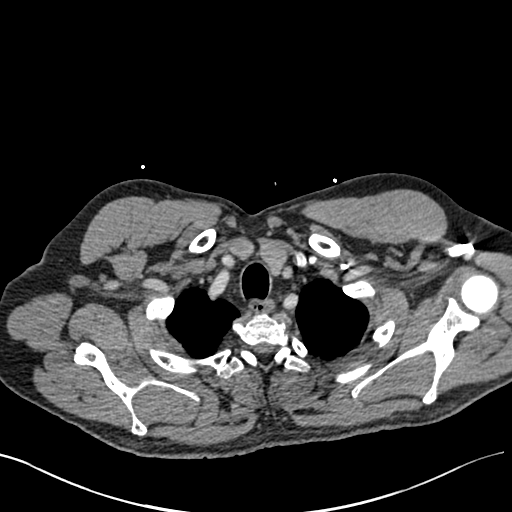
[im 251/263  lung]
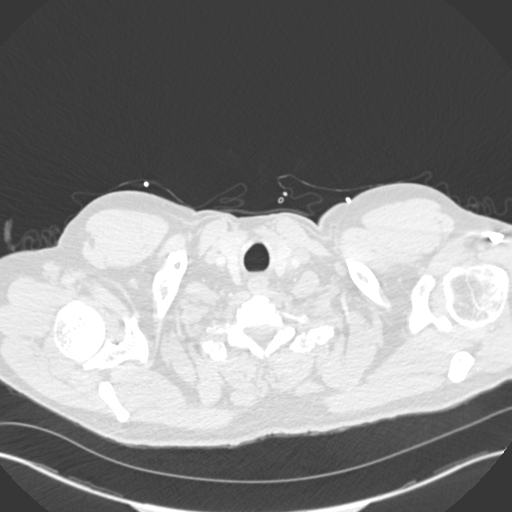

[Series 7: coronal mpr · coronal · 0.54mm/px · 3 of 85 slices shown]
[im 22/85  soft-tissue]
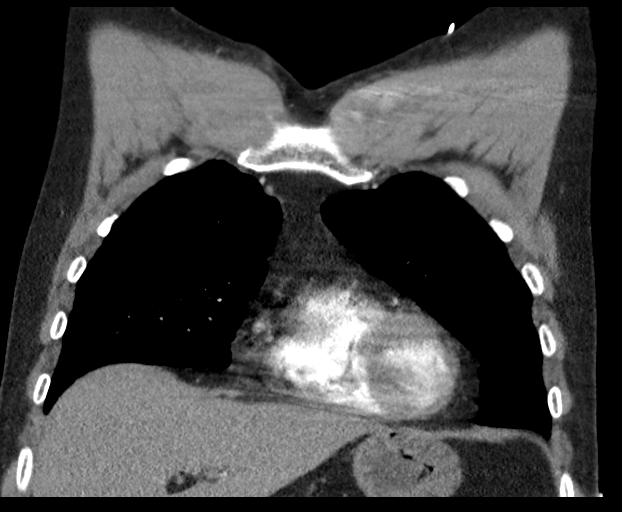
[im 43/85  soft-tissue]
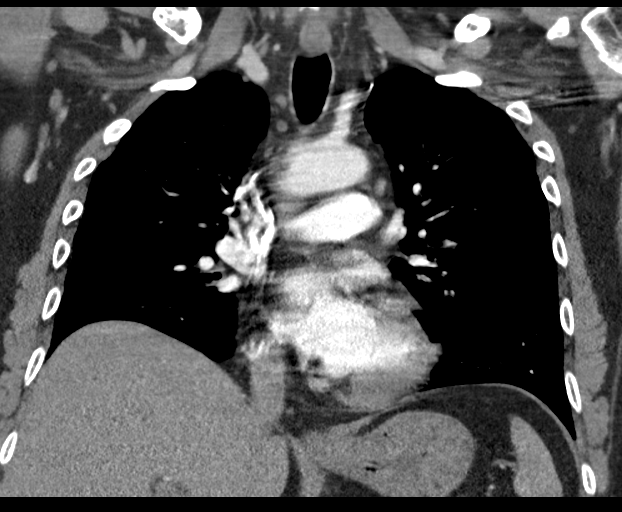
[im 64/85  soft-tissue]
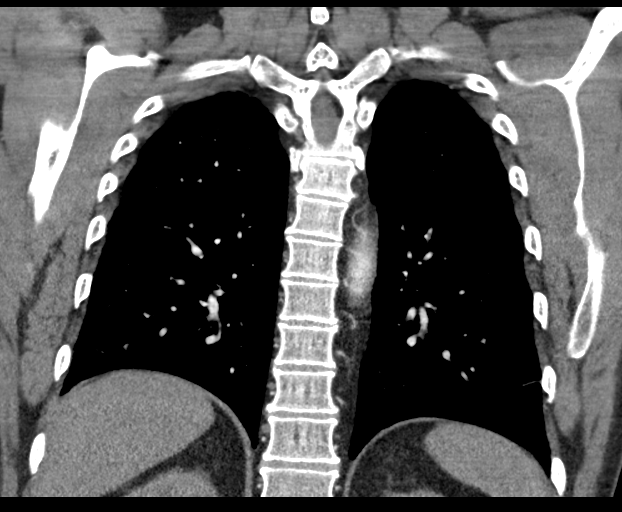

[18 of 46 positions shown; findings below may reference images not displayed]

FINDINGS: Cardiovascular: Thoracic aorta is well visualized without evidence
of aneurysmal dilatation or dissection. No cardiac enlargement is
seen. Scattered coronary calcifications are noted. The pulmonary
artery shows a normal branching pattern without focal intraluminal
filling defect to suggest pulmonary embolism.

Mediastinum/Nodes: Thoracic inlet is within normal limits. No hilar
or mediastinal adenopathy is noted. The esophagus as visualized is
within normal limits.

Lungs/Pleura: Lungs are well aerated bilaterally. Minimal bibasilar
atelectasis is seen in a dependent fashion. Small nodules are noted
1 on the left along the major fissure best seen on image number 45
of series 6 stable from the prior exam and likely representing a
subpleural lymph node. Additionally small subpleural nodules are
noted within the right middle lobe on image number 53 and 49 of
series 6. These are also stable from the prior exam. No focal
confluent infiltrate or effusion is noted.

Upper Abdomen: Visualized upper abdomen shows no acute abnormality.

Musculoskeletal: Healed right clavicular fracture is noted. Old
right rib fractures are noted. No acute bony abnormality is seen.

Review of the MIP images confirms the above findings.
IMPRESSION: No evidence pulmonary emboli.

Stable pulmonary nodules similar to that seen on the prior exam
consistent with benign etiology.

Mild basilar atelectasis bilaterally.

## 2020-05-06 DIAGNOSIS — J449 Chronic obstructive pulmonary disease, unspecified: Secondary | ICD-10-CM | POA: Diagnosis not present

## 2020-05-06 DIAGNOSIS — R0600 Dyspnea, unspecified: Secondary | ICD-10-CM | POA: Diagnosis not present

## 2020-05-07 ENCOUNTER — Other Ambulatory Visit: Payer: Self-pay

## 2020-05-07 ENCOUNTER — Telehealth (INDEPENDENT_AMBULATORY_CARE_PROVIDER_SITE_OTHER): Payer: Medicare HMO | Admitting: Psychiatry

## 2020-05-07 ENCOUNTER — Encounter (HOSPITAL_COMMUNITY): Payer: Self-pay | Admitting: Psychiatry

## 2020-05-07 DIAGNOSIS — F3170 Bipolar disorder, currently in remission, most recent episode unspecified: Secondary | ICD-10-CM

## 2020-05-07 MED ORDER — FLUVOXAMINE MALEATE 100 MG PO TABS
100.0000 mg | ORAL_TABLET | Freq: Every day | ORAL | 0 refills | Status: DC
Start: 2020-05-07 — End: 2020-07-03

## 2020-05-07 MED ORDER — CLONAZEPAM 0.5 MG PO TABS: 0.5000 mg | ORAL_TABLET | Freq: Two times a day (BID) | ORAL | 1 refills | Status: DC | PRN

## 2020-05-07 MED ORDER — TRAZODONE HCL 150 MG PO TABS: 150.0000 mg | ORAL_TABLET | Freq: Every evening | ORAL | 0 refills | Status: DC | PRN

## 2020-05-07 MED ORDER — QUETIAPINE FUMARATE 200 MG PO TABS: 200.0000 mg | ORAL_TABLET | Freq: Every day | ORAL | 0 refills | Status: DC
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
## 2020-06-25 DIAGNOSIS — E782 Mixed hyperlipidemia: Secondary | ICD-10-CM | POA: Diagnosis not present

## 2020-06-25 DIAGNOSIS — R7303 Prediabetes: Secondary | ICD-10-CM | POA: Diagnosis not present

## 2020-06-25 DIAGNOSIS — I10 Essential (primary) hypertension: Secondary | ICD-10-CM | POA: Diagnosis not present

## 2020-06-25 DIAGNOSIS — Z862 Personal history of diseases of the blood and blood-forming organs and certain disorders involving the immune mechanism: Secondary | ICD-10-CM | POA: Diagnosis not present

## 2020-07-02 DIAGNOSIS — D649 Anemia, unspecified: Secondary | ICD-10-CM | POA: Diagnosis not present

## 2020-07-02 DIAGNOSIS — E782 Mixed hyperlipidemia: Secondary | ICD-10-CM | POA: Diagnosis not present

## 2020-07-02 DIAGNOSIS — I10 Essential (primary) hypertension: Secondary | ICD-10-CM | POA: Diagnosis not present

## 2020-07-02 DIAGNOSIS — R7303 Prediabetes: Secondary | ICD-10-CM | POA: Diagnosis not present

## 2020-07-03 ENCOUNTER — Telehealth (INDEPENDENT_AMBULATORY_CARE_PROVIDER_SITE_OTHER): Payer: Medicare HMO | Admitting: Psychiatry

## 2020-07-03 ENCOUNTER — Other Ambulatory Visit: Payer: Self-pay

## 2020-07-03 ENCOUNTER — Encounter (HOSPITAL_COMMUNITY): Payer: Self-pay | Admitting: Psychiatry

## 2020-07-03 DIAGNOSIS — F3170 Bipolar disorder, currently in remission, most recent episode unspecified: Secondary | ICD-10-CM | POA: Insufficient documentation

## 2020-07-03 MED ORDER — TRAZODONE HCL 150 MG PO TABS
150.0000 mg | ORAL_TABLET | Freq: Every evening | ORAL | 0 refills | Status: DC | PRN
Start: 2020-07-03 — End: 2020-10-19

## 2020-07-03 MED ORDER — QUETIAPINE FUMARATE 200 MG PO TABS: 200.0000 mg | ORAL_TABLET | Freq: Every day | ORAL | 0 refills | Status: DC

## 2020-07-03 MED ORDER — CLONAZEPAM 0.5 MG PO TABS: 0.5000 mg | ORAL_TABLET | Freq: Two times a day (BID) | ORAL | 1 refills | Status: DC | PRN

## 2020-07-03 MED ORDER — FLUVOXAMINE MALEATE 100 MG PO TABS: 100.0000 mg | ORAL_TABLET | Freq: Every day | ORAL | 0 refills | Status: DC
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
## 2020-07-09 DIAGNOSIS — R569 Unspecified convulsions: Secondary | ICD-10-CM | POA: Diagnosis not present

## 2020-07-09 DIAGNOSIS — R4189 Other symptoms and signs involving cognitive functions and awareness: Secondary | ICD-10-CM | POA: Diagnosis not present
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
## 2020-08-28 ENCOUNTER — Telehealth (INDEPENDENT_AMBULATORY_CARE_PROVIDER_SITE_OTHER): Payer: Medicare HMO | Admitting: Psychiatry

## 2020-08-28 ENCOUNTER — Other Ambulatory Visit: Payer: Self-pay

## 2020-08-28 ENCOUNTER — Encounter: Payer: Self-pay | Admitting: Psychiatry

## 2020-08-28 DIAGNOSIS — F3175 Bipolar disorder, in partial remission, most recent episode depressed: Secondary | ICD-10-CM

## 2020-08-28 NOTE — Patient Instructions (Signed)
  Continue fluovoxamine 100 mg at night Continue quetiapine 200 mg at night  Continue gabapentin 300 mg three times a day Continue clonazepam 0.5 mg twice a day Continue Trazodone 150 mg at night as needed for sleep Next appointment 8/8 at 10:30   The next visit will be in person visit. Please arrive 15 mins before the scheduled time.   [facility]  Address: [address], [place], [place] [address]

## 2020-09-02 DIAGNOSIS — H524 Presbyopia: Secondary | ICD-10-CM | POA: Diagnosis not present
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
## 2020-10-19 ENCOUNTER — Ambulatory Visit (INDEPENDENT_AMBULATORY_CARE_PROVIDER_SITE_OTHER): Payer: Medicare HMO | Admitting: Psychiatry

## 2020-10-19 ENCOUNTER — Encounter: Payer: Self-pay | Admitting: Psychiatry

## 2020-10-19 ENCOUNTER — Telehealth: Payer: Self-pay | Admitting: Psychiatry

## 2020-10-19 ENCOUNTER — Other Ambulatory Visit: Payer: Self-pay

## 2020-10-19 VITALS — BP 110/70 | HR 70 | Temp 97.3°F | Wt 182.6 lb

## 2020-10-19 DIAGNOSIS — F3170 Bipolar disorder, currently in remission, most recent episode unspecified: Secondary | ICD-10-CM

## 2020-10-19 DIAGNOSIS — G4700 Insomnia, unspecified: Secondary | ICD-10-CM

## 2020-10-19 MED ORDER — CLONAZEPAM 0.5 MG PO TABS: 0.5000 mg | ORAL_TABLET | Freq: Two times a day (BID) | ORAL | 2 refills | Status: DC | PRN

## 2020-10-19 MED ORDER — TRAZODONE HCL 150 MG PO TABS: 150.0000 mg | ORAL_TABLET | Freq: Every evening | ORAL | 0 refills | Status: DC | PRN

## 2020-10-19 MED ORDER — QUETIAPINE FUMARATE 200 MG PO TABS: 200.0000 mg | ORAL_TABLET | Freq: Every day | ORAL | 0 refills | Status: DC

## 2020-10-19 NOTE — Telephone Encounter (Signed)
According to the information from pharmacy, the following medication have not been filled since 05/07/2020 : Quetiapine, fluvoxamine, Trazodone, and gabapentin is not filled since last year.   Could you contact the patient, review his medication and advise him the following:  - Restart quetiapine 200 mg at night  - Restart fluvoxamine 100 mg daily  - Restart Trazodone 150 mg at night AS NEEDED for sleep - Discontinue gabapentin at this time.
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
## 2020-11-24 ENCOUNTER — Encounter: Payer: Self-pay | Admitting: Psychiatry

## 2020-11-24 ENCOUNTER — Telehealth (INDEPENDENT_AMBULATORY_CARE_PROVIDER_SITE_OTHER): Payer: Medicare HMO | Admitting: Psychiatry

## 2020-11-24 ENCOUNTER — Other Ambulatory Visit: Payer: Self-pay

## 2020-11-24 DIAGNOSIS — G4700 Insomnia, unspecified: Secondary | ICD-10-CM | POA: Diagnosis not present

## 2020-11-24 DIAGNOSIS — F3170 Bipolar disorder, currently in remission, most recent episode unspecified: Secondary | ICD-10-CM

## 2020-11-24 MED ORDER — FLUVOXAMINE MALEATE 100 MG PO TABS: 100.0000 mg | ORAL_TABLET | Freq: Every day | ORAL | 1 refills | Status: DC

## 2020-11-24 NOTE — Patient Instructions (Signed)
Continue fluvoxamine 100 mg at night Continue quetiapine 200 mg at night  Continue clonazepam 0.5 mg twice a day Continue Trazodone 150 mg at night as needed for sleep Next appointment 11/3 at 3:30

## 2020-12-16 DIAGNOSIS — H60501 Unspecified acute noninfective otitis externa, right ear: Secondary | ICD-10-CM | POA: Diagnosis not present
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
## 2021-01-14 ENCOUNTER — Telehealth (INDEPENDENT_AMBULATORY_CARE_PROVIDER_SITE_OTHER): Payer: Medicare HMO | Admitting: Psychiatry

## 2021-01-14 ENCOUNTER — Encounter: Payer: Self-pay | Admitting: Psychiatry

## 2021-01-14 ENCOUNTER — Other Ambulatory Visit: Payer: Self-pay

## 2021-01-14 DIAGNOSIS — F3170 Bipolar disorder, currently in remission, most recent episode unspecified: Secondary | ICD-10-CM

## 2021-01-14 DIAGNOSIS — G4700 Insomnia, unspecified: Secondary | ICD-10-CM

## 2021-01-14 MED ORDER — TRAZODONE HCL 150 MG PO TABS: 150.0000 mg | ORAL_TABLET | Freq: Every evening | ORAL | 0 refills | Status: DC | PRN

## 2021-01-14 MED ORDER — CLONAZEPAM 0.5 MG PO TABS: 0.5000 mg | ORAL_TABLET | Freq: Two times a day (BID) | ORAL | 2 refills | Status: DC | PRN

## 2021-01-14 MED ORDER — QUETIAPINE FUMARATE 200 MG PO TABS
200.0000 mg | ORAL_TABLET | Freq: Every day | ORAL | 0 refills | Status: DC
Start: 2021-01-14 — End: 2021-04-05

## 2021-01-14 NOTE — Patient Instructions (Signed)
Continue fluvoxamine 100 mg at night Continue quetiapine 200 mg at night  Continue clonazepam 0.5 mg twice a day Continue Trazodone 150 mg at night as needed for sleep Next appointment 1/23 at 9 AM for 30 mins,  in person

## 2021-02-12 DIAGNOSIS — Z0000 Encounter for general adult medical examination without abnormal findings: Secondary | ICD-10-CM | POA: Diagnosis not present

## 2021-02-12 DIAGNOSIS — E782 Mixed hyperlipidemia: Secondary | ICD-10-CM | POA: Diagnosis not present

## 2021-02-12 DIAGNOSIS — Z862 Personal history of diseases of the blood and blood-forming organs and certain disorders involving the immune mechanism: Secondary | ICD-10-CM | POA: Diagnosis not present
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
## 2021-04-05 ENCOUNTER — Other Ambulatory Visit: Payer: Self-pay

## 2021-04-05 ENCOUNTER — Telehealth: Payer: Self-pay

## 2021-04-05 ENCOUNTER — Ambulatory Visit (INDEPENDENT_AMBULATORY_CARE_PROVIDER_SITE_OTHER): Payer: Medicare HMO | Admitting: Psychiatry

## 2021-04-05 ENCOUNTER — Encounter: Payer: Self-pay | Admitting: Psychiatry

## 2021-04-05 VITALS — BP 121/74 | HR 84 | Temp 98.1°F | Wt 209.0 lb

## 2021-04-05 DIAGNOSIS — F3170 Bipolar disorder, currently in remission, most recent episode unspecified: Secondary | ICD-10-CM | POA: Diagnosis not present

## 2021-04-05 DIAGNOSIS — G4700 Insomnia, unspecified: Secondary | ICD-10-CM

## 2021-04-05 MED ORDER — CLONAZEPAM 0.5 MG PO TABS: 0.5000 mg | ORAL_TABLET | Freq: Two times a day (BID) | ORAL | 2 refills | Status: DC | PRN

## 2021-04-05 MED ORDER — ARIPIPRAZOLE 10 MG PO TABS: 10.0000 mg | ORAL_TABLET | Freq: Every day | ORAL | 0 refills | Status: DC

## 2021-04-05 MED ORDER — ARIPIPRAZOLE 10 MG PO TABS: ORAL_TABLET | ORAL | 0 refills | Status: DC

## 2021-04-05 MED ORDER — TRAZODONE HCL 150 MG PO TABS: 150.0000 mg | ORAL_TABLET | Freq: Every evening | ORAL | 0 refills | Status: DC | PRN

## 2021-04-05 NOTE — Telephone Encounter (Signed)
ARIPiprazole (ABILIFY) 10 MG tablet 30 tablet 0 04/05/2021 05/05/2021   Sig - Route: Take 1 tablet (10 mg total) by mouth daily. - Oral   Sent to pharmacy as: ARIPiprazole (ABILIFY) 10 MG tablet   Notes to Pharmacy: 5 mg daily for one week, then 10 mg daily.     pharamcy states that the Note : needs to be put in the Sig: section. that the taper you have in the note section needs to be in the direction please resend rx

## 2021-04-05 NOTE — Telephone Encounter (Signed)
Ordered. Please cancel the previous order.

## 2021-04-05 NOTE — Patient Instructions (Signed)
Continue fluvoxamine 100 mg at night Start Abilify 5 mg daily for one week, then 10 mg daily  Discontinue quetiapine Continue clonazepam 0.5 mg twice a day Continue Trazodone 150 mg at night as needed for sleep Next appointment 2/13 at 8:30

## 2021-04-05 NOTE — Addendum Note (Signed)
Addended by: Norman Clay on: 04/05/2021 04:33 PM   Modules accepted: Orders

## 2021-04-08 ENCOUNTER — Other Ambulatory Visit: Payer: Self-pay | Admitting: Psychiatry

## 2021-04-08 DIAGNOSIS — F3170 Bipolar disorder, currently in remission, most recent episode unspecified: Secondary | ICD-10-CM

## 2021-04-08 MED ORDER — CLONAZEPAM 0.5 MG PO TABS: 0.5000 mg | ORAL_TABLET | Freq: Two times a day (BID) | ORAL | 2 refills | Status: DC | PRN

## 2021-04-08 NOTE — Telephone Encounter (Signed)
Discussed with Estill Bamberg.  - resent clonazepam order - discontinue trazodone order (he has not filled this since August).

## 2021-04-08 NOTE — Telephone Encounter (Signed)
[name] from [facility] needs you to call her about 2 medications.

## 2021-04-15 ENCOUNTER — Telehealth: Payer: Self-pay

## 2021-04-15 ENCOUNTER — Other Ambulatory Visit: Payer: Self-pay | Admitting: Psychiatry

## 2021-04-15 MED ORDER — OLANZAPINE 5 MG PO TABS: ORAL_TABLET | ORAL | 0 refills | Status: DC

## 2021-04-15 NOTE — Telephone Encounter (Signed)
pt mother left a message that she needed to speak with you about the abilify she states he stopping medication

## 2021-04-15 NOTE — Telephone Encounter (Signed)
Discussed with both the patient and his mother.  He started to have hives after starting Abilify.  He discontinued the medication today.  His mother states that he seems to be more irritable, and has paranoia.  He denies SI, HI.  His mother denies any safety concern.  His mother states that she heard that he used to be doing very well with olanzapine; he agreed to try this medication.  Discussed potential risk of metabolic side effect, drowsiness and EPS.  He agreed with the following plans.  -Discontinue Abilify -Start olanzapine 2.5 mg at night for 3 days, then 5 mg at night.  -Both the patient and his mother agreed to go to urgent care or emergency room if any worsening in hives and/or if develops any shortness of breath.
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
## 2021-04-26 ENCOUNTER — Other Ambulatory Visit: Payer: Self-pay

## 2021-04-26 ENCOUNTER — Ambulatory Visit (INDEPENDENT_AMBULATORY_CARE_PROVIDER_SITE_OTHER): Payer: Medicare HMO | Admitting: Psychiatry

## 2021-04-26 ENCOUNTER — Encounter: Payer: Self-pay | Admitting: Psychiatry

## 2021-04-26 VITALS — BP 145/75 | HR 67 | Temp 98.1°F | Wt 208.0 lb

## 2021-04-26 DIAGNOSIS — G4700 Insomnia, unspecified: Secondary | ICD-10-CM | POA: Diagnosis not present

## 2021-04-26 DIAGNOSIS — F3170 Bipolar disorder, currently in remission, most recent episode unspecified: Secondary | ICD-10-CM | POA: Diagnosis not present

## 2021-04-26 MED ORDER — OLANZAPINE 5 MG PO TABS: 5.0000 mg | ORAL_TABLET | Freq: Every day | ORAL | 0 refills | Status: DC

## 2021-04-26 NOTE — Patient Instructions (Signed)
Continue fluvoxamine 100 mg at night Continue olanzapine 5 mg at night   Continue clonazepam 0.5 mg twice a day Continue Trazodone 150 mg at night as needed for sleep Next appointment: 3/13 at 2:30
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
## 2021-05-24 ENCOUNTER — Other Ambulatory Visit: Payer: Self-pay

## 2021-05-24 ENCOUNTER — Encounter: Payer: Self-pay | Admitting: Psychiatry

## 2021-05-24 ENCOUNTER — Ambulatory Visit (INDEPENDENT_AMBULATORY_CARE_PROVIDER_SITE_OTHER): Payer: Medicare HMO | Admitting: Psychiatry

## 2021-05-24 VITALS — BP 112/71 | HR 92 | Temp 98.5°F | Wt 214.2 lb

## 2021-05-24 DIAGNOSIS — G4700 Insomnia, unspecified: Secondary | ICD-10-CM

## 2021-05-24 DIAGNOSIS — F3170 Bipolar disorder, currently in remission, most recent episode unspecified: Secondary | ICD-10-CM

## 2021-05-24 MED ORDER — FLUVOXAMINE MALEATE 100 MG PO TABS: 100.0000 mg | ORAL_TABLET | Freq: Every day | ORAL | 1 refills | Status: DC

## 2021-05-24 MED ORDER — OLANZAPINE 5 MG PO TABS: 5.0000 mg | ORAL_TABLET | Freq: Every day | ORAL | 0 refills | Status: DC

## 2021-05-24 NOTE — Patient Instructions (Signed)
Continue fluvoxamine 100 mg at night ?Continue olanzapine 5 mg at night   ?Continue clonazepam 0.5 mg twice a day ?Continue Trazodone 150 mg at night as needed for sleep ?Next appointment: 5/8  at 2:30  ?

## 2021-05-27 ENCOUNTER — Emergency Department
Admission: EM | Admit: 2021-05-27 | Discharge: 2021-05-27 | Disposition: A | Discharge status: Home / Self Care | Payer: Medicare HMO | Attending: Emergency Medicine | Admitting: Emergency Medicine

## 2021-05-27 ENCOUNTER — Other Ambulatory Visit: Payer: Self-pay

## 2021-05-27 ENCOUNTER — Inpatient Hospital Stay
Admission: AD | Admit: 2021-05-27 | Discharge: 2021-06-01 | DRG: 885 | Disposition: A | Discharge status: Home / Self Care | Payer: Medicare HMO | Source: Intra-hospital | Attending: Psychiatry | Admitting: Psychiatry

## 2021-05-27 ENCOUNTER — Encounter: Payer: Self-pay | Admitting: Psychiatry

## 2021-05-27 DIAGNOSIS — J438 Other emphysema: Secondary | ICD-10-CM | POA: Diagnosis not present

## 2021-05-27 DIAGNOSIS — Z818 Family history of other mental and behavioral disorders: Secondary | ICD-10-CM | POA: Diagnosis not present

## 2021-05-27 DIAGNOSIS — I10 Essential (primary) hypertension: Secondary | ICD-10-CM | POA: Diagnosis not present

## 2021-05-27 DIAGNOSIS — F1210 Cannabis abuse, uncomplicated: Secondary | ICD-10-CM

## 2021-05-27 DIAGNOSIS — F03918 Unspecified dementia, unspecified severity, with other behavioral disturbance: Secondary | ICD-10-CM | POA: Diagnosis present

## 2021-05-27 DIAGNOSIS — K219 Gastro-esophageal reflux disease without esophagitis: Secondary | ICD-10-CM | POA: Diagnosis present

## 2021-05-27 DIAGNOSIS — Z811 Family history of alcohol abuse and dependence: Secondary | ICD-10-CM

## 2021-05-27 DIAGNOSIS — F4310 Post-traumatic stress disorder, unspecified: Secondary | ICD-10-CM | POA: Diagnosis present

## 2021-05-27 DIAGNOSIS — G4700 Insomnia, unspecified: Secondary | ICD-10-CM | POA: Diagnosis not present

## 2021-05-27 DIAGNOSIS — F1510 Other stimulant abuse, uncomplicated: Secondary | ICD-10-CM | POA: Diagnosis not present

## 2021-05-27 DIAGNOSIS — F319 Bipolar disorder, unspecified: Secondary | ICD-10-CM | POA: Diagnosis not present

## 2021-05-27 DIAGNOSIS — F22 Delusional disorders: Secondary | ICD-10-CM | POA: Insufficient documentation

## 2021-05-27 DIAGNOSIS — F429 Obsessive-compulsive disorder, unspecified: Secondary | ICD-10-CM | POA: Diagnosis present

## 2021-05-27 DIAGNOSIS — F3132 Bipolar disorder, current episode depressed, moderate: Secondary | ICD-10-CM | POA: Diagnosis not present

## 2021-05-27 DIAGNOSIS — R451 Restlessness and agitation: Secondary | ICD-10-CM | POA: Diagnosis not present

## 2021-05-27 DIAGNOSIS — F3170 Bipolar disorder, currently in remission, most recent episode unspecified: Secondary | ICD-10-CM

## 2021-05-27 DIAGNOSIS — Z046 Encounter for general psychiatric examination, requested by authority: Secondary | ICD-10-CM | POA: Diagnosis present

## 2021-05-27 DIAGNOSIS — Z20822 Contact with and (suspected) exposure to covid-19: Secondary | ICD-10-CM | POA: Diagnosis not present

## 2021-05-27 DIAGNOSIS — F17290 Nicotine dependence, other tobacco product, uncomplicated: Secondary | ICD-10-CM | POA: Diagnosis present

## 2021-05-27 DIAGNOSIS — Z79899 Other long term (current) drug therapy: Secondary | ICD-10-CM | POA: Insufficient documentation

## 2021-05-27 DIAGNOSIS — Z87820 Personal history of traumatic brain injury: Secondary | ICD-10-CM

## 2021-05-27 DIAGNOSIS — Z809 Family history of malignant neoplasm, unspecified: Secondary | ICD-10-CM

## 2021-05-27 LAB — COMPREHENSIVE METABOLIC PANEL
ALT: 20 U/L (ref 0–44)
AST: 19 U/L (ref 15–41)
Albumin: 4.4 g/dL (ref 3.5–5.0)
Alkaline Phosphatase: 91 U/L (ref 38–126)
Anion gap: 9 (ref 5–15)
BUN: 17 mg/dL (ref 6–20)
CO2: 26 mmol/L (ref 22–32)
Calcium: 9.3 mg/dL (ref 8.9–10.3)
Chloride: 101 mmol/L (ref 98–111)
Creatinine, Ser: 1.06 mg/dL (ref 0.61–1.24)
GFR, Estimated: >60 mL/min (ref >60)
Glucose, Bld: 112 mg/dL — ABNORMAL HIGH (ref 70–99)
Potassium: 3.9 mmol/L (ref 3.5–5.1)
Sodium: 136 mmol/L (ref 135–145)
Total Bilirubin: 0.6 mg/dL (ref 0.3–1.2)
Total Protein: 8.2 g/dL — ABNORMAL HIGH (ref 6.5–8.1)

## 2021-05-27 LAB — URINE DRUG SCREEN, QUALITATIVE (ARMC ONLY)
Amphetamines, Ur Screen: POSITIVE — AB
Barbiturates, Ur Screen: NOT DETECTED
Benzodiazepine, Ur Scrn: POSITIVE — AB
Cannabinoid 50 Ng, Ur ~~LOC~~: POSITIVE — AB
Cocaine Metabolite,Ur ~~LOC~~: NOT DETECTED
MDMA (Ecstasy)Ur Screen: NOT DETECTED
Methadone Scn, Ur: NOT DETECTED
Opiate, Ur Screen: NOT DETECTED
Phencyclidine (PCP) Ur S: NOT DETECTED
Tricyclic, Ur Screen: NOT DETECTED

## 2021-05-27 LAB — CBC
HCT: 39.6 % (ref 39.0–52.0)
Hemoglobin: 12.8 g/dL — ABNORMAL LOW (ref 13.0–17.0)
MCH: 27.3 pg (ref 26.0–34.0)
MCHC: 32.3 g/dL (ref 30.0–36.0)
MCV: 84.4 fL (ref 80.0–100.0)
Platelets: 262 10*3/uL (ref 150–400)
RBC: 4.69 MIL/uL (ref 4.22–5.81)
RDW: 13.8 % (ref 11.5–15.5)
WBC: 8.9 10*3/uL (ref 4.0–10.5)
nRBC: 0 % (ref 0.0–0.2)

## 2021-05-27 LAB — RESP PANEL BY RT-PCR (FLU A&B, COVID) ARPGX2
Influenza A by PCR: NEGATIVE
Influenza B by PCR: NEGATIVE
SARS Coronavirus 2 by RT PCR: NEGATIVE

## 2021-05-27 LAB — ACETAMINOPHEN LEVEL: Acetaminophen (Tylenol), Serum: <10 ug/mL — ABNORMAL LOW (ref 10–30)

## 2021-05-27 LAB — SALICYLATE LEVEL: Salicylate Lvl: <7 mg/dL — ABNORMAL LOW (ref 7.0–30.0)

## 2021-05-27 LAB — ETHANOL: Alcohol, Ethyl (B): <10 mg/dL (ref <10)

## 2021-05-27 MED ORDER — OLANZAPINE 5 MG PO TABS
5.0000 mg | ORAL_TABLET | Freq: Every day | ORAL | Status: DC
  Administered 2021-05-27: 5 mg via ORAL
  Filled 2021-05-27: qty 1

## 2021-05-27 MED ORDER — VITAMIN B-12 1000 MCG PO TABS: 1000.0000 ug | ORAL_TABLET | Freq: Every day | ORAL | Status: DC

## 2021-05-27 MED ORDER — VITAMIN B-12 1000 MCG PO TABS
1000.0000 ug | ORAL_TABLET | Freq: Every day | ORAL | Status: DC
  Administered 2021-05-28 – 2021-06-01 (×5): 1000 ug via ORAL
  Filled 2021-05-27 (×5): qty 1

## 2021-05-27 MED ORDER — OLANZAPINE 5 MG PO TABS: 5.0000 mg | ORAL_TABLET | Freq: Every day | ORAL | Status: DC

## 2021-05-27 MED ORDER — ALUM & MAG HYDROXIDE-SIMETH 200-200-20 MG/5ML PO SUSP: 30.0000 mL | ORAL | Status: DC | PRN

## 2021-05-27 MED ORDER — MAGNESIUM HYDROXIDE 400 MG/5ML PO SUSP: 30.0000 mL | Freq: Every day | ORAL | Status: DC | PRN

## 2021-05-27 MED ORDER — ACETAMINOPHEN 325 MG PO TABS
650.0000 mg | ORAL_TABLET | Freq: Four times a day (QID) | ORAL | Status: DC | PRN
  Administered 2021-05-28 – 2021-05-31 (×2): 650 mg via ORAL
  Filled 2021-05-27 (×2): qty 2

## 2021-05-27 MED ORDER — FLUVOXAMINE MALEATE 50 MG PO TABS
100.0000 mg | ORAL_TABLET | Freq: Every day | ORAL | Status: DC
  Filled 2021-05-27: qty 2

## 2021-05-27 MED ORDER — CLONAZEPAM 0.5 MG PO TABS
0.5000 mg | ORAL_TABLET | Freq: Two times a day (BID) | ORAL | Status: DC | PRN
  Administered 2021-05-27: 0.5 mg via ORAL
  Filled 2021-05-27: qty 1

## 2021-05-27 MED ORDER — CLONAZEPAM 0.5 MG PO TABS
0.5000 mg | ORAL_TABLET | Freq: Two times a day (BID) | ORAL | Status: DC | PRN
  Administered 2021-05-27 – 2021-06-01 (×10): 0.5 mg via ORAL
  Filled 2021-05-27 (×10): qty 1

## 2021-05-27 MED ORDER — FLUVOXAMINE MALEATE 50 MG PO TABS
100.0000 mg | ORAL_TABLET | Freq: Every day | ORAL | Status: DC
  Administered 2021-05-27 – 2021-05-31 (×5): 100 mg via ORAL
  Filled 2021-05-27 (×5): qty 2

## 2021-05-27 MED ORDER — PANTOPRAZOLE SODIUM 40 MG PO TBEC: 40.0000 mg | DELAYED_RELEASE_TABLET | Freq: Two times a day (BID) | ORAL | Status: DC

## 2021-05-27 MED ORDER — ALBUTEROL SULFATE (2.5 MG/3ML) 0.083% IN NEBU
3.0000 mL | INHALATION_SOLUTION | Freq: Four times a day (QID) | RESPIRATORY_TRACT | Status: DC | PRN
  Filled 2021-05-27: qty 3

## 2021-05-27 MED ORDER — PANTOPRAZOLE SODIUM 40 MG PO TBEC
40.0000 mg | DELAYED_RELEASE_TABLET | Freq: Two times a day (BID) | ORAL | Status: DC
  Administered 2021-05-27 – 2021-06-01 (×11): 40 mg via ORAL
  Filled 2021-05-27 (×11): qty 1

## 2021-05-27 MED ORDER — FLUTICASONE FUROATE-VILANTEROL 100-25 MCG/ACT IN AEPB
1.0000 | INHALATION_SPRAY | Freq: Every day | RESPIRATORY_TRACT | Status: DC
  Filled 2021-05-27: qty 28

## 2021-05-27 MED ORDER — TRAZODONE HCL 50 MG PO TABS: 150.0000 mg | ORAL_TABLET | Freq: Every day | ORAL | Status: DC

## 2021-05-27 MED ORDER — ALBUTEROL SULFATE (2.5 MG/3ML) 0.083% IN NEBU: 3.0000 mL | INHALATION_SOLUTION | Freq: Four times a day (QID) | RESPIRATORY_TRACT | Status: DC | PRN

## 2021-05-27 MED ORDER — FLUTICASONE FUROATE-VILANTEROL 100-25 MCG/ACT IN AEPB
1.0000 | INHALATION_SPRAY | Freq: Every day | RESPIRATORY_TRACT | Status: DC
  Administered 2021-05-28 – 2021-06-01 (×3): 1 via RESPIRATORY_TRACT
  Filled 2021-05-27: qty 28

## 2021-05-27 MED ORDER — TRAZODONE HCL 50 MG PO TABS
150.0000 mg | ORAL_TABLET | Freq: Every day | ORAL | Status: DC
  Administered 2021-05-27 – 2021-05-31 (×5): 150 mg via ORAL
  Filled 2021-05-27 (×5): qty 1

## 2021-05-27 MED ORDER — LAMOTRIGINE ER 100 MG PO TB24
200.0000 mg | ORAL_TABLET | Freq: Every day | ORAL | Status: DC
  Filled 2021-05-27: qty 2

## 2021-05-27 MED ORDER — LAMOTRIGINE ER 100 MG PO TB24
200.0000 mg | ORAL_TABLET | Freq: Every day | ORAL | Status: DC
  Administered 2021-05-28 – 2021-05-29 (×2): 200 mg via ORAL
  Filled 2021-05-27 (×3): qty 2
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
## 2021-05-27 NOTE — ED Notes (Signed)
IVC  NP recommends inpatient admit ?
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
## 2021-05-27 NOTE — ED Notes (Signed)
Report received from [place], [place]. Patient currently sleeping, respirations regular and unlabored. Q15 minute rounds and observation by Engineer, drilling to continue. Will assess patient once awake.  ?

## 2021-05-27 NOTE — Plan of Care (Signed)
Patient new to the unit tonight, hasn't had time to progress ? ?Problem: Education: ?Goal: Knowledge of [facility] Education information/materials will improve ?Outcome: Not Progressing ?Goal: Emotional status will improve ?Outcome: Not Progressing ?Goal: Mental status will improve ?Outcome: Not Progressing ?Goal: Verbalization of understanding the information provided will improve ?Outcome: Not Progressing ?  ?Problem: Education: ?Goal: Will be free of psychotic symptoms ?Outcome: Not Progressing ?Goal: Knowledge of the prescribed therapeutic regimen will improve ?Outcome: Not Progressing ?  ?Problem: Safety: ?Goal: Ability to redirect hostility and anger into socially appropriate behaviors will improve ?Outcome: Not Progressing ?Goal: Ability to remain free from injury will improve ?Outcome: Not Progressing ?  ?

## 2021-05-27 NOTE — ED Notes (Signed)
IVC 

## 2021-05-27 NOTE — Tx Team (Signed)
Initial Treatment Plan ?05/27/2021 ?8:31 PM ?[name] ?[id number] ? ? ? ?PATIENT STRESSORS: ?Health problems   ?Medication change or noncompliance   ? ? ?PATIENT STRENGTHS: ?Capable of independent living  ?Motivation for treatment/growth  ? ? ?PATIENT IDENTIFIED PROBLEMS: ?Anxiety  ?Paranoia  ?  ?  ?  ?  ?  ?  ?  ?  ? ?DISCHARGE CRITERIA:  ?Improved stabilization in mood, thinking, and/or behavior ?Motivation to continue treatment in a less acute level of care ? ?PRELIMINARY DISCHARGE PLAN: ?Outpatient therapy ?Return to previous living arrangement ? ?PATIENT/FAMILY INVOLVEMENT: ?This treatment plan has been presented to and reviewed with the patient, [name]. The patient has been given the opportunity to ask questions and make suggestions. ? ?Mallie Darting, RN ?05/27/2021, 8:31 PM ?

## 2021-05-27 NOTE — ED Notes (Signed)
Pt dressed out into hospital attire. Pt's belongings to include: ?1 camo shirt ?1 pair jeans ?2 brown shoes ?1 black necklace with blue gray ?2 white socks ?1 black hat ?1 black wallet with gray chain ?1 black hair tie ?1 set of keys ?1 brown belt ?1 blue underwear ? ? ?
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
## 2021-05-27 NOTE — Progress Notes (Signed)
Patient admitted from [facility] - ED, report received from Maudie [place], [place]. Pt presents with anger and HI towards his neighbors stating that they steal from him. Pt stated he would kill them if they did. Pt understands that he is here to help calm him down. Pt also presents with delusions of grandeur. Patient calm and cooperative with staff. Pt given education, support, and encouragement to be active in his treatment plan. Pt oriented to the unit and his room. Pt compliant with medication administration per MD orders. Pt being monitored Q 15 minutes for safety per unit protocol. Pt remains safe on the unit.  ?

## 2021-05-28 DIAGNOSIS — F1510 Other stimulant abuse, uncomplicated: Secondary | ICD-10-CM

## 2021-05-28 DIAGNOSIS — F319 Bipolar disorder, unspecified: Secondary | ICD-10-CM

## 2021-05-28 DIAGNOSIS — F1210 Cannabis abuse, uncomplicated: Secondary | ICD-10-CM

## 2021-05-28 MED ORDER — DIVALPROEX SODIUM 250 MG PO DR TAB
250.0000 mg | DELAYED_RELEASE_TABLET | Freq: Two times a day (BID) | ORAL | Status: DC
  Administered 2021-05-28 – 2021-06-01 (×8): 250 mg via ORAL
  Filled 2021-05-28 (×8): qty 1

## 2021-05-28 MED ORDER — OLANZAPINE 10 MG PO TABS
10.0000 mg | ORAL_TABLET | Freq: Every day | ORAL | Status: DC
  Administered 2021-05-28 – 2021-05-31 (×4): 10 mg via ORAL
  Filled 2021-05-28 (×4): qty 1

## 2021-05-28 NOTE — Group Note (Signed)
[facility] LCSW Group Therapy Note ? ? ?Group Date: 05/28/2021 ?Start Time: 1300 ?End Time: 1400 ? ? ?Type of Therapy/Topic:  Group Therapy:  Emotion Regulation ? ?Participation Level:  Did Not Attend  ? ?Mood: ? ?Description of Group:   ? The purpose of this group is to assist patients in learning to regulate negative emotions and experience positive emotions. Patients will be guided to discuss ways in which they have been vulnerable to their negative emotions. These vulnerabilities will be juxtaposed with experiences of positive emotions or situations, and patients challenged to use positive emotions to combat negative ones. Special emphasis will be placed on coping with negative emotions in conflict situations, and patients will process healthy conflict resolution skills. ? ?Therapeutic Goals: ?Patient will identify two positive emotions or experiences to reflect on in order to balance out negative emotions:  ?Patient will label two or more emotions that they find the most difficult to experience:  ?Patient will be able to demonstrate positive conflict resolution skills through discussion or role plays:  ? ?Summary of Patient Progress: ?X ? ? ?Therapeutic Modalities:   ?Cognitive Behavioral Therapy ?Feelings Identification ?Dialectical Behavioral Therapy ? ? ?Rozann Lesches, LCSW ?

## 2021-05-28 NOTE — BH IP Treatment Plan (Signed)
Interdisciplinary Treatment and Diagnostic Plan Update ? ?05/28/2021 ?Time of Session: 9:30AM ?An A Dearmas ?MRN: [id number] ? ?Principal Diagnosis: Bipolar 1 disorder ([place]) ? ?Secondary Diagnoses: Principal Problem: ?  Bipolar 1 disorder ([place]) ? ? ?Current Medications:  ?Current Facility-Administered Medications  ?Medication Dose Route Frequency Provider Last Rate Last Admin  ? acetaminophen (TYLENOL) tablet 650 mg  650 mg Oral Q6H PRN Waldon Merl F, NP      ? albuterol (PROVENTIL) (2.5 MG/3ML) 0.083% nebulizer solution 3 mL  3 mL Inhalation Q6H PRN Sherlon Handing, NP      ? alum & mag hydroxide-simeth (MAALOX/MYLANTA) 200-200-20 MG/5ML suspension 30 mL  30 mL Oral Q4H PRN Waldon Merl F, NP      ? clonazePAM [place]bye [place]) tablet 0.5 mg  0.5 mg Oral BID PRN Sherlon Handing, NP   0.5 mg at 05/28/21 3419  ? fluticasone furoate-vilanterol (BREO ELLIPTA) 100-25 MCG/ACT 1 puff  1 puff Inhalation Daily Waldon Merl F, NP   1 puff at 05/28/21 0819  ? fluvoxaMINE (LUVOX) tablet 100 mg  100 mg Oral QHS Waldon Merl F, NP   100 mg at 05/27/21 2123  ? lamoTRIgine (LAMICTAL XR) 24 hour tablet 200 mg  200 mg Oral Daily Waldon Merl F, NP   200 mg at 05/28/21 3790  ? magnesium hydroxide (MILK OF MAGNESIA) suspension 30 mL  30 mL Oral Daily PRN Sherlon Handing, NP      ? OLANZapine (ZYPREXA) tablet 5 mg  5 mg Oral QHS Sherlon Handing, NP   5 mg at 05/27/21 2123  ? pantoprazole (PROTONIX) EC tablet 40 mg  40 mg Oral BID Waldon Merl F, NP   40 mg at 05/28/21 0820  ? traZODone (DESYREL) tablet 150 mg  150 mg Oral QHS Waldon Merl F, NP   150 mg at 05/27/21 2123  ? vitamin B-12 (CYANOCOBALAMIN) tablet 1,000 mcg  1,000 mcg Oral Daily Waldon Merl F, NP   1,000 mcg at 05/28/21 2409  ? ?PTA Medications: ?Medications Prior to Admission  ?Medication Sig Dispense Refill Last Dose  ? albuterol (PROVENTIL HFA;VENTOLIN HFA) 108 (90 Base) MCG/ACT inhaler Inhale 2 puffs into the lungs every 6 (six)  hours as needed for wheezing or shortness of breath. 1 Inhaler 2   ? BREO ELLIPTA 100-25 MCG/INH AEPB Inhale 1 puff into the lungs daily.     ? cetirizine (ZYRTEC) 10 MG tablet Take 10 mg by mouth daily.     ? clonazePAM (KLONOPIN) 0.5 MG tablet Take 1 tablet (0.5 mg total) by mouth 2 (two) times daily as needed (Anxiety). 60 tablet 2   ? fluvoxaMINE (LUVOX) 100 MG tablet Take 1 tablet (100 mg total) by mouth at bedtime. 90 tablet 1   ? LamoTRIgine 200 MG TB24 24 hour tablet Take 200 mg by mouth daily.      ? lisinopril (PRINIVIL,ZESTRIL) 10 MG tablet Take 10 mg by mouth at bedtime.      ? [START ON 06/13/2021] OLANZapine (ZYPREXA) 5 MG tablet Take 1 tablet (5 mg total) by mouth at bedtime. 90 tablet 0   ? omeprazole (PRILOSEC) 40 MG capsule Take 1 capsule by mouth 2 (two) times daily.     ? traZODone (DESYREL) 150 MG tablet Take 150 mg by mouth at bedtime.     ? vitamin B-12 (CYANOCOBALAMIN) 1000 MCG tablet Take 1,000 mcg by mouth daily.      ? ? ?Patient Stressors: Health problems   ?Medication change or noncompliance   ? ?  Patient Strengths: Capable of independent living  ?Motivation for treatment/growth  ? ?Treatment Modalities: Medication Management, Group therapy, Case management,  ?1 to 1 session with clinician, Psychoeducation, Recreational therapy. ? ? ?Physician Treatment Plan for Primary Diagnosis: Bipolar 1 disorder ([place]) ?Long Term Goal(s):    ? ?Short Term Goals:   ? ?Medication Management: Evaluate patient's response, side effects, and tolerance of medication regimen. ? ?Therapeutic Interventions: 1 to 1 sessions, Unit Group sessions and Medication administration. ? ?Evaluation of Outcomes: Not Met ? ?Physician Treatment Plan for Secondary Diagnosis: Principal Problem: ?  Bipolar 1 disorder ([place]) ? ?Long Term Goal(s):    ? ?Short Term Goals:      ? ?Medication Management: Evaluate patient's response, side effects, and tolerance of medication regimen. ? ?Therapeutic Interventions: 1 to 1 sessions, Unit  Group sessions and Medication administration. ? ?Evaluation of Outcomes: Not Met ? ? ?RN Treatment Plan for Primary Diagnosis: Bipolar 1 disorder ([place]) ?Long Term Goal(s): Knowledge of disease and therapeutic regimen to maintain health will improve ? ?Short Term Goals: Ability to demonstrate self-control, Ability to participate in decision making will improve, Ability to verbalize feelings will improve, Ability to disclose and discuss suicidal ideas, Ability to identify and develop effective coping behaviors will improve, and Compliance with prescribed medications will improve ? ?Medication Management: RN will administer medications as ordered by provider, will assess and evaluate patient's response and provide education to patient for prescribed medication. RN will report any adverse and/or side effects to prescribing provider. ? ?Therapeutic Interventions: 1 on 1 counseling sessions, Psychoeducation, Medication administration, Evaluate responses to treatment, Monitor vital signs and CBGs as ordered, Perform/monitor CIWA, COWS, AIMS and Fall Risk screenings as ordered, Perform wound care treatments as ordered. ? ?Evaluation of Outcomes: Not Met ? ? ?LCSW Treatment Plan for Primary Diagnosis: Bipolar 1 disorder ([place]) ?Long Term Goal(s): Safe transition to appropriate next level of care at discharge, Engage patient in therapeutic group addressing interpersonal concerns. ? ?Short Term Goals: Engage patient in aftercare planning with referrals and resources, Increase social support, Increase ability to appropriately verbalize feelings, Increase emotional regulation, Facilitate acceptance of mental health diagnosis and concerns, and Increase skills for wellness and recovery ? ?Therapeutic Interventions: Assess for all discharge needs, 1 to 1 time with Education officer, museum, Explore available resources and support systems, Assess for adequacy in community support network, Educate family and significant other(s) on suicide  prevention, Complete Psychosocial Assessment, Interpersonal group therapy. ? ?Evaluation of Outcomes: Not Met ? ? ?Progress in Treatment: ?Attending groups: No. ?Participating in groups: No. ?Taking medication as prescribed: Yes. ?Toleration medication: Yes. ?Family/Significant other contact made: No, will contact:  once permission is given ?Patient understands diagnosis: Yes. ?Discussing patient identified problems/goals with staff: Yes. ?Medical problems stabilized or resolved: Yes. ?Denies suicidal/homicidal ideation: Yes. ?Issues/concerns per patient self-inventory: No. ?Other: none ? ?New problem(s) identified: No, Describe:  none ? ?New Short Term/Long Term Goal(s): elimination of symptoms of psychosis, medication management for mood stabilization; elimination of SI thoughts; development of comprehensive mental wellness/sobriety plan.  ? ?Patient Goals:  "I'm having bad problems with my neighbors and they put cameras in my [place]." ? ?Discharge Plan or Barriers: Patient reports plans to return to his home at discharge.  He reports that he has a current mental health provider and plans to continue with them at discharge.  He is not sure who his provider is but indicates that they are seen [facility].  ? ?Reason for Continuation of Hospitalization: Anxiety ?Delusions  ?Depression ?  Hallucinations ?Medication stabilization ?Suicidal ideation ? ?Estimated Length of Stay:  1-7 days ? ? ?Scribe for Treatment Team: ?Rozann Lesches, LCSW ?05/28/2021 ?3:00 PM ?
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
## 2021-05-28 NOTE — Progress Notes (Signed)
Recreation Therapy Notes ? ?INPATIENT RECREATION TR PLAN ? ?Patient Details ?Name: [name] ?MRN: [id number] ?DOB: [birth year] ?Today's Date: 05/28/2021 ? ?Rec Therapy Plan ?Is patient appropriate for Therapeutic Recreation?: Yes ?Treatment times per week: at least 3 ?Estimated Length of Stay: 5-7 days ?TR Treatment/Interventions: Group participation (Comment) ? ?Discharge Criteria ?Pt will be discharged from therapy if:: Discharged ?Treatment plan/goals/alternatives discussed and agreed upon by:: Patient/family ? ?Discharge Summary ?  ? ? ?Janiyah Beery ?05/28/2021, 3:19 PM ?

## 2021-05-28 NOTE — Progress Notes (Signed)
Recreation Therapy Notes ? ?INPATIENT RECREATION THERAPY ASSESSMENT ? ?Patient Details ?Name: [name] ?MRN: [id number] ?DOB: 10/22/1970 ?Today's Date: 05/28/2021 ?      ?Information Obtained From: ?Patient ? ?Able to Participate in Assessment/Interview: ?Yes ? ?Patient Presentation: ?Responsive ? ?Reason for Admission (Per Patient): ?Active Symptoms ? 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 ?Coping Skills:   ?Talk, Other (Comment) (Smoke) ? ?Leisure Interests (2+):  ?Sports - Basketball, [place] (Work on vehicles, Armed forces logistics/support/administrative officer) ? ?Frequency of Recreation/Participation: ?Weekly ? ?Awareness of Community Resources:  ?No ? ?Community Resources:  ?  ? ?Current Use: ?  ? ?If no, Barriers?: ?  ? ?Expressed Interest in [employer] Information: ?No ? ?[place] of Residence:  ?[place] ? ?Patient Main Form of Transportation: ?Car ? ?Patient Strengths:  ?Good to others ? ?Patient Identified Areas of Improvement:  ?Environment ? ?Patient Goal for Hospitalization:  ?Getting my neighbors out of there ? ?Current SI (including self-harm):  ?No ? ?Current HI:  ?No ? ?Current AVH: ?No ? ?Staff Intervention Plan: ?Group Attendance, Collaborate with Interdisciplinary Treatment Team ? ?Consent to Intern Participation: ?N/A ? ?[name] ?05/28/2021, 3:18 PM ?
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
## 2021-05-28 NOTE — Progress Notes (Signed)
Patient was in and out of bed and anxious/irritable with increased concerns about housing  and legal issues. Requested Clonopin x 2.agitated after talking to somebody on the phone. He continues to appear disheveled with poor hygiene. His appetite is good and sleep improving. Emotional support provided and safety precautions reinforced.  ?
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
## 2021-05-28 NOTE — Progress Notes (Signed)
Recreation Therapy Notes ? ? ?Date: 05/28/2021 ? ?Time: 10:45 am ? ?Location: Courtyard  ? ?Behavioral response: N/A ? ?Intervention Topic: Leisure   ? ?Discussion/Intervention: ?Patient refused to attend group. ? ?Clinical Observations/Feedback: ?Patient refused to attend group. ? ?Jenniger Figiel LRT/CTRS ? ? ? ? ? ? ? ?Steffi Noviello ?05/28/2021 12:53 PM ?
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
## 2021-05-29 DIAGNOSIS — F319 Bipolar disorder, unspecified: Secondary | ICD-10-CM | POA: Diagnosis not present

## 2021-05-29 LAB — LIPID PANEL
Cholesterol: 185 mg/dL (ref 0–200)
HDL: 41 mg/dL (ref >40)
LDL Cholesterol: 126 mg/dL — ABNORMAL HIGH (ref 0–99)
Total CHOL/HDL Ratio: 4.5 RATIO
Triglycerides: 91 mg/dL (ref <150)
VLDL: 18 mg/dL (ref 0–40)

## 2021-05-29 NOTE — Group Note (Signed)
[facility] LCSW Group Therapy Note ? ? ?Group Date: 05/29/2021 ?Start Time: 1320 ?End Time: 0321 ? ? ?Type of Therapy and Topic: Group Therapy: Avoiding Self-Sabotaging and Enabling Behaviors ? ?Participation Level: Did Not Attend ? ?Mood: ? ?Description of Group:  ?In this group, patients will learn how to identify obstacles, self-sabotaging and enabling behaviors, as well as: what are they, why do we do them and what needs these behaviors meet. Discuss unhealthy relationships and how to have positive healthy boundaries with those that sabotage and enable. Explore aspects of self-sabotage and enabling in yourself and how to limit these self-destructive behaviors in everyday life. ? ? ?Therapeutic Goals: ?1. Patient will identify one obstacle that relates to self-sabotage and enabling behaviors ?2. Patient will identify one personal self-sabotaging or enabling behavior they did prior to admission ?3. Patient will state a plan to change the above identified behavior ?4. Patient will demonstrate ability to communicate their needs through discussion and/or role play.  ? ? ?Summary of Patient Progress: Patient did not attend group despite encouraged participation. ? ? ? ? ?Therapeutic Modalities:  ?Cognitive Behavioral Therapy ?Person-Centered Therapy ?Motivational Interviewing ? ? ? ?Kenna Gilbert Akhila Mahnken, LCSWA ?
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
## 2021-05-29 NOTE — Progress Notes (Addendum)
Patient alert and oriented x 4, affect is blunted thoughts are organized and coherent, she denies SI/HI/AVH  interacting appropriately with peers and staff. He was complaint with medication regimens and he was offered emotional support for anxiety. 15 minutes safety checks maintained will continue to monitor.   ?

## 2021-05-29 NOTE — Progress Notes (Signed)
D: Pt alert and oriented. Pt reports experiencing some anxiety/depression at this time. Pt denies experiencing any pain at this time. Pt denies experiencing any SI/HI, or AVH at this time.  ? ?A: Scheduled medications administered w/ prn klonopin 0.5 mg po at 0815 per patient request for anxiety. Support and encouragement provided. Routine safety checks conducted q15 minutes.  ? ?R: No adverse drug reactions noted. Patient noted adequate relief from prn klonopin. Patient is agreeable to notifying staff w/ any safety concerns. Pt complaint with medications. Pt interacts appropriately with others on the unit. Pt remains safe at this time. Will continue to monitor.  ?

## 2021-05-30 DIAGNOSIS — F319 Bipolar disorder, unspecified: Secondary | ICD-10-CM | POA: Diagnosis not present

## 2021-05-30 MED ORDER — LAMOTRIGINE 100 MG PO TABS
100.0000 mg | ORAL_TABLET | Freq: Two times a day (BID) | ORAL | Status: DC
  Administered 2021-05-30 – 2021-06-01 (×6): 100 mg via ORAL
  Filled 2021-05-30 (×6): qty 1

## 2021-05-30 NOTE — Plan of Care (Signed)
Assessment PT in medication room. Pt has complaint of increasing anxiety. Pt denies SI / HI / AVH. Pt is provided with PRN medication for anxiety. Pt is appropriate, and calm during assessment. Staff will continue to monitor for saftey.  ? ? ?Problem: Education: ?Goal: Knowledge of [facility] Education information/materials will improve ?Outcome: Not Progressing ?Goal: Emotional status will improve ?Outcome: Not Progressing ?Goal: Mental status will improve ?Outcome: Not Progressing ?  ?Problem: Education: ?Goal: Will be free of psychotic symptoms ?Outcome: Not Progressing ?Goal: Knowledge of the prescribed therapeutic regimen will improve ?Outcome: Not Progressing ?  ?

## 2021-05-30 NOTE — Group Note (Signed)
LCSW Group Therapy Note ? ?Group Date: 05/30/2021 ?Start Time: 1315 ?End Time: 8527 ? ? ?Type of Therapy and Topic:  Group Therapy - Healthy vs Unhealthy Coping Skills ? ?Participation Level:  Did Not Attend  ? ?Description of Group ?The focus of this group was to determine what unhealthy coping techniques typically are used by group members and what healthy coping techniques would be helpful in coping with various problems. Patients were guided in becoming aware of the differences between healthy and unhealthy coping techniques. Patients were asked to identify 2-3 healthy coping skills they would like to learn to use more effectively. ? ?Therapeutic Goals ?Patients learned that coping is what human beings do all day long to deal with various situations in their lives ?Patients defined and discussed healthy vs unhealthy coping techniques ?Patients identified their preferred coping techniques and identified whether these were healthy or unhealthy ?Patients determined 2-3 healthy coping skills they would like to become more familiar with and use more often. ?Patients provided support and ideas to each other ? ? ?Summary of Patient Progress: Due to limited staffing, group was not held on the unit.  ? ?Therapeutic Modalities ?Cognitive Behavioral Therapy ?Motivational Interviewing ? ?Kenna Gilbert Simone Rodenbeck, LCSWA ?05/30/2021  3:14 PM   ?
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
## 2021-05-30 NOTE — Progress Notes (Signed)
Patient alert and oriented x 4, affect is flat but brightens upon approach, his thoughts are organized and coherent, he denies SI/HI/AVH  interacting appropriately with peers and staff. He was complaint with medication regimens and he was offered emotional support for anxiety. 15 minutes safety checks maintained will continue to monitor.   ?

## 2021-05-31 DIAGNOSIS — F319 Bipolar disorder, unspecified: Secondary | ICD-10-CM | POA: Diagnosis not present

## 2021-05-31 LAB — HEMOGLOBIN A1C
Hgb A1c MFr Bld: 5.7 % — ABNORMAL HIGH (ref 4.8–5.6)
Mean Plasma Glucose: 117 mg/dL

## 2021-05-31 NOTE — Progress Notes (Signed)
?   05/31/21 1100  ?Psych Admission Type (Psych Patients Only)  ?Admission Status Involuntary  ?Psychosocial Assessment  ?Patient Complaints Anxiety  ?Eye Contact Fair  ?Facial Expression Flat  ?Affect Anxious  ?Speech Logical/coherent  ?Interaction Assertive  ?Motor Activity Restless  ?Appearance/Hygiene Improved  ?Behavior Characteristics Cooperative  ?Mood Pleasant  ?Thought Process  ?Coherency WDL  ?Content WDL  ?Delusions None reported or observed  ?Perception WDL  ?Hallucination None reported or observed  ?Judgment WDL  ?Confusion WDL  ?Danger to Self  ?Current suicidal ideation? Denies  ?Danger to Others  ?Danger to Others None reported or observed  ? ?Patient compliant with medication c/o anxiety prn Clonopin given and effective. Denies SI/HI/A/VH and verbally contracted for safety.  ? ?Support and encouragement provided. ?

## 2021-05-31 NOTE — Progress Notes (Signed)
Recreation Therapy Notes ? ?Date: 05/31/2021 ? ?Time: 10:50 am ? ?Location: Craft room  ? ?Behavioral response: N/A ? ?Intervention Topic: Self-care  ? ?Discussion/Intervention: ?Patient refused to attend group. ? ?Clinical Observations/Feedback: ?Patient refused to attend group. ? ?[name] LRT/CTRS ? ? ? ? ? ? ? ?Erice Ahles ?05/31/2021 12:03 PM ?
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
## 2021-05-31 NOTE — Group Note (Signed)
[place] LCSW Group Therapy Note ? ? ? ?Group Date: 05/31/2021 ?Start Time: 1300 ?End Time: 1400 ? ?Type of Therapy and Topic:  Group Therapy:  Overcoming Obstacles ? ?Participation Level:  [facility] PARTICIPATION LEVEL: Did Not Attend ? ?Mood: ? ?Description of Group:   ?In this group patients will be encouraged to explore what they see as obstacles to their own wellness and recovery. They will be guided to discuss their thoughts, feelings, and behaviors related to these obstacles. The group will process together ways to cope with barriers, with attention given to specific choices patients can make. Each patient will be challenged to identify changes they are motivated to make in order to overcome their obstacles. This group will be process-oriented, with patients participating in exploration of their own experiences as well as giving and receiving support and challenge from other group members. ? ?Therapeutic Goals: ?1. Patient will identify personal and current obstacles as they relate to admission. ?2. Patient will identify barriers that currently interfere with their wellness or overcoming obstacles.  ?3. Patient will identify feelings, thought process and behaviors related to these barriers. ?4. Patient will identify two changes they are willing to make to overcome these obstacles:  ? ? ?Summary of Patient Progress ? ? ?X ? ? ?Therapeutic Modalities:   ?Cognitive Behavioral Therapy ?Solution Focused Therapy ?Motivational Interviewing ?Relapse Prevention Therapy ? ? ?Rozann Lesches, LCSW ?

## 2021-06-01 DIAGNOSIS — F319 Bipolar disorder, unspecified: Secondary | ICD-10-CM | POA: Diagnosis not present

## 2021-06-01 MED ORDER — CLONAZEPAM 0.5 MG PO TABS: 0.5000 mg | ORAL_TABLET | Freq: Two times a day (BID) | ORAL | 1 refills | Status: DC | PRN

## 2021-06-01 MED ORDER — DIVALPROEX SODIUM 250 MG PO DR TAB: 250.0000 mg | DELAYED_RELEASE_TABLET | Freq: Two times a day (BID) | ORAL | 1 refills | Status: DC

## 2021-06-01 MED ORDER — FLUTICASONE FUROATE-VILANTEROL 100-25 MCG/ACT IN AEPB: 1.0000 | INHALATION_SPRAY | Freq: Every day | RESPIRATORY_TRACT | 1 refills | Status: AC

## 2021-06-01 MED ORDER — VITAMIN B-12 1000 MCG PO TABS: 1000.0000 ug | ORAL_TABLET | Freq: Every day | ORAL | 1 refills | Status: AC

## 2021-06-01 MED ORDER — FLUVOXAMINE MALEATE 100 MG PO TABS: 100.0000 mg | ORAL_TABLET | Freq: Every day | ORAL | 1 refills | Status: DC

## 2021-06-01 MED ORDER — PANTOPRAZOLE SODIUM 40 MG PO TBEC: 40.0000 mg | DELAYED_RELEASE_TABLET | Freq: Two times a day (BID) | ORAL | 1 refills | Status: AC

## 2021-06-01 MED ORDER — TRAZODONE HCL 150 MG PO TABS: 150.0000 mg | ORAL_TABLET | Freq: Every day | ORAL | 1 refills | Status: DC

## 2021-06-01 MED ORDER — OLANZAPINE 10 MG PO TABS: 10.0000 mg | ORAL_TABLET | Freq: Every day | ORAL | 1 refills | Status: DC

## 2021-06-01 MED ORDER — LAMOTRIGINE 100 MG PO TABS
100.0000 mg | ORAL_TABLET | Freq: Two times a day (BID) | ORAL | 1 refills | Status: DC
Start: 2021-06-01 — End: 2021-07-19
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
## 2021-06-01 NOTE — Progress Notes (Signed)
Recreation Therapy Notes ? ? ?Date: 06/01/2021 ? ?Time: 10:05 am ? ?Location: Craft room  ? ?Behavioral response: N/A ? ?Intervention Topic: Problem Solving   ? ?Discussion/Intervention: ?Patient refused to attend group. ? ?Clinical Observations/Feedback: ?Patient refused to attend group. ? ?Anayah Arvanitis LRT/CTRS ? ? ? ? ? ? ? ?Evren Shankland ?06/01/2021 11:52 AM ?

## 2021-06-01 NOTE — Progress Notes (Signed)
Patient pleasant and cooperative on approach. Denies SI,HI and AVH. Verbalized understanding discharge instructions,follow up care and prescriptions. All belongings returned from [employer]. Patient escorted out by staff & transported by family. ?

## 2021-06-01 NOTE — Group Note (Signed)
LCSW Group Therapy Note ? ? ?Group Date: 06/01/2021 ?Start Time: 1300 ?End Time: 1400 ? ? ?Type of Therapy and Topic:  Group Therapy: Boundaries ? ?Participation Level:  Did Not Attend ? ?Description of Group: ?This group will address the use of boundaries in their personal lives. Patients will explore why boundaries are important, the difference between healthy and unhealthy boundaries, and negative and postive outcomes of different boundaries and will look at how boundaries can be crossed.  Patients will be encouraged to identify current boundaries in their own lives and identify what kind of boundary is being set. Facilitators will guide patients in utilizing problem-solving interventions to address and correct types boundaries being used and to address when no boundary is being used. Understanding and applying boundaries will be explored and addressed for obtaining and maintaining a balanced life. Patients will be encouraged to explore ways to assertively make their boundaries and needs known to significant others in their lives, using other group members and facilitator for role play, support, and feedback. ? ?Therapeutic Goals: ? ?1.  Patient will identify areas in their life where setting clear boundaries could be  used to improve their life.  ?2.  Patient will identify signs/triggers that a boundary is not being respected. ?3.  Patient will identify two ways to set boundaries in order to achieve balance in  their lives: ?4.  Patient will demonstrate ability to communicate their needs and set boundaries  through discussion and/or role plays ? ?Summary of Patient Progress:  Patient did not attend.  ? ?Therapeutic Modalities:   ?Cognitive Behavioral Therapy ?Solution-Focused Therapy ? ?Karalee Height, LCSWA ?06/01/2021  2:12 PM   ? ?

## 2021-06-01 NOTE — Progress Notes (Signed)
?  [facility] Adult Case Management Discharge Plan : ? ?Will you be returning to the same living situation after discharge:  Yes,  pt reports that he is returning home ?At discharge, do you have transportation home?: Yes,  family will provide transportation.  ?Do you have the ability to pay for your medications: Yes,  [employer] Medicare HMO ? ?Release of information consent forms completed and in the chart;  Patient's signature needed at discharge. ? ?Patient to Follow up at: ? Follow-up Information   ? ? [facility] Follow up.   ?Specialty: Behavioral Health ?Why: Appointment is scheduled for 06/09/2021 at 10:45AM, virtual appointment.  If you can not do a virtual appointment then please call the office PRIOR to the appointment. ?Contact information: ?[address] ?[place] ?[place] ?[phone] ? ?  ?  ? ?  ?  ? ?  ? ? ?Next level of care provider has access to [facility] ? ?Safety Planning and Suicide Prevention discussed: Yes,  SPE completed with the patient's mother.  ? ?  ? ?Has patient been referred to the Quitline?: Patient refused referral ? ?Patient has been referred for addiction treatment: Pt. refused referral  Pt declined referral for SA treatment despite positive UDS results.  ? ?Rozann Lesches, LCSW ?06/01/2021, 2:49 PM ?

## 2021-06-01 NOTE — Progress Notes (Signed)
patient calm and cooperative. Alert and oriented x3. Denies SI and HI. Compliant with treatment. Will continue to monitor. ? 06/01/21 0500  ?Psych Admission Type (Psych Patients Only)  ?Admission Status Involuntary  ?Psychosocial Assessment  ?Patient Complaints Anxiety  ?Eye Contact Fair  ?Facial Expression Flat  ?Affect Anxious  ?Speech Logical/coherent  ?Interaction Assertive  ?Motor Activity Restless  ?Appearance/Hygiene Improved  ?Behavior Characteristics Cooperative  ?Mood Pleasant  ?Thought Process  ?Coherency WDL  ?Content WDL  ?Delusions None reported or observed  ?Perception WDL  ?Hallucination None reported or observed  ?Judgment WDL  ?Confusion WDL  ?Danger to Self  ?Current suicidal ideation? Denies  ?Danger to Others  ?Danger to Others None reported or observed  ? ? ?

## 2021-06-01 NOTE — BHH Suicide Risk Assessment (Signed)
[facility] Discharge Suicide Risk Assessment ? ? ?Principal Problem: Bipolar 1 disorder ([name]) ?Discharge Diagnoses: Principal Problem: ?  Bipolar 1 disorder ([name]) ?Active Problems: ?  Hx of traumatic brain injury ?  Cannabis abuse ?  Amphetamine abuse ([place]) ? ? ?Total Time spent with patient: 30 minutes ? ?Musculoskeletal: ?Strength & Muscle Tone: within normal limits ?Gait & Station: normal ?Patient leans: N/A ? ?Psychiatric Specialty Exam ? ?Presentation  ?General Appearance: Disheveled ? ?Eye Contact:Fair ? ?Speech:Clear and Coherent ? ?Speech Volume:Normal ? ?Handedness:No data recorded ? ?Mood and Affect  ?Mood:Dysphoric ? ?Duration of Depression Symptoms: Greater than two weeks ? ?Affect:Blunt; Congruent ? ? ?Thought Process  ?Thought Processes:Coherent ? ?Descriptions of Associations:Loose ? ?Orientation:Full (Time, Place and Person) ? ?Thought Content:Illogical; Delusions ? ?History of Schizophrenia/Schizoaffective disorder:No ? ?Duration of Psychotic Symptoms:N/A ? ?Hallucinations:No data recorded ?Ideas of Reference:Delusions ? ?Suicidal Thoughts:No data recorded ?Homicidal Thoughts:No data recorded ? ?Sensorium  ?Memory:Immediate Poor ? ?Judgment:No data recorded ?Insight:Poor ? ? ?Executive Functions  ?Concentration:Fair ? ?Attention Span:Fair ? ?Recall:Poor ? ?Fund of Knowledge:Poor ? ?Language:Poor ? ? ?Psychomotor Activity  ?Psychomotor Activity:No data recorded ? ?Assets  ?Assets:Physical Health; Resilience; Social Support ? ? ?Sleep  ?Sleep:No data recorded ? ?Physical Exam: ?Physical Exam ?Vitals and nursing note reviewed.  ?Constitutional:   ?   Appearance: Normal appearance.  ?HENT:  ?   Head: Normocephalic and atraumatic.  ?   Mouth/Throat:  ?   Pharynx: Oropharynx is clear.  ?Eyes:  ?   Pupils: Pupils are equal, round, and reactive to light.  ?Cardiovascular:  ?   Rate and Rhythm: Normal rate and regular rhythm.  ?Pulmonary:  ?   Effort: Pulmonary effort is normal.  ?   Breath sounds: Normal breath  sounds.  ?Abdominal:  ?   General: Abdomen is flat.  ?   Palpations: Abdomen is soft.  ?Musculoskeletal:     ?   General: Normal range of motion.  ?Skin: ?   General: Skin is warm and dry.  ?Neurological:  ?   General: No focal deficit present.  ?   Mental Status: He is alert. Mental status is at baseline.  ?Psychiatric:     ?   Attention and Perception: Attention normal.     ?   Mood and Affect: Mood normal.     ?   Speech: Speech normal.     ?   Behavior: Behavior is cooperative.     ?   Thought Content: Thought content is delusional. Thought content is not paranoid. Thought content does not include homicidal or suicidal ideation.     ?   Cognition and Memory: Cognition is impaired.     ?   Judgment: Judgment normal.  ? ?Review of Systems  ?Constitutional: Negative.   ?HENT: Negative.    ?Eyes: Negative.   ?Respiratory: Negative.    ?Cardiovascular: Negative.   ?Gastrointestinal: Negative.   ?Musculoskeletal: Negative.   ?Skin: Negative.   ?Neurological: Negative.   ?Psychiatric/Behavioral:  Negative for depression, hallucinations, memory loss, substance abuse and suicidal ideas. The patient is not nervous/anxious and does not have insomnia.   ?Blood pressure (!) 112/100, pulse 68, temperature 97.7 ?F (36.5 ?C), temperature source Oral, resp. rate 18, height '6\' 1"'$  (1.854 m), weight 89.8 kg, SpO2 98 %. Body mass index is 26.12 kg/m?. ? ?Mental Status Per Nursing Assessment::   ?On Admission:  NA ? ?Demographic Factors:  ?Male and Caucasian ? ?Loss Factors: ?Financial problems/change in socioeconomic status ? ?Historical Factors: ?Impulsivity ? ?Risk  Reduction Factors:   ?Positive social support and Positive therapeutic relationship ? ?Continued Clinical Symptoms:  ?Bipolar Disorder:   Mixed State ?Alcohol/Substance Abuse/Dependencies ?Medical Diagnoses and Treatments/Surgeries ? ?Cognitive Features That Contribute To Risk:  ?None   ? ?Suicide Risk:  ?Minimal: No identifiable suicidal ideation.  Patients presenting  with no risk factors but with morbid ruminations; may be classified as minimal risk based on the severity of the depressive symptoms ? ? ? ?Plan Of Care/Follow-up recommendations:  ?Patient currently denying any suicidal ideation and presents himself as calm with appropriate relaxed affect.  Denies any homicidal ideation or thought of violence.  Agreeable to continuing medication.  Has outpatient treatment in place for follow-up. ? ?Alethia Berthold, MD ?06/01/2021, 12:49 PM ?
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
## 2021-06-09 ENCOUNTER — Telehealth (INDEPENDENT_AMBULATORY_CARE_PROVIDER_SITE_OTHER): Payer: Medicare HMO | Admitting: Psychiatry

## 2021-06-09 ENCOUNTER — Other Ambulatory Visit: Payer: Self-pay

## 2021-06-09 ENCOUNTER — Encounter: Payer: Self-pay | Admitting: Psychiatry

## 2021-06-09 DIAGNOSIS — G4700 Insomnia, unspecified: Secondary | ICD-10-CM

## 2021-06-09 DIAGNOSIS — F1990 Other psychoactive substance use, unspecified, uncomplicated: Secondary | ICD-10-CM | POA: Diagnosis not present

## 2021-06-09 DIAGNOSIS — F3170 Bipolar disorder, currently in remission, most recent episode unspecified: Secondary | ICD-10-CM

## 2021-06-10 DIAGNOSIS — F3170 Bipolar disorder, currently in remission, most recent episode unspecified: Secondary | ICD-10-CM | POA: Diagnosis not present

## 2021-06-11 ENCOUNTER — Encounter: Payer: Self-pay | Admitting: Psychiatry

## 2021-06-11 DIAGNOSIS — R945 Abnormal results of liver function studies: Secondary | ICD-10-CM

## 2021-06-11 LAB — HEPATIC FUNCTION PANEL
ALT: 46 IU/L — ABNORMAL HIGH (ref 0–44)
AST: 87 IU/L — ABNORMAL HIGH (ref 0–40)
Albumin: 4.6 g/dL (ref 4.0–5.0)
Alkaline Phosphatase: 103 IU/L (ref 44–121)
Bilirubin Total: <0.2 mg/dL (ref 0.0–1.2)
Bilirubin, Direct: <0.1 mg/dL (ref 0.00–0.40)
Total Protein: 7.1 g/dL (ref 6.0–8.5)

## 2021-06-11 LAB — CBC
Hematocrit: 37.6 % (ref 37.5–51.0)
Hemoglobin: 12.3 g/dL — ABNORMAL LOW (ref 13.0–17.7)
MCH: 27.5 pg (ref 26.6–33.0)
MCHC: 32.7 g/dL (ref 31.5–35.7)
MCV: 84 fL (ref 79–97)
Platelets: 279 10*3/uL (ref 150–450)
RBC: 4.48 x10E6/uL (ref 4.14–5.80)
RDW: 13.2 % (ref 11.6–15.4)
WBC: 8.1 10*3/uL (ref 3.4–10.8)

## 2021-06-11 LAB — VALPROIC ACID LEVEL: Valproic Acid Lvl: 5 ug/mL — ABNORMAL LOW (ref 50–100)

## 2021-06-11 NOTE — Progress Notes (Signed)
Discussed with the patient. There is an elevation in LFT, which could be secondary to depakote. He agreed to hold this medication. He agreed to take blood test in a month again to monitor this.

## 2021-06-17 DIAGNOSIS — F17210 Nicotine dependence, cigarettes, uncomplicated: Secondary | ICD-10-CM | POA: Diagnosis not present

## 2021-06-17 DIAGNOSIS — J310 Chronic rhinitis: Secondary | ICD-10-CM | POA: Diagnosis not present

## 2021-06-17 DIAGNOSIS — R918 Other nonspecific abnormal finding of lung field: Secondary | ICD-10-CM | POA: Diagnosis not present

## 2021-06-17 DIAGNOSIS — R0609 Other forms of dyspnea: Secondary | ICD-10-CM | POA: Diagnosis not present

## 2021-06-17 DIAGNOSIS — J449 Chronic obstructive pulmonary disease, unspecified: Secondary | ICD-10-CM | POA: Diagnosis not present

## 2021-07-12 DIAGNOSIS — F068 Other specified mental disorders due to known physiological condition: Secondary | ICD-10-CM | POA: Diagnosis not present

## 2021-07-12 DIAGNOSIS — Z8659 Personal history of other mental and behavioral disorders: Secondary | ICD-10-CM | POA: Diagnosis not present

## 2021-07-12 DIAGNOSIS — R569 Unspecified convulsions: Secondary | ICD-10-CM | POA: Diagnosis not present

## 2021-07-12 DIAGNOSIS — S069X0S Unspecified intracranial injury without loss of consciousness, sequela: Secondary | ICD-10-CM | POA: Diagnosis not present
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
## 2021-07-19 ENCOUNTER — Telehealth: Payer: Self-pay

## 2021-07-19 ENCOUNTER — Ambulatory Visit (INDEPENDENT_AMBULATORY_CARE_PROVIDER_SITE_OTHER): Payer: Medicare HMO | Admitting: Psychiatry

## 2021-07-19 ENCOUNTER — Encounter: Payer: Self-pay | Admitting: Psychiatry

## 2021-07-19 VITALS — BP 112/65 | HR 89 | Temp 98.3°F | Wt 211.4 lb

## 2021-07-19 DIAGNOSIS — F3132 Bipolar disorder, current episode depressed, moderate: Secondary | ICD-10-CM

## 2021-07-19 DIAGNOSIS — F1994 Other psychoactive substance use, unspecified with psychoactive substance-induced mood disorder: Secondary | ICD-10-CM

## 2021-07-19 MED ORDER — TRAZODONE HCL 150 MG PO TABS: 150.0000 mg | ORAL_TABLET | Freq: Every day | ORAL | 5 refills | Status: DC

## 2021-07-19 MED ORDER — OLANZAPINE 10 MG PO TABS: 15.0000 mg | ORAL_TABLET | Freq: Every day | ORAL | 1 refills | Status: DC

## 2021-07-19 MED ORDER — FLUVOXAMINE MALEATE 100 MG PO TABS: 100.0000 mg | ORAL_TABLET | Freq: Every day | ORAL | 5 refills | Status: DC

## 2021-07-19 MED ORDER — CLONAZEPAM 0.5 MG PO TABS: 0.5000 mg | ORAL_TABLET | Freq: Two times a day (BID) | ORAL | 2 refills | Status: DC | PRN

## 2021-07-19 MED ORDER — DIVALPROEX SODIUM 250 MG PO DR TAB: 250.0000 mg | DELAYED_RELEASE_TABLET | Freq: Two times a day (BID) | ORAL | 1 refills | Status: DC

## 2021-07-19 MED ORDER — LAMOTRIGINE 100 MG PO TABS: 100.0000 mg | ORAL_TABLET | Freq: Two times a day (BID) | ORAL | 1 refills | Status: DC

## 2021-07-19 NOTE — Telephone Encounter (Signed)
Discussed with the pharmacist. They wanted to check in about this writer's calling of cancelling lamotrigine as they have never received the order. (It was ordered earlier in Epic).  The pharmacist verbalized understanding to cancel the older if it shows up in the system in the future.

## 2021-07-19 NOTE — Telephone Encounter (Signed)
pharmacy needs to speak with the provider because they have questions about medications. they have some for him to pick up and then they received a message today so they need to speak with provider ?

## 2021-08-06 DIAGNOSIS — R569 Unspecified convulsions: Secondary | ICD-10-CM | POA: Diagnosis not present

## 2021-08-06 DIAGNOSIS — E782 Mixed hyperlipidemia: Secondary | ICD-10-CM | POA: Diagnosis not present

## 2021-08-06 DIAGNOSIS — I10 Essential (primary) hypertension: Secondary | ICD-10-CM | POA: Diagnosis not present

## 2021-08-06 DIAGNOSIS — R7303 Prediabetes: Secondary | ICD-10-CM | POA: Diagnosis not present

## 2021-08-17 DIAGNOSIS — Z862 Personal history of diseases of the blood and blood-forming organs and certain disorders involving the immune mechanism: Secondary | ICD-10-CM | POA: Diagnosis not present

## 2021-08-17 DIAGNOSIS — I10 Essential (primary) hypertension: Secondary | ICD-10-CM | POA: Diagnosis not present

## 2021-08-17 DIAGNOSIS — R7303 Prediabetes: Secondary | ICD-10-CM | POA: Diagnosis not present

## 2021-08-17 DIAGNOSIS — F17200 Nicotine dependence, unspecified, uncomplicated: Secondary | ICD-10-CM | POA: Diagnosis not present
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
## 2021-09-06 ENCOUNTER — Telehealth (INDEPENDENT_AMBULATORY_CARE_PROVIDER_SITE_OTHER): Payer: Medicare HMO | Admitting: Psychiatry

## 2021-09-06 ENCOUNTER — Encounter: Payer: Self-pay | Admitting: Psychiatry

## 2021-09-06 DIAGNOSIS — F3132 Bipolar disorder, current episode depressed, moderate: Secondary | ICD-10-CM

## 2021-09-06 DIAGNOSIS — F1994 Other psychoactive substance use, unspecified with psychoactive substance-induced mood disorder: Secondary | ICD-10-CM

## 2021-09-06 MED ORDER — DIVALPROEX SODIUM 250 MG PO DR TAB: 250.0000 mg | DELAYED_RELEASE_TABLET | Freq: Two times a day (BID) | ORAL | 2 refills | Status: DC

## 2021-09-06 MED ORDER — OLANZAPINE 10 MG PO TABS: 15.0000 mg | ORAL_TABLET | Freq: Every day | ORAL | 0 refills | Status: DC
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
## 2021-10-18 ENCOUNTER — Ambulatory Visit (INDEPENDENT_AMBULATORY_CARE_PROVIDER_SITE_OTHER): Payer: Medicare HMO | Admitting: Psychiatry

## 2021-10-18 ENCOUNTER — Encounter: Payer: Self-pay | Admitting: Psychiatry

## 2021-10-18 ENCOUNTER — Other Ambulatory Visit
Admission: RE | Admit: 2021-10-18 | Discharge: 2021-10-18 | Disposition: A | Discharge status: Home / Self Care | Payer: Medicare HMO | Attending: Psychiatry | Admitting: Psychiatry

## 2021-10-18 VITALS — BP 129/80 | HR 87 | Temp 97.1°F | Wt 211.2 lb

## 2021-10-18 DIAGNOSIS — F1994 Other psychoactive substance use, unspecified with psychoactive substance-induced mood disorder: Secondary | ICD-10-CM

## 2021-10-18 DIAGNOSIS — Z79899 Other long term (current) drug therapy: Secondary | ICD-10-CM | POA: Insufficient documentation

## 2021-10-18 DIAGNOSIS — F3132 Bipolar disorder, current episode depressed, moderate: Secondary | ICD-10-CM

## 2021-10-18 DIAGNOSIS — R945 Abnormal results of liver function studies: Secondary | ICD-10-CM | POA: Diagnosis not present

## 2021-10-18 DIAGNOSIS — F250 Schizoaffective disorder, bipolar type: Secondary | ICD-10-CM

## 2021-10-18 LAB — URINE DRUG SCREEN, QUALITATIVE (ARMC ONLY)
Amphetamines, Ur Screen: POSITIVE — AB
Barbiturates, Ur Screen: NOT DETECTED
Benzodiazepine, Ur Scrn: NOT DETECTED
Cannabinoid 50 Ng, Ur ~~LOC~~: POSITIVE — AB
Cocaine Metabolite,Ur ~~LOC~~: NOT DETECTED
MDMA (Ecstasy)Ur Screen: NOT DETECTED
Methadone Scn, Ur: NOT DETECTED
Opiate, Ur Screen: NOT DETECTED
Phencyclidine (PCP) Ur S: NOT DETECTED
Tricyclic, Ur Screen: NOT DETECTED

## 2021-10-18 LAB — VALPROIC ACID LEVEL: Valproic Acid Lvl: <10 ug/mL — ABNORMAL LOW (ref 50.0–100.0)

## 2021-10-18 LAB — HEPATIC FUNCTION PANEL
ALT: 23 U/L (ref 0–44)
AST: 20 U/L (ref 15–41)
Albumin: 4.7 g/dL (ref 3.5–5.0)
Alkaline Phosphatase: 110 U/L (ref 38–126)
Bilirubin, Direct: <0.1 mg/dL (ref 0.0–0.2)
Total Bilirubin: 0.5 mg/dL (ref 0.3–1.2)
Total Protein: 8.6 g/dL — ABNORMAL HIGH (ref 6.5–8.1)

## 2021-10-18 MED ORDER — OLANZAPINE 15 MG PO TABS
15.0000 mg | ORAL_TABLET | Freq: Every day | ORAL | 0 refills | Status: DC
Start: 2021-10-26 — End: 2021-11-18

## 2021-10-18 MED ORDER — CLONAZEPAM 0.5 MG PO TABS: 0.5000 mg | ORAL_TABLET | Freq: Two times a day (BID) | ORAL | 2 refills | Status: DC | PRN

## 2021-10-18 NOTE — Addendum Note (Signed)
Addended by: Antonieta Iba C on: 10/18/2021 11:38 AM   Modules accepted: Orders

## 2021-10-18 NOTE — Progress Notes (Signed)
Discussed the result with her mother. Will plan to continue the current dose of depakote at this time.

## 2021-11-08 ENCOUNTER — Telehealth: Payer: Self-pay
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
## 2021-11-18 ENCOUNTER — Other Ambulatory Visit
Admission: RE | Admit: 2021-11-18 | Discharge: 2021-11-18 | Disposition: A | Discharge status: Home / Self Care | Payer: Medicare HMO | Attending: Psychiatry | Admitting: Psychiatry

## 2021-11-18 ENCOUNTER — Ambulatory Visit: Payer: Medicare HMO | Admitting: Psychiatry

## 2021-11-18 ENCOUNTER — Encounter: Payer: Self-pay | Admitting: Psychiatry

## 2021-11-18 VITALS — BP 158/89 | HR 73 | Temp 98.1°F | Wt 214.2 lb

## 2021-11-18 DIAGNOSIS — F1990 Other psychoactive substance use, unspecified, uncomplicated: Secondary | ICD-10-CM | POA: Insufficient documentation

## 2021-11-18 DIAGNOSIS — G4700 Insomnia, unspecified: Secondary | ICD-10-CM | POA: Diagnosis not present

## 2021-11-18 DIAGNOSIS — F250 Schizoaffective disorder, bipolar type: Secondary | ICD-10-CM

## 2021-11-18 DIAGNOSIS — F1594 Other stimulant use, unspecified with stimulant-induced mood disorder: Secondary | ICD-10-CM | POA: Insufficient documentation

## 2021-11-18 MED ORDER — OLANZAPINE 15 MG PO TABS
15.0000 mg | ORAL_TABLET | Freq: Every day | ORAL | 1 refills | Status: DC
Start: 2021-11-26 — End: 2022-01-04

## 2021-11-26 LAB — DRUG SCREEN (9) + CREATININE, UR

## 2021-12-07 ENCOUNTER — Telehealth: Payer: Self-pay

## 2021-12-07 NOTE — Telephone Encounter (Signed)
I do not prescribe that medication as it is indicated for seizure. Please ask them to ask other provider for refills.

## 2021-12-07 NOTE — Telephone Encounter (Signed)
  received fax request for a refill on the lamotrigine '100mg'$    lamoTRIgine (LAMICTAL) 100 MG tablet Medication Date: 07/19/2021 Department: [facility] Documenting: Norman Clay, MD Authorizing: [provider]   Order Providers  Prescribing Provider Encounter Provider  [provider] Norman Clay, MD   Outpatient Medication Detail   Disp Refills Start End   lamoTRIgine (LAMICTAL) 100 MG tablet       Sig - Route: Take 100 mg by mouth 2 (two) times daily. - Oral   Class: Historical Med

## 2021-12-08 NOTE — Telephone Encounter (Signed)
pharmacy faxed and confirmed notification

## 2021-12-09 ENCOUNTER — Telehealth: Payer: Self-pay

## 2021-12-09 NOTE — Telephone Encounter (Signed)
pt states he did labwork yesterday and wanted to know if you gotten results yet. that he went when he left office.

## 2021-12-09 NOTE — Telephone Encounter (Signed)
We have not received any result yet. Did he get urine test? It might take a few days for results to be back, although I cannot see in the chart that he did it except the one in September, which was labeled as "not performed."

## 2021-12-09 NOTE — Telephone Encounter (Signed)
-----   Message from Ursula Alert, MD sent at 11/30/2021  3:24 PM EDT -----   Please contact patient as well as lab  to determine why the UDS was not done. Lab results - says - not performed. Please let me know if another order needs to be placed.   ----- Message ----- From: Interface, Lab In [place] Sent: 11/26/2021   1:36 PM EDT To: Norman Clay, MD

## 2021-12-23 DIAGNOSIS — J4489 Other specified chronic obstructive pulmonary disease: Secondary | ICD-10-CM | POA: Diagnosis not present

## 2021-12-23 DIAGNOSIS — Z87891 Personal history of nicotine dependence: Secondary | ICD-10-CM | POA: Diagnosis not present

## 2021-12-23 DIAGNOSIS — J301 Allergic rhinitis due to pollen: Secondary | ICD-10-CM | POA: Diagnosis not present

## 2021-12-24 NOTE — Telephone Encounter (Signed)
pt states that he did give a urine test his mother even stated that he did a urine test also.

## 2021-12-24 NOTE — Telephone Encounter (Signed)
Could you contact the [facility] he went to see if they have the sample or test being done? I have not received any result.

## 2021-12-25 NOTE — Telephone Encounter (Signed)
error 
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
## 2022-01-04 ENCOUNTER — Other Ambulatory Visit
Admission: RE | Admit: 2022-01-04 | Discharge: 2022-01-04 | Disposition: A | Discharge status: Home / Self Care | Payer: Medicare HMO | Source: Ambulatory Visit | Attending: Psychiatry | Admitting: Psychiatry

## 2022-01-04 ENCOUNTER — Encounter: Payer: Self-pay | Admitting: Psychiatry

## 2022-01-04 ENCOUNTER — Ambulatory Visit: Payer: Medicare HMO | Admitting: Psychiatry

## 2022-01-04 VITALS — BP 145/90 | HR 75 | Temp 98.1°F | Ht 73.0 in | Wt 208.8 lb

## 2022-01-04 DIAGNOSIS — F1990 Other psychoactive substance use, unspecified, uncomplicated: Secondary | ICD-10-CM | POA: Insufficient documentation

## 2022-01-04 DIAGNOSIS — F1290 Cannabis use, unspecified, uncomplicated: Secondary | ICD-10-CM | POA: Diagnosis not present

## 2022-01-04 DIAGNOSIS — F419 Anxiety disorder, unspecified: Secondary | ICD-10-CM

## 2022-01-04 DIAGNOSIS — F250 Schizoaffective disorder, bipolar type: Secondary | ICD-10-CM

## 2022-01-04 DIAGNOSIS — F1994 Other psychoactive substance use, unspecified with psychoactive substance-induced mood disorder: Secondary | ICD-10-CM | POA: Insufficient documentation

## 2022-01-04 MED ORDER — CLONAZEPAM 0.5 MG PO TABS: 0.5000 mg | ORAL_TABLET | Freq: Two times a day (BID) | ORAL | 1 refills | Status: DC | PRN

## 2022-01-04 MED ORDER — TRAZODONE HCL 150 MG PO TABS
150.0000 mg | ORAL_TABLET | Freq: Every day | ORAL | 5 refills | Status: DC
Start: 2022-01-29 — End: 2022-10-19

## 2022-01-04 MED ORDER — DIVALPROEX SODIUM 250 MG PO DR TAB
250.0000 mg | DELAYED_RELEASE_TABLET | Freq: Two times a day (BID) | ORAL | 3 refills | Status: DC
Start: 2022-01-04 — End: 2022-09-22

## 2022-01-04 MED ORDER — OLANZAPINE 15 MG PO TABS
15.0000 mg | ORAL_TABLET | Freq: Every day | ORAL | 2 refills | Status: DC
Start: 2022-01-26 — End: 2022-06-02

## 2022-01-04 NOTE — Patient Instructions (Signed)
Continue fluvoxamine 100 mg at night Continue olanzapine 15 mg at night   Continue valproic acid 250 mg twice a day  Continue clonazepam 0.5 mg twice a day Continue Trazodone 150 mg at night as needed for sleep Obtain labs (UDS confirmatory) Next appointment: 12/19 at 11 AM

## 2022-01-07 ENCOUNTER — Other Ambulatory Visit: Payer: Self-pay | Admitting: *Deleted

## 2022-01-07 DIAGNOSIS — F17210 Nicotine dependence, cigarettes, uncomplicated: Secondary | ICD-10-CM

## 2022-01-07 DIAGNOSIS — Z87891 Personal history of nicotine dependence: Secondary | ICD-10-CM

## 2022-01-07 DIAGNOSIS — Z122 Encounter for screening for malignant neoplasm of respiratory organs: Secondary | ICD-10-CM

## 2022-01-08 LAB — DRUG SCREEN (9) + CREATININE, UR
BENZODIAZ UR QL: NEGATIVE ng/mL
Barbiturate, Ur: NEGATIVE ng/mL
Cocaine (Metab.), Urine: NEGATIVE ng/mL
Creatinine, Urine: 29.3 mg/dL (ref 20.0–300.0)
Methadone Screen, Urine: NEGATIVE ng/mL
OPIATE SCREEN URINE: NEGATIVE ng/mL
Oxycodone+Oxymorphone Ur Ql Scn: NEGATIVE ng/mL
PCP, Urine: NEGATIVE ng/mL
Propoxyphene, Urine: NEGATIVE ng/mL
pH, Urine: 6.2 (ref 4.5–8.9)

## 2022-01-08 LAB — AMPHETAMINES (GC/MS), URINE
Amphetamine (GC/MS): 535 ng/mL
Amphetamine: POSITIVE — AB
Amphetamines: POSITIVE — AB
Methamphetamine (GC/MS): >3000 ng/mL
Methamphetamine, urine: POSITIVE — AB

## 2022-01-11 NOTE — Progress Notes (Signed)
Please notify him that urine drug screen was positive for amphetamine, methamphetamine. Please advise him to stop taking any substances which is not prescribed by the providers.

## 2022-01-12 ENCOUNTER — Telehealth: Payer: Self-pay

## 2022-01-12 NOTE — Telephone Encounter (Signed)
Per your note: Please notify him that urine drug screen was positive for amphetamine, methamphetamine. Please advise him to stop taking any substances which is not prescribed by the providers.   Called patient to notify of lab results patient stated that he does not know how this got in his system but later stated that he would stop.

## 2022-02-15 ENCOUNTER — Encounter: Payer: Self-pay | Admitting: Acute Care

## 2022-02-15 ENCOUNTER — Ambulatory Visit (INDEPENDENT_AMBULATORY_CARE_PROVIDER_SITE_OTHER): Payer: Medicare HMO | Admitting: Acute Care

## 2022-02-15 DIAGNOSIS — F17210 Nicotine dependence, cigarettes, uncomplicated: Secondary | ICD-10-CM

## 2022-02-15 NOTE — Patient Instructions (Signed)

## 2022-02-15 NOTE — Progress Notes (Signed)
Virtual Visit via Telephone Note  I connected with [place] on 02/15/22 at  4:00 PM EST by telephone and verified that I am speaking with the correct person using two identifiers.  Location: Patient:  At home Provider:  [address], [place], [place], [address]    I discussed the limitations, risks, security and privacy concerns of performing an evaluation and management service by telephone and the availability of in person appointments. I also discussed with the patient that there may be a patient responsible charge related to this service. The patient expressed understanding and agreed to proceed.     Shared Decision Making Visit Lung Cancer Screening Program [id number])   Eligibility: Age 51 y.o. Pack Years Smoking History Calculation 34 pack year smoking history (# packs/per year x # years smoked) Recent History of coughing up blood  no Unexplained weight loss? no ( >Than 15 pounds within the last 6 months ) Prior History Lung / other cancer no (Diagnosis within the last 5 years already requiring surveillance chest CT Scans). Smoking Status Current Smoker Former Smokers: Years since quit:  NA  Quit Date:  NA  Visit Components: Discussion included one or more decision making aids. yes Discussion included risk/benefits of screening. yes Discussion included potential follow up diagnostic testing for abnormal scans. yes Discussion included meaning and risk of over diagnosis. yes Discussion included meaning and risk of False Positives. yes Discussion included meaning of total radiation exposure. yes  Counseling Included: Importance of adherence to annual lung cancer LDCT screening. yes Impact of comorbidities on ability to participate in the program. yes Ability and willingness to under diagnostic treatment. yes  Smoking Cessation Counseling: Current Smokers:  Discussed importance of smoking cessation. yes Information about tobacco cessation classes and  interventions provided to patient. yes Patient provided with "ticket" for LDCT Scan. yes Symptomatic Patient. no  Counseling NA Diagnosis Code: Tobacco Use Z72.0 Asymptomatic Patient yes  Counseling (Intermediate counseling: > three minutes counseling) U0454 Former Smokers:  Discussed the importance of maintaining cigarette abstinence. yes Diagnosis Code: Personal History of Nicotine Dependence. U98.119 Information about tobacco cessation classes and interventions provided to patient. Yes Patient provided with "ticket" for LDCT Scan. yes Written Order for Lung Cancer Screening with LDCT placed in Epic. Yes (CT Chest Lung Cancer Screening Low Dose W/O CM) [id number] Z12.2-Screening of respiratory organs Z87.891-Personal history of nicotine dependence  I have spent 25 minutes of face to face/ virtual visit   time with  Mr. Auld and his sister discussing the risks and benefits of lung cancer screening. We viewed / discussed a power point together that explained in detail the above noted topics. We paused at intervals to allow for questions to be asked and answered to ensure understanding.We discussed that the single most powerful action that he can take to decrease his risk of developing lung cancer is to quit smoking. We discussed whether or not he is ready to commit to setting a quit date. We discussed options for tools to aid in quitting smoking including nicotine replacement therapy, non-nicotine medications, support groups, Quit Smart classes, and behavior modification. We discussed that often times setting smaller, more achievable goals, such as eliminating 1 cigarette a day for a week and then 2 cigarettes a day for a week can be helpful in slowly decreasing the number of cigarettes smoked. This allows for a sense of accomplishment as well as providing a clinical benefit. I provided  him  with smoking cessation  information  with  contact information for community resources, classes, free nicotine  replacement therapy, and access to mobile apps, text messaging, and on-line smoking cessation help. I have also provided  him  the office contact information in the event he needs to contact me, or the screening staff. We discussed the time and location of the scan, and that either Doroteo Glassman RN, Joella Prince, RN  or I will call / send a letter with the results within 24-72 hours of receiving them. The patient verbalized understanding of all of  the above and had no further questions upon leaving the office. They have my contact information in the event they have any further questions.  I spent 3 minutes counseling on smoking cessation and the health risks of continued tobacco abuse.  I explained to the patient that there has been a high incidence of coronary artery disease noted on these exams. I explained that this is a non-gated exam therefore degree or severity cannot be determined. This patient is not on statin therapy. I have asked the patient to follow-up with their PCP regarding any incidental finding of coronary artery disease and management with diet or medication as their PCP  feels is clinically indicated. The patient verbalized understanding of the above and had no further questions upon completion of the visit.      Magdalen Spatz, NP 02/15/2022

## 2022-02-16 ENCOUNTER — Ambulatory Visit
Admission: RE | Admit: 2022-02-16 | Discharge: 2022-02-16 | Disposition: A | Discharge status: Home / Self Care | Payer: Medicare HMO | Source: Ambulatory Visit | Attending: Acute Care | Admitting: Acute Care

## 2022-02-16 DIAGNOSIS — F17210 Nicotine dependence, cigarettes, uncomplicated: Secondary | ICD-10-CM | POA: Diagnosis not present

## 2022-02-16 DIAGNOSIS — Z122 Encounter for screening for malignant neoplasm of respiratory organs: Secondary | ICD-10-CM | POA: Diagnosis not present

## 2022-02-16 DIAGNOSIS — Z87891 Personal history of nicotine dependence: Secondary | ICD-10-CM | POA: Diagnosis not present

## 2022-02-17 DIAGNOSIS — Z862 Personal history of diseases of the blood and blood-forming organs and certain disorders involving the immune mechanism: Secondary | ICD-10-CM | POA: Diagnosis not present

## 2022-02-17 DIAGNOSIS — I10 Essential (primary) hypertension: Secondary | ICD-10-CM | POA: Diagnosis not present

## 2022-02-17 DIAGNOSIS — R7303 Prediabetes: Secondary | ICD-10-CM | POA: Diagnosis not present

## 2022-02-18 DIAGNOSIS — Z0000 Encounter for general adult medical examination without abnormal findings: Secondary | ICD-10-CM | POA: Diagnosis not present

## 2022-02-18 DIAGNOSIS — R7303 Prediabetes: Secondary | ICD-10-CM | POA: Diagnosis not present

## 2022-02-18 DIAGNOSIS — D649 Anemia, unspecified: Secondary | ICD-10-CM | POA: Diagnosis not present

## 2022-02-18 DIAGNOSIS — I10 Essential (primary) hypertension: Secondary | ICD-10-CM | POA: Diagnosis not present

## 2022-02-18 DIAGNOSIS — F17210 Nicotine dependence, cigarettes, uncomplicated: Secondary | ICD-10-CM | POA: Diagnosis not present

## 2022-02-18 DIAGNOSIS — Z1331 Encounter for screening for depression: Secondary | ICD-10-CM | POA: Diagnosis not present

## 2022-02-21 ENCOUNTER — Other Ambulatory Visit: Payer: Self-pay

## 2022-02-21 DIAGNOSIS — Z122 Encounter for screening for malignant neoplasm of respiratory organs: Secondary | ICD-10-CM

## 2022-02-21 DIAGNOSIS — Z87891 Personal history of nicotine dependence: Secondary | ICD-10-CM

## 2022-02-21 DIAGNOSIS — F17210 Nicotine dependence, cigarettes, uncomplicated: Secondary | ICD-10-CM
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
## 2022-03-01 ENCOUNTER — Ambulatory Visit: Payer: Medicare HMO | Admitting: Psychiatry
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
## 2022-03-22 ENCOUNTER — Ambulatory Visit: Payer: Medicare HMO | Admitting: Psychiatry

## 2022-03-25 ENCOUNTER — Other Ambulatory Visit: Payer: Self-pay | Admitting: Psychiatry

## 2022-03-25 DIAGNOSIS — F419 Anxiety disorder, unspecified: Secondary | ICD-10-CM
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
## 2022-04-05 ENCOUNTER — Encounter: Payer: Self-pay | Admitting: Psychiatry

## 2022-04-05 ENCOUNTER — Ambulatory Visit (INDEPENDENT_AMBULATORY_CARE_PROVIDER_SITE_OTHER): Payer: Medicare HMO | Admitting: Psychiatry

## 2022-04-05 ENCOUNTER — Other Ambulatory Visit
Admission: RE | Admit: 2022-04-05 | Discharge: 2022-04-05 | Disposition: A | Discharge status: Home / Self Care | Payer: Medicare HMO | Source: Ambulatory Visit | Attending: Psychiatry | Admitting: Psychiatry

## 2022-04-05 VITALS — BP 144/90 | HR 81 | Temp 98.7°F | Ht 73.0 in | Wt 209.2 lb

## 2022-04-05 DIAGNOSIS — G4700 Insomnia, unspecified: Secondary | ICD-10-CM

## 2022-04-05 DIAGNOSIS — F250 Schizoaffective disorder, bipolar type: Secondary | ICD-10-CM | POA: Insufficient documentation

## 2022-04-05 DIAGNOSIS — F411 Generalized anxiety disorder: Secondary | ICD-10-CM

## 2022-04-05 LAB — COMPREHENSIVE METABOLIC PANEL
ALT: 14 U/L (ref 0–44)
AST: 18 U/L (ref 15–41)
Albumin: 4.4 g/dL (ref 3.5–5.0)
Alkaline Phosphatase: 108 U/L (ref 38–126)
Anion gap: 11 (ref 5–15)
BUN: 9 mg/dL (ref 6–20)
CO2: 26 mmol/L (ref 22–32)
Calcium: 9.3 mg/dL (ref 8.9–10.3)
Chloride: 98 mmol/L (ref 98–111)
Creatinine, Ser: 0.96 mg/dL (ref 0.61–1.24)
GFR, Estimated: >60 mL/min (ref >60)
Glucose, Bld: 104 mg/dL — ABNORMAL HIGH (ref 70–99)
Potassium: 4.4 mmol/L (ref 3.5–5.1)
Sodium: 135 mmol/L (ref 135–145)
Total Bilirubin: 0.4 mg/dL (ref 0.3–1.2)
Total Protein: 7.9 g/dL (ref 6.5–8.1)

## 2022-04-05 LAB — CBC
HCT: 42.5 % (ref 39.0–52.0)
Hemoglobin: 13.7 g/dL (ref 13.0–17.0)
MCH: 27.6 pg (ref 26.0–34.0)
MCHC: 32.2 g/dL (ref 30.0–36.0)
MCV: 85.7 fL (ref 80.0–100.0)
Platelets: 293 10*3/uL (ref 150–400)
RBC: 4.96 MIL/uL (ref 4.22–5.81)
RDW: 13.2 % (ref 11.5–15.5)
WBC: 8.1 10*3/uL (ref 4.0–10.5)
nRBC: 0 % (ref 0.0–0.2)

## 2022-04-05 LAB — VALPROIC ACID LEVEL: Valproic Acid Lvl: <10 ug/mL — ABNORMAL LOW (ref 50.0–100.0)

## 2022-04-08 ENCOUNTER — Ambulatory Visit: Payer: Medicare HMO

## 2022-04-25 ENCOUNTER — Other Ambulatory Visit: Payer: Self-pay | Admitting: Psychiatry

## 2022-04-25 DIAGNOSIS — F419 Anxiety disorder, unspecified: Secondary | ICD-10-CM

## 2022-05-03 DIAGNOSIS — R0602 Shortness of breath: Secondary | ICD-10-CM | POA: Diagnosis not present

## 2022-05-03 DIAGNOSIS — J301 Allergic rhinitis due to pollen: Secondary | ICD-10-CM | POA: Diagnosis not present

## 2022-05-03 DIAGNOSIS — J4489 Other specified chronic obstructive pulmonary disease: Secondary | ICD-10-CM | POA: Diagnosis not present

## 2022-05-27 ENCOUNTER — Other Ambulatory Visit: Payer: Medicare HMO

## 2022-05-30 ENCOUNTER — Ambulatory Visit
Admission: RE | Admit: 2022-05-30 | Discharge: 2022-05-30 | Disposition: A | Discharge status: Home / Self Care | Payer: Medicare HMO | Source: Ambulatory Visit | Attending: Psychiatry | Admitting: Psychiatry

## 2022-05-30 ENCOUNTER — Telehealth: Payer: Self-pay | Admitting: Psychiatry

## 2022-05-30 DIAGNOSIS — F250 Schizoaffective disorder, bipolar type: Secondary | ICD-10-CM | POA: Diagnosis not present

## 2022-05-30 DIAGNOSIS — R9431 Abnormal electrocardiogram [ECG] [EKG]: Secondary | ICD-10-CM | POA: Insufficient documentation
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
## 2022-05-30 NOTE — Telephone Encounter (Signed)
Reviewed EKG. QTc 446 msec, HR 78. 05/2022.   Please inform the patient that the EKG results indicate no significant abnormalities. Advise him to continue their medication regimen as discussed during his last visit.

## 2022-05-31 NOTE — Telephone Encounter (Signed)
EKG reviewed as in the other note.

## 2022-05-31 NOTE — Telephone Encounter (Signed)
Pt.notified

## 2022-06-02 ENCOUNTER — Encounter: Payer: Self-pay | Admitting: Psychiatry

## 2022-06-02 ENCOUNTER — Ambulatory Visit: Payer: Medicare HMO | Admitting: Psychiatry

## 2022-06-02 VITALS — BP 162/98 | HR 98 | Temp 98.5°F | Ht 73.0 in | Wt 205.6 lb

## 2022-06-02 DIAGNOSIS — F411 Generalized anxiety disorder: Secondary | ICD-10-CM

## 2022-06-02 DIAGNOSIS — F3132 Bipolar disorder, current episode depressed, moderate: Secondary | ICD-10-CM | POA: Diagnosis not present

## 2022-06-02 DIAGNOSIS — F1990 Other psychoactive substance use, unspecified, uncomplicated: Secondary | ICD-10-CM | POA: Diagnosis not present

## 2022-06-02 DIAGNOSIS — F250 Schizoaffective disorder, bipolar type: Secondary | ICD-10-CM | POA: Diagnosis not present

## 2022-06-02 DIAGNOSIS — G4700 Insomnia, unspecified: Secondary | ICD-10-CM

## 2022-06-02 DIAGNOSIS — F419 Anxiety disorder, unspecified: Secondary | ICD-10-CM

## 2022-06-02 MED ORDER — CLONAZEPAM 0.5 MG PO TABS
0.5000 mg | ORAL_TABLET | Freq: Two times a day (BID) | ORAL | 1 refills | Status: DC | PRN
Start: 2022-06-07 — End: 2022-11-17

## 2022-06-02 MED ORDER — FLUVOXAMINE MALEATE 100 MG PO TABS: 100.0000 mg | ORAL_TABLET | Freq: Every day | ORAL | 5 refills | Status: DC

## 2022-06-02 MED ORDER — OLANZAPINE 15 MG PO TABS
15.0000 mg | ORAL_TABLET | Freq: Every day | ORAL | 1 refills | Status: DC
Start: 2022-06-02 — End: 2022-09-22
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
## 2022-07-19 ENCOUNTER — Encounter: Payer: Self-pay | Admitting: Psychiatry

## 2022-07-19 ENCOUNTER — Ambulatory Visit (INDEPENDENT_AMBULATORY_CARE_PROVIDER_SITE_OTHER): Payer: Medicare HMO | Admitting: Psychiatry

## 2022-07-19 ENCOUNTER — Other Ambulatory Visit
Admission: RE | Admit: 2022-07-19 | Discharge: 2022-07-19 | Disposition: A | Discharge status: Home / Self Care | Payer: Medicare HMO | Source: Ambulatory Visit | Attending: Psychiatry | Admitting: Psychiatry

## 2022-07-19 VITALS — BP 109/67 | HR 94 | Temp 97.1°F | Ht 73.0 in | Wt 197.6 lb

## 2022-07-19 DIAGNOSIS — F1994 Other psychoactive substance use, unspecified with psychoactive substance-induced mood disorder: Secondary | ICD-10-CM | POA: Diagnosis not present

## 2022-07-19 DIAGNOSIS — F1914 Other psychoactive substance abuse with psychoactive substance-induced mood disorder: Secondary | ICD-10-CM | POA: Insufficient documentation

## 2022-07-19 DIAGNOSIS — F411 Generalized anxiety disorder: Secondary | ICD-10-CM

## 2022-07-19 DIAGNOSIS — F250 Schizoaffective disorder, bipolar type: Secondary | ICD-10-CM

## 2022-07-19 MED ORDER — CLONAZEPAM 0.5 MG PO TABS
0.5000 mg | ORAL_TABLET | Freq: Two times a day (BID) | ORAL | 0 refills | Status: DC | PRN
Start: 2022-08-20 — End: 2022-09-22

## 2022-07-19 MED ORDER — DIVALPROEX SODIUM ER 500 MG PO TB24: 500.0000 mg | ORAL_TABLET | Freq: Every day | ORAL | 2 refills | Status: DC

## 2022-07-19 NOTE — Patient Instructions (Signed)
Continue fluvoxamine 100 mg at night  Continue olanzapine 15 mg at night   SWITCH to depakote ER 500 mg daily  Continue clonazepam 0.5 mg twice a day Continue Trazodone 150 mg at night as needed for sleep Obtain UDS Next appointment: 7/11 at 4 PM

## 2022-07-22 LAB — DRUG SCREEN (9) + CREATININE, UR
BENZODIAZ UR QL: NEGATIVE ng/mL
Barbiturate, Ur: NEGATIVE ng/mL
Cocaine (Metab.), Urine: NEGATIVE ng/mL
Creatinine, Urine: 67 mg/dL (ref 20.0–300.0)
Methadone Screen, Urine: NEGATIVE ng/mL
OPIATE SCREEN URINE: NEGATIVE ng/mL
Oxycodone+Oxymorphone Ur Ql Scn: NEGATIVE ng/mL
PCP, Urine: NEGATIVE ng/mL
Propoxyphene, Urine: NEGATIVE ng/mL
pH, Urine: 5.7 (ref 4.5–8.9)

## 2022-07-22 LAB — AMPHETAMINES (GC/MS), URINE
Amphetamine (GC/MS): 1187 ng/mL
Amphetamine: POSITIVE — AB
Amphetamines: POSITIVE — AB
Methamphetamine (GC/MS): >3000 ng/mL
Methamphetamine, urine: POSITIVE — AB

## 2022-07-24 NOTE — Progress Notes (Signed)
Please contact the patient regarding the results of the urine drug test, which indicate positive for methamphetamine and amphetamine. Please advise him to refrain from using these substances.

## 2022-07-25 ENCOUNTER — Telehealth: Payer: Self-pay

## 2022-07-25 NOTE — Telephone Encounter (Signed)
-----   Message from Neysa Hotter, MD sent at 07/24/2022  9:27 AM EDT ----- Please contact the patient regarding the results of the urine drug test, which indicate positive for methamphetamine and amphetamine. Please advise him to refrain from using these substances.

## 2022-07-25 NOTE — Progress Notes (Signed)
Called to inform patient of the lab results he stated that he does not know how this is in his urine he also stated that maybe it was from being around someone who is using it I informed patient that I could not answer those questions and reminded him again that the provider wants him to restrain from using the substances he voiced understanding

## 2022-07-25 NOTE — Telephone Encounter (Signed)
pt was given his test results and the instructions per dr. Vanetta Shawl orders.

## 2022-08-16 DIAGNOSIS — Z125 Encounter for screening for malignant neoplasm of prostate: Secondary | ICD-10-CM | POA: Diagnosis not present

## 2022-08-16 DIAGNOSIS — Z862 Personal history of diseases of the blood and blood-forming organs and certain disorders involving the immune mechanism: Secondary | ICD-10-CM | POA: Diagnosis not present

## 2022-08-16 DIAGNOSIS — I10 Essential (primary) hypertension: Secondary | ICD-10-CM | POA: Diagnosis not present

## 2022-08-16 DIAGNOSIS — R7303 Prediabetes: Secondary | ICD-10-CM | POA: Diagnosis not present

## 2022-09-06 DIAGNOSIS — K219 Gastro-esophageal reflux disease without esophagitis: Secondary | ICD-10-CM | POA: Diagnosis not present

## 2022-09-06 DIAGNOSIS — R7303 Prediabetes: Secondary | ICD-10-CM | POA: Diagnosis not present

## 2022-09-06 DIAGNOSIS — I10 Essential (primary) hypertension: Secondary | ICD-10-CM | POA: Diagnosis not present

## 2022-09-06 DIAGNOSIS — J449 Chronic obstructive pulmonary disease, unspecified: Secondary | ICD-10-CM | POA: Diagnosis not present
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
## 2022-09-22 ENCOUNTER — Other Ambulatory Visit
Admission: RE | Admit: 2022-09-22 | Discharge: 2022-09-22 | Disposition: A | Discharge status: Home / Self Care | Payer: Medicare HMO | Source: Ambulatory Visit | Attending: Psychiatry | Admitting: Psychiatry

## 2022-09-22 ENCOUNTER — Ambulatory Visit: Payer: Medicare HMO | Admitting: Psychiatry

## 2022-09-22 ENCOUNTER — Other Ambulatory Visit: Payer: Self-pay | Admitting: Psychiatry

## 2022-09-22 ENCOUNTER — Encounter: Payer: Self-pay | Admitting: Psychiatry

## 2022-09-22 VITALS — BP 114/65 | HR 90 | Temp 97.7°F | Ht 73.0 in | Wt 204.4 lb

## 2022-09-22 DIAGNOSIS — F411 Generalized anxiety disorder: Secondary | ICD-10-CM | POA: Diagnosis not present

## 2022-09-22 DIAGNOSIS — G4700 Insomnia, unspecified: Secondary | ICD-10-CM

## 2022-09-22 DIAGNOSIS — F250 Schizoaffective disorder, bipolar type: Secondary | ICD-10-CM

## 2022-09-22 DIAGNOSIS — F1994 Other psychoactive substance use, unspecified with psychoactive substance-induced mood disorder: Secondary | ICD-10-CM

## 2022-09-22 LAB — CBC
HCT: 31.2 % — ABNORMAL LOW (ref 39.0–52.0)
Hemoglobin: 10.7 g/dL — ABNORMAL LOW (ref 13.0–17.0)
MCH: 28.2 pg (ref 26.0–34.0)
MCHC: 34.3 g/dL (ref 30.0–36.0)
MCV: 82.3 fL (ref 80.0–100.0)
Platelets: 204 10*3/uL (ref 150–400)
RBC: 3.79 MIL/uL — ABNORMAL LOW (ref 4.22–5.81)
RDW: 14.6 % (ref 11.5–15.5)
WBC: 7.7 10*3/uL (ref 4.0–10.5)
nRBC: 0 % (ref 0.0–0.2)

## 2022-09-22 LAB — HEPATIC FUNCTION PANEL
ALT: 11 U/L (ref 0–44)
AST: 14 U/L — ABNORMAL LOW (ref 15–41)
Albumin: 3.7 g/dL (ref 3.5–5.0)
Alkaline Phosphatase: 63 U/L (ref 38–126)
Bilirubin, Direct: <0.1 mg/dL (ref 0.0–0.2)
Total Bilirubin: 0.6 mg/dL (ref 0.3–1.2)
Total Protein: 6.5 g/dL (ref 6.5–8.1)

## 2022-09-22 LAB — VALPROIC ACID LEVEL: Valproic Acid Lvl: 43 ug/mL — ABNORMAL LOW (ref 50.0–100.0)

## 2022-09-22 MED ORDER — DIVALPROEX SODIUM ER 500 MG PO TB24: 500.0000 mg | ORAL_TABLET | Freq: Every day | ORAL | 0 refills | Status: DC

## 2022-09-22 MED ORDER — OLANZAPINE 15 MG PO TABS: 15.0000 mg | ORAL_TABLET | Freq: Every day | ORAL | 3 refills | Status: DC

## 2022-09-22 MED ORDER — CLONAZEPAM 0.5 MG PO TABS: 0.5000 mg | ORAL_TABLET | Freq: Two times a day (BID) | ORAL | 0 refills | Status: DC | PRN

## 2022-09-23 ENCOUNTER — Telehealth: Payer: Self-pay | Admitting: Psychiatry

## 2022-09-23 NOTE — Telephone Encounter (Signed)
Could you contact him? The lab results indicate he has anemia, which was not present in his last lab results five months ago. Please advise him to contact his primary care provider for further evaluation and guidance. Notably, Depakote can cause anemia, but this side effect is relatively rare. I recommend that he continues with the same dose at this time, as it has been very effective, unless his primary care provider expresses any concerns.

## 2022-09-23 NOTE — Telephone Encounter (Signed)
Called to inform patient of lab results no answer left voicemail for patient to return call to office 

## 2022-09-26 NOTE — Telephone Encounter (Signed)
Spoke to patient he voiced understanding and stated that he would call his PCP

## 2022-09-29 DIAGNOSIS — D649 Anemia, unspecified: Secondary | ICD-10-CM | POA: Diagnosis not present

## 2022-10-06 DIAGNOSIS — J301 Allergic rhinitis due to pollen: Secondary | ICD-10-CM | POA: Diagnosis not present

## 2022-10-06 DIAGNOSIS — J45901 Unspecified asthma with (acute) exacerbation: Secondary | ICD-10-CM | POA: Diagnosis not present

## 2022-10-06 DIAGNOSIS — J441 Chronic obstructive pulmonary disease with (acute) exacerbation: Secondary | ICD-10-CM | POA: Diagnosis not present

## 2022-10-06 DIAGNOSIS — F17218 Nicotine dependence, cigarettes, with other nicotine-induced disorders: Secondary | ICD-10-CM | POA: Diagnosis not present

## 2022-10-19 ENCOUNTER — Other Ambulatory Visit: Payer: Self-pay | Admitting: Psychiatry

## 2022-10-19 ENCOUNTER — Telehealth: Payer: Self-pay

## 2022-10-19 MED ORDER — TRAZODONE HCL 150 MG PO TABS: 150.0000 mg | ORAL_TABLET | Freq: Every day | ORAL | 5 refills | Status: DC

## 2022-10-19 MED ORDER — CLONAZEPAM 0.5 MG PO TABS: 0.5000 mg | ORAL_TABLET | Freq: Two times a day (BID) | ORAL | 0 refills | Status: DC | PRN

## 2022-10-19 NOTE — Telephone Encounter (Signed)
Clonazepam and trazodone have been ordered. Lamotrigine is prescribed by another provider. He should have enough Depakote and fluvoxamine to last until his next visit. Please advise him to contact the pharmacy to fill the medication.

## 2022-10-19 NOTE — Telephone Encounter (Signed)
Called to inform patient of the message he voiced understanding and stated that he would call the provider for the lamotrigine to be refilled

## 2022-10-19 NOTE — Telephone Encounter (Signed)
Patient called to request a refill for the following medications please advise clonazePAM (KLONOPIN) 0.5 MG tablet  divalproex (DEPAKOTE ER) 500 MG 24 hr tablet  fluvoxaMINE (LUVOX) 100 MG tablet  lamoTRIgine (LAMICTAL) 100 MG tablet  traZODone (DESYREL) 150 MG tablet    Preferred pharmacy [facility] - [place], [place] - [address]

## 2022-10-20 ENCOUNTER — Other Ambulatory Visit: Payer: Self-pay | Admitting: Psychiatry

## 2022-10-25 ENCOUNTER — Telehealth: Payer: Self-pay

## 2022-10-25 ENCOUNTER — Other Ambulatory Visit: Payer: Self-pay | Admitting: Psychiatry

## 2022-10-25 DIAGNOSIS — F3132 Bipolar disorder, current episode depressed, moderate: Secondary | ICD-10-CM

## 2022-10-25 MED ORDER — FLUVOXAMINE MALEATE 100 MG PO TABS
100.0000 mg | ORAL_TABLET | Freq: Every day | ORAL | 5 refills | Status: DC
Start: 2022-11-19 — End: 2023-06-12

## 2022-10-25 NOTE — Telephone Encounter (Signed)
pt mother called states that he needs refills on the luvox . lamtrigine  Pt last seen on 7-11 next appt 9-5.  Pt mother states that he forgot to say anything at his last visit.

## 2022-10-25 NOTE — Telephone Encounter (Signed)
Fluvoxamine has been ordered. Lamotrigine is for seizure, and I have never prescribed it to him. Please advise them to contact the other provider.

## 2022-10-26 NOTE — Telephone Encounter (Signed)
left message that dr. Vanetta Shawl sent medication for the fluvoxamine but that the lamotrigine someone else prescribes. (per dr. Vanetta Shawl order)

## 2022-10-27 DIAGNOSIS — H9191 Unspecified hearing loss, right ear: Secondary | ICD-10-CM | POA: Diagnosis not present

## 2022-10-27 DIAGNOSIS — F068 Other specified mental disorders due to known physiological condition: Secondary | ICD-10-CM | POA: Diagnosis not present

## 2022-10-27 DIAGNOSIS — F411 Generalized anxiety disorder: Secondary | ICD-10-CM | POA: Diagnosis not present

## 2022-10-27 DIAGNOSIS — S069X0S Unspecified intracranial injury without loss of consciousness, sequela: Secondary | ICD-10-CM | POA: Diagnosis not present

## 2022-10-27 DIAGNOSIS — R569 Unspecified convulsions: Secondary | ICD-10-CM | POA: Diagnosis not present
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
## 2022-11-17 ENCOUNTER — Ambulatory Visit: Payer: Medicare HMO | Admitting: Psychiatry

## 2022-11-17 ENCOUNTER — Encounter: Payer: Self-pay | Admitting: Psychiatry

## 2022-11-17 VITALS — BP 134/83 | HR 76 | Temp 98.2°F | Ht 73.0 in | Wt 201.2 lb

## 2022-11-17 DIAGNOSIS — F1994 Other psychoactive substance use, unspecified with psychoactive substance-induced mood disorder: Secondary | ICD-10-CM

## 2022-11-17 DIAGNOSIS — F411 Generalized anxiety disorder: Secondary | ICD-10-CM

## 2022-11-17 DIAGNOSIS — G4700 Insomnia, unspecified: Secondary | ICD-10-CM | POA: Diagnosis not present

## 2022-11-17 DIAGNOSIS — F250 Schizoaffective disorder, bipolar type: Secondary | ICD-10-CM | POA: Diagnosis not present

## 2022-11-17 DIAGNOSIS — F1290 Cannabis use, unspecified, uncomplicated: Secondary | ICD-10-CM

## 2022-11-17 MED ORDER — OLANZAPINE 20 MG PO TABS: 20.0000 mg | ORAL_TABLET | Freq: Every day | ORAL | 1 refills | Status: DC

## 2022-11-17 MED ORDER — DIVALPROEX SODIUM ER 500 MG PO TB24: 500.0000 mg | ORAL_TABLET | Freq: Every day | ORAL | 1 refills | Status: DC

## 2022-11-17 MED ORDER — CLONAZEPAM 0.5 MG PO TABS: 0.5000 mg | ORAL_TABLET | Freq: Two times a day (BID) | ORAL | 1 refills | Status: DC | PRN

## 2022-11-17 NOTE — Patient Instructions (Signed)
Continue fluvoxamine 100 mg at night  Increase olanzapine 20 mg at night  Continue Depakote ER 500 mg daily  Continue clonazepam 0.5 mg twice a day  Continue Trazodone 150 mg at night as needed for sleep  Next appointment: 10/29 at 10 30

## 2022-12-20 ENCOUNTER — Telehealth: Payer: Self-pay

## 2022-12-20 NOTE — Telephone Encounter (Signed)
went online to [email or web address] and submitted the prior auth - it came back at "available without a authorization"

## 2022-12-20 NOTE — Telephone Encounter (Signed)
received fax that a prior auth was needed for the clonazepam.

## 2022-12-20 NOTE — Telephone Encounter (Signed)
called pharmacy and let them know and they were able to get it to go through with insurance.

## 2023-01-02 DIAGNOSIS — I10 Essential (primary) hypertension: Secondary | ICD-10-CM | POA: Diagnosis not present

## 2023-01-02 DIAGNOSIS — K625 Hemorrhage of anus and rectum: Secondary | ICD-10-CM | POA: Diagnosis not present

## 2023-01-02 DIAGNOSIS — K5900 Constipation, unspecified: Secondary | ICD-10-CM | POA: Diagnosis not present

## 2023-01-02 DIAGNOSIS — D649 Anemia, unspecified: Secondary | ICD-10-CM | POA: Diagnosis not present

## 2023-01-02 DIAGNOSIS — R600 Localized edema: Secondary | ICD-10-CM | POA: Diagnosis not present
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
## 2023-01-10 ENCOUNTER — Encounter: Payer: Self-pay | Admitting: Psychiatry

## 2023-01-10 ENCOUNTER — Other Ambulatory Visit
Admission: RE | Admit: 2023-01-10 | Discharge: 2023-01-10 | Disposition: A | Discharge status: Home / Self Care | Payer: Medicare HMO | Source: Ambulatory Visit | Attending: Psychiatry | Admitting: Psychiatry

## 2023-01-10 ENCOUNTER — Ambulatory Visit: Payer: Medicare HMO | Admitting: Psychiatry

## 2023-01-10 VITALS — BP 130/83 | HR 71 | Temp 97.4°F | Ht 73.0 in | Wt 200.8 lb

## 2023-01-10 DIAGNOSIS — F250 Schizoaffective disorder, bipolar type: Secondary | ICD-10-CM | POA: Diagnosis not present

## 2023-01-10 DIAGNOSIS — Z79899 Other long term (current) drug therapy: Secondary | ICD-10-CM

## 2023-01-10 DIAGNOSIS — G4700 Insomnia, unspecified: Secondary | ICD-10-CM

## 2023-01-10 DIAGNOSIS — F411 Generalized anxiety disorder: Secondary | ICD-10-CM | POA: Diagnosis not present

## 2023-01-10 DIAGNOSIS — F1994 Other psychoactive substance use, unspecified with psychoactive substance-induced mood disorder: Secondary | ICD-10-CM | POA: Diagnosis not present

## 2023-01-10 LAB — CBC WITH DIFFERENTIAL/PLATELET
Abs Immature Granulocytes: 0.03 10*3/uL (ref 0.00–0.07)
Basophils Absolute: 0.1 10*3/uL (ref 0.0–0.1)
Basophils Relative: 1 %
Eosinophils Absolute: 0.6 10*3/uL — ABNORMAL HIGH (ref 0.0–0.5)
Eosinophils Relative: 7 %
HCT: 37.7 % — ABNORMAL LOW (ref 39.0–52.0)
Hemoglobin: 12.5 g/dL — ABNORMAL LOW (ref 13.0–17.0)
Immature Granulocytes: 0 %
Lymphocytes Relative: 38 %
Lymphs Abs: 3.2 10*3/uL (ref 0.7–4.0)
MCH: 28 pg (ref 26.0–34.0)
MCHC: 33.2 g/dL (ref 30.0–36.0)
MCV: 84.3 fL (ref 80.0–100.0)
Monocytes Absolute: 0.6 10*3/uL (ref 0.1–1.0)
Monocytes Relative: 7 %
Neutro Abs: 3.9 10*3/uL (ref 1.7–7.7)
Neutrophils Relative %: 47 %
Platelets: 272 10*3/uL (ref 150–400)
RBC: 4.47 MIL/uL (ref 4.22–5.81)
RDW: 13.7 % (ref 11.5–15.5)
WBC: 8.4 10*3/uL (ref 4.0–10.5)
nRBC: 0 % (ref 0.0–0.2)

## 2023-01-10 LAB — VALPROIC ACID LEVEL: Valproic Acid Lvl: 19 ug/mL — ABNORMAL LOW (ref 50.0–100.0)

## 2023-01-10 LAB — COMPREHENSIVE METABOLIC PANEL
ALT: 11 U/L (ref 0–44)
AST: 14 U/L — ABNORMAL LOW (ref 15–41)
Albumin: 4.5 g/dL (ref 3.5–5.0)
Alkaline Phosphatase: 86 U/L (ref 38–126)
Anion gap: 11 (ref 5–15)
BUN: 8 mg/dL (ref 6–20)
CO2: 28 mmol/L (ref 22–32)
Calcium: 9.1 mg/dL (ref 8.9–10.3)
Chloride: 98 mmol/L (ref 98–111)
Creatinine, Ser: 0.99 mg/dL (ref 0.61–1.24)
GFR, Estimated: >60 mL/min (ref >60)
Glucose, Bld: 95 mg/dL (ref 70–99)
Potassium: 4.4 mmol/L (ref 3.5–5.1)
Sodium: 137 mmol/L (ref 135–145)
Total Bilirubin: 0.5 mg/dL (ref 0.3–1.2)
Total Protein: 7.6 g/dL (ref 6.5–8.1)

## 2023-01-11 ENCOUNTER — Telehealth: Payer: Self-pay | Admitting: Psychiatry

## 2023-01-11 DIAGNOSIS — Z79899 Other long term (current) drug therapy: Secondary | ICD-10-CM

## 2023-01-11 MED ORDER — DIVALPROEX SODIUM ER 250 MG PO TB24: 250.0000 mg | ORAL_TABLET | Freq: Every day | ORAL | 1 refills | Status: DC

## 2023-01-11 MED ORDER — OLANZAPINE 20 MG PO TABS: 20.0000 mg | ORAL_TABLET | Freq: Every day | ORAL | 1 refills | Status: DC

## 2023-01-11 MED ORDER — CLONAZEPAM 0.5 MG PO TABS: 0.5000 mg | ORAL_TABLET | Freq: Two times a day (BID) | ORAL | 1 refills | Status: DC | PRN

## 2023-01-11 NOTE — Telephone Encounter (Signed)
This Clinical research associate has discussed the lab results with the patient, who was calm and receptive during the conversation.  - He is willing to try a higher dose of Depakote, totaling 750 mg daily, and will be monitored for any rash due to the possible interaction with lamotrigine. - Another blood test will be obtained after one week of starting this medication. - He agrees to follow up in December   The above was also communicated with his mother.

## 2023-01-11 NOTE — Progress Notes (Signed)
-   The labs are within an acceptable range, though he does have some anemia, which appears to be back to his baseline.  - Could you ask if he would be open to trying a higher dose of Depakote, totaling 750 mg daily? Since Depakote can interact with lamotrigine, please advise him to contact the office if he develops any rash, and to visit urgent or emergent care if the rash becomes severe. He also needs to get blood test, five days after he starts the new dose.  - If he agrees to increase the Depakote dose, please ensure he has a follow-up appointment with me. Connect him to the front desk to schedule the visit, as he was uncertain last time if he would continue seeing me.

## 2023-01-11 NOTE — Progress Notes (Signed)
I was able to communicate the above with the patient. Details in the other note.

## 2023-01-16 ENCOUNTER — Other Ambulatory Visit
Admission: RE | Admit: 2023-01-16 | Discharge: 2023-01-16 | Disposition: A | Discharge status: Home / Self Care | Payer: Medicare HMO | Source: Ambulatory Visit | Attending: Psychiatry | Admitting: Psychiatry

## 2023-01-16 DIAGNOSIS — Z79899 Other long term (current) drug therapy: Secondary | ICD-10-CM | POA: Diagnosis not present

## 2023-01-16 LAB — CBC
HCT: 37.5 % — ABNORMAL LOW (ref 39.0–52.0)
Hemoglobin: 12.6 g/dL — ABNORMAL LOW (ref 13.0–17.0)
MCH: 28.1 pg (ref 26.0–34.0)
MCHC: 33.6 g/dL (ref 30.0–36.0)
MCV: 83.7 fL (ref 80.0–100.0)
Platelets: 314 10*3/uL (ref 150–400)
RBC: 4.48 MIL/uL (ref 4.22–5.81)
RDW: 13.9 % (ref 11.5–15.5)
WBC: 7.4 10*3/uL (ref 4.0–10.5)
nRBC: 0 % (ref 0.0–0.2)

## 2023-01-16 LAB — HEPATIC FUNCTION PANEL
ALT: 11 U/L (ref 0–44)
AST: 14 U/L — ABNORMAL LOW (ref 15–41)
Albumin: 4.4 g/dL (ref 3.5–5.0)
Alkaline Phosphatase: 79 U/L (ref 38–126)
Bilirubin, Direct: <0.1 mg/dL (ref 0.0–0.2)
Total Bilirubin: 0.5 mg/dL (ref <1.2)
Total Protein: 7.9 g/dL (ref 6.5–8.1)

## 2023-01-16 LAB — VALPROIC ACID LEVEL: Valproic Acid Lvl: 26 ug/mL — ABNORMAL LOW (ref 50.0–100.0)

## 2023-01-16 NOTE — Progress Notes (Signed)
Please contact him regarding the blood test. The labs are within the acceptable range. While we may consider increasing the Depakote dose in the future, advise him to continue with the current dosage of 750 mg daily. Thanks.

## 2023-01-16 NOTE — Progress Notes (Signed)
Called patient as instructed by the provider to discuss lab results no answer left voicemail for patient to return the call to the office

## 2023-01-17 ENCOUNTER — Other Ambulatory Visit: Payer: Self-pay | Admitting: Psychiatry

## 2023-01-17 ENCOUNTER — Telehealth: Payer: Self-pay

## 2023-01-17 DIAGNOSIS — Z79899 Other long term (current) drug therapy: Secondary | ICD-10-CM

## 2023-01-17 MED ORDER — DIVALPROEX SODIUM ER 500 MG PO TB24: 500.0000 mg | ORAL_TABLET | Freq: Every day | ORAL | 1 refills | Status: DC

## 2023-01-17 NOTE — Telephone Encounter (Signed)
pt mother was called and she states that he is taking 500mg . she was told that the 250mg  was sent to the pharmacy on 10-30. pt is suppose to take the 500mg  and 250mg  to make 750mg . she states that also needs a refill on the 500mg 

## 2023-01-17 NOTE — Telephone Encounter (Signed)
It was previously advised to take Depakote 750 mg daily. Please confirm that he has been taking this dose. I did not make any changes to the clonazepam, so please ensure he stays on the same dose.

## 2023-01-17 NOTE — Telephone Encounter (Signed)
If he has not been taking that dose, he will need to have another blood test. Please advise him to get labs done at [facility], one week after starting Depakote 750 mg daily. Please remind them of the potential side effect of any rash.

## 2023-01-17 NOTE — Telephone Encounter (Signed)
Pt mother was notified

## 2023-01-17 NOTE — Telephone Encounter (Signed)
she states that he does not want to stop the clonazePAM. so is the Depakote still going to be increased to 750mg 

## 2023-02-20 ENCOUNTER — Ambulatory Visit
Admission: RE | Admit: 2023-02-20 | Discharge: 2023-02-20 | Disposition: A | Discharge status: Home / Self Care | Payer: Medicare HMO | Source: Ambulatory Visit | Attending: Family Medicine | Admitting: Family Medicine

## 2023-02-20 DIAGNOSIS — Z122 Encounter for screening for malignant neoplasm of respiratory organs: Secondary | ICD-10-CM | POA: Insufficient documentation

## 2023-02-20 DIAGNOSIS — F17210 Nicotine dependence, cigarettes, uncomplicated: Secondary | ICD-10-CM | POA: Diagnosis not present

## 2023-02-20 DIAGNOSIS — Z87891 Personal history of nicotine dependence: Secondary | ICD-10-CM | POA: Diagnosis not present

## 2023-02-20 DIAGNOSIS — R7303 Prediabetes: Secondary | ICD-10-CM | POA: Diagnosis not present

## 2023-02-20 DIAGNOSIS — I10 Essential (primary) hypertension: Secondary | ICD-10-CM | POA: Diagnosis not present

## 2023-02-27 DIAGNOSIS — Z1331 Encounter for screening for depression: Secondary | ICD-10-CM | POA: Diagnosis not present

## 2023-02-27 DIAGNOSIS — D649 Anemia, unspecified: Secondary | ICD-10-CM | POA: Diagnosis not present

## 2023-02-27 DIAGNOSIS — Z0000 Encounter for general adult medical examination without abnormal findings: Secondary | ICD-10-CM | POA: Diagnosis not present

## 2023-02-27 DIAGNOSIS — I10 Essential (primary) hypertension: Secondary | ICD-10-CM | POA: Diagnosis not present

## 2023-02-27 DIAGNOSIS — F17210 Nicotine dependence, cigarettes, uncomplicated: Secondary | ICD-10-CM | POA: Diagnosis not present

## 2023-02-27 DIAGNOSIS — L03116 Cellulitis of left lower limb: Secondary | ICD-10-CM | POA: Diagnosis not present

## 2023-02-27 DIAGNOSIS — R7303 Prediabetes: Secondary | ICD-10-CM | POA: Diagnosis not present

## 2023-03-01 DIAGNOSIS — F17210 Nicotine dependence, cigarettes, uncomplicated: Secondary | ICD-10-CM | POA: Diagnosis not present

## 2023-03-01 DIAGNOSIS — J449 Chronic obstructive pulmonary disease, unspecified: Secondary | ICD-10-CM | POA: Diagnosis not present

## 2023-03-01 DIAGNOSIS — J301 Allergic rhinitis due to pollen: Secondary | ICD-10-CM | POA: Diagnosis not present

## 2023-03-06 ENCOUNTER — Other Ambulatory Visit: Payer: Self-pay | Admitting: Acute Care

## 2023-03-06 DIAGNOSIS — Z122 Encounter for screening for malignant neoplasm of respiratory organs: Secondary | ICD-10-CM

## 2023-03-06 DIAGNOSIS — F17210 Nicotine dependence, cigarettes, uncomplicated: Secondary | ICD-10-CM

## 2023-03-06 DIAGNOSIS — Z87891 Personal history of nicotine dependence: Secondary | ICD-10-CM
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
## 2023-03-13 DIAGNOSIS — F17210 Nicotine dependence, cigarettes, uncomplicated: Secondary | ICD-10-CM | POA: Diagnosis not present

## 2023-03-13 DIAGNOSIS — J439 Emphysema, unspecified: Secondary | ICD-10-CM | POA: Diagnosis not present

## 2023-03-13 DIAGNOSIS — I2510 Atherosclerotic heart disease of native coronary artery without angina pectoris: Secondary | ICD-10-CM | POA: Diagnosis not present

## 2023-03-13 DIAGNOSIS — I700 Atherosclerosis of aorta: Secondary | ICD-10-CM | POA: Diagnosis not present

## 2023-03-14 ENCOUNTER — Ambulatory Visit: Payer: Medicare HMO | Admitting: Psychiatry

## 2023-03-21 ENCOUNTER — Other Ambulatory Visit: Payer: Self-pay | Admitting: Psychiatry

## 2023-03-24 ENCOUNTER — Other Ambulatory Visit: Payer: Self-pay | Admitting: Cardiology

## 2023-03-24 DIAGNOSIS — F17200 Nicotine dependence, unspecified, uncomplicated: Secondary | ICD-10-CM | POA: Diagnosis not present

## 2023-03-24 DIAGNOSIS — I2510 Atherosclerotic heart disease of native coronary artery without angina pectoris: Secondary | ICD-10-CM | POA: Diagnosis not present

## 2023-03-24 DIAGNOSIS — R0789 Other chest pain: Secondary | ICD-10-CM | POA: Diagnosis not present

## 2023-03-24 DIAGNOSIS — R0609 Other forms of dyspnea: Secondary | ICD-10-CM | POA: Diagnosis not present

## 2023-03-24 DIAGNOSIS — J449 Chronic obstructive pulmonary disease, unspecified: Secondary | ICD-10-CM | POA: Diagnosis not present

## 2023-03-24 DIAGNOSIS — I10 Essential (primary) hypertension: Secondary | ICD-10-CM | POA: Diagnosis not present

## 2023-03-25 ENCOUNTER — Emergency Department
Admission: EM | Admit: 2023-03-25 | Discharge: 2023-03-26 | Disposition: A | Discharge status: Other Acute Inpatient Hospital | Payer: Medicare HMO | Attending: Emergency Medicine | Admitting: Emergency Medicine

## 2023-03-25 DIAGNOSIS — R4689 Other symptoms and signs involving appearance and behavior: Secondary | ICD-10-CM | POA: Diagnosis present

## 2023-03-25 DIAGNOSIS — F22 Delusional disorders: Secondary | ICD-10-CM | POA: Diagnosis not present

## 2023-03-25 DIAGNOSIS — F319 Bipolar disorder, unspecified: Secondary | ICD-10-CM | POA: Diagnosis present

## 2023-03-25 DIAGNOSIS — F99 Mental disorder, not otherwise specified: Secondary | ICD-10-CM | POA: Diagnosis not present

## 2023-03-25 DIAGNOSIS — Z87820 Personal history of traumatic brain injury: Secondary | ICD-10-CM | POA: Diagnosis not present

## 2023-03-25 DIAGNOSIS — Z79899 Other long term (current) drug therapy: Secondary | ICD-10-CM | POA: Insufficient documentation

## 2023-03-25 LAB — CBC
HCT: 34.9 % — ABNORMAL LOW (ref 39.0–52.0)
Hemoglobin: 11.6 g/dL — ABNORMAL LOW (ref 13.0–17.0)
MCH: 28.6 pg (ref 26.0–34.0)
MCHC: 33.2 g/dL (ref 30.0–36.0)
MCV: 86 fL (ref 80.0–100.0)
Platelets: 305 10*3/uL (ref 150–400)
RBC: 4.06 MIL/uL — ABNORMAL LOW (ref 4.22–5.81)
RDW: 14.1 % (ref 11.5–15.5)
WBC: 12.9 10*3/uL — ABNORMAL HIGH (ref 4.0–10.5)
nRBC: 0 % (ref 0.0–0.2)

## 2023-03-25 LAB — COMPREHENSIVE METABOLIC PANEL
ALT: 66 U/L — ABNORMAL HIGH (ref 0–44)
AST: 31 U/L (ref 15–41)
Albumin: 4.3 g/dL (ref 3.5–5.0)
Alkaline Phosphatase: 146 U/L — ABNORMAL HIGH (ref 38–126)
Anion gap: 11 (ref 5–15)
BUN: 26 mg/dL — ABNORMAL HIGH (ref 6–20)
CO2: 27 mmol/L (ref 22–32)
Calcium: 9.1 mg/dL (ref 8.9–10.3)
Chloride: 98 mmol/L (ref 98–111)
Creatinine, Ser: 0.79 mg/dL (ref 0.61–1.24)
GFR, Estimated: >60 mL/min (ref >60)
Glucose, Bld: 93 mg/dL (ref 70–99)
Potassium: 3.8 mmol/L (ref 3.5–5.1)
Sodium: 136 mmol/L (ref 135–145)
Total Bilirubin: 0.7 mg/dL (ref 0.0–1.2)
Total Protein: 7.5 g/dL (ref 6.5–8.1)

## 2023-03-25 LAB — ETHANOL: Alcohol, Ethyl (B): <10 mg/dL (ref <10)

## 2023-03-25 LAB — SALICYLATE LEVEL: Salicylate Lvl: <7 mg/dL — ABNORMAL LOW (ref 7.0–30.0)

## 2023-03-25 LAB — VALPROIC ACID LEVEL: Valproic Acid Lvl: <10 ug/mL — ABNORMAL LOW (ref 50.0–100.0)

## 2023-03-25 LAB — ACETAMINOPHEN LEVEL: Acetaminophen (Tylenol), Serum: <10 ug/mL — ABNORMAL LOW (ref 10–30)

## 2023-03-25 MED ORDER — TRAZODONE HCL 50 MG PO TABS
150.0000 mg | ORAL_TABLET | Freq: Once | ORAL | Status: AC
  Administered 2023-03-25: 150 mg via ORAL
  Filled 2023-03-25: qty 1

## 2023-03-25 MED ORDER — ACETAMINOPHEN 500 MG PO TABS
1000.0000 mg | ORAL_TABLET | Freq: Once | ORAL | Status: DC
  Filled 2023-03-25: qty 2

## 2023-03-25 NOTE — ED Provider Triage Note (Signed)
 Emergency Medicine Provider Triage Evaluation Note  [name] , a 53 y.o. male  was evaluated in triage.  Brought in by police because threatening to kill neighbors, walking around with crossbow. Off psych meds. Patient declines this. Reports he doesn't want to talk because he has a spot on my tongue. Reports he has problems with his neighbors. Denies SI/HI. Denies AVH. Denies drug use.  Review of Systems  Positive: Denies complaints Negative: SI, HI, AVH  Physical Exam  There were no vitals taken for this visit. Gen:   Awake, no distress   Resp:  Normal effort  MSK:   Moves extremities without difficulty  Other:    Medical Decision Making  Medically screening exam initiated at 5:58 PM.  Appropriate orders placed.  [name] was informed that the remainder of the evaluation will be completed by another provider, this initial triage assessment does not replace that evaluation, and the importance of remaining in the ED until their evaluation is complete.     [name] E, PA-C 03/25/23 1802

## 2023-03-25 NOTE — ED Notes (Signed)
 Patient asking several different staff members to use the phone even after it has been explained that it after phone hours.

## 2023-03-25 NOTE — ED Triage Notes (Signed)
 Pt presents w/ [facility] Department under IVC.  Per IVC paperwork, Deputies have received multiple calls today about the Pt.  Per Pt's mother, he has been off his medications x3 days and talking out of his head.  Pt has been walking around and crawling under his house w/ a cross bow because he thinks his neighbors are plotting to kill him.  Pt reports slacking off medications x2 days.  Denies SI/HI/AVH.  Denies threatening neighbors.  Denies carrying cross bow and threatening neighbors.   Hx of TBI.  Pt c/o knot on tongue.

## 2023-03-25 NOTE — ED Notes (Signed)
Pt went to the bathroom.

## 2023-03-25 NOTE — BH Assessment (Signed)
 TTS unable to complete consult.  Patient refuses to wake up to participate in assessment.

## 2023-03-25 NOTE — ED Provider Notes (Signed)
 [facility] Provider Note    Event Date/Time   First MD Initiated Contact with Patient 03/25/23 1827     (approximate)   History   IVC  and Paranoid   HPI  [name] is a 53 y.o. male   who presents to the emergency department today under IVC because of concerns for concerning behavior.  Per chart review patient does have history of bipolar.  Patient states he has not taken his medication for the past couple days.  Today states that he was concerned about his neighbors.  He denies any thoughts about wanting to harm himself.      Physical Exam   Triage Vital Signs: ED Triage Vitals  Encounter Vitals Group     BP 03/25/23 1806 (!) 169/102     Systolic BP Percentile --      Diastolic BP Percentile --      Pulse Rate 03/25/23 1806 96     Resp 03/25/23 1806 18     Temp 03/25/23 1806 97.8 F (36.6 C)     Temp Source 03/25/23 1806 Oral     SpO2 03/25/23 1806 99 %     Weight 03/25/23 1807 200 lb (90.7 kg)     Height 03/25/23 1807 6' 1 (1.854 m)     Head Circumference --      Peak Flow --      Pain Score 03/25/23 1806 10     Pain Loc --      Pain Education --      Exclude from Growth Chart --     Most recent vital signs: Vitals:   03/25/23 1806  BP: (!) 169/102  Pulse: 96  Resp: 18  Temp: 97.8 F (36.6 C)  SpO2: 99%   General: Awake, alert. CV:  Good peripheral perfusion.  Resp:  Normal effort.  Abd:  No distention.  Other:  Does not appear to be responding to internal stimuli.   ED Results / Procedures / Treatments   Labs (all labs ordered are listed, but only abnormal results are displayed) Labs Reviewed  CBC - Abnormal; Notable for the following components:      Result Value   WBC 12.9 (*)    RBC 4.06 (*)    Hemoglobin 11.6 (*)    HCT 34.9 (*)    All other components within normal limits  COMPREHENSIVE METABOLIC PANEL  ETHANOL  SALICYLATE LEVEL  ACETAMINOPHEN  LEVEL  URINE DRUG SCREEN, QUALITATIVE ([facility] ONLY)      EKG  None   RADIOLOGY None   PROCEDURES:  Critical Care performed: No   MEDICATIONS ORDERED IN ED: Medications - No data to display   IMPRESSION / MDM / ASSESSMENT AND PLAN / ED COURSE  I reviewed the triage vital signs and the nursing notes.                              Differential diagnosis includes, but is not limited to, drug induced mood disorder, psychiatric illness  Patient's presentation is most consistent with acute presentation with potential threat to life or bodily function.   Patient presented to the emergency department today under IVC because of concerns for abnormal behavior.  On exam patient does admit to not taking his medication for the past few days.  Will continue IVC and have psychiatry evaluate.  The patient has been placed in psychiatric observation due to the need to provide a  safe environment for the patient while obtaining psychiatric consultation and evaluation, as well as ongoing medical and medication management to treat the patient's condition.  The patient has been placed under full IVC at this time.    FINAL CLINICAL IMPRESSION(S) / ED DIAGNOSES   Final diagnoses:  Psychiatric illness     Note:  This document was prepared using Dragon voice recognition software and may include unintentional dictation errors.    Floy Roberts, MD 03/25/23 [phone]

## 2023-03-25 NOTE — ED Notes (Signed)
 TTS now in to speak with patient.

## 2023-03-25 NOTE — ED Notes (Signed)
 Pt is Sleeping will attempt vitals when awake.

## 2023-03-26 DIAGNOSIS — I10 Essential (primary) hypertension: Secondary | ICD-10-CM | POA: Diagnosis not present

## 2023-03-26 DIAGNOSIS — F3163 Bipolar disorder, current episode mixed, severe, without psychotic features: Secondary | ICD-10-CM | POA: Diagnosis not present

## 2023-03-26 DIAGNOSIS — Z87820 Personal history of traumatic brain injury: Secondary | ICD-10-CM | POA: Diagnosis not present

## 2023-03-26 DIAGNOSIS — G4089 Other seizures: Secondary | ICD-10-CM | POA: Diagnosis not present

## 2023-03-26 DIAGNOSIS — J4489 Other specified chronic obstructive pulmonary disease: Secondary | ICD-10-CM | POA: Diagnosis not present

## 2023-03-26 DIAGNOSIS — F1910 Other psychoactive substance abuse, uncomplicated: Secondary | ICD-10-CM | POA: Diagnosis not present

## 2023-03-26 DIAGNOSIS — F319 Bipolar disorder, unspecified: Secondary | ICD-10-CM | POA: Diagnosis not present

## 2023-03-26 DIAGNOSIS — F418 Other specified anxiety disorders: Secondary | ICD-10-CM | POA: Diagnosis not present

## 2023-03-26 DIAGNOSIS — J449 Chronic obstructive pulmonary disease, unspecified: Secondary | ICD-10-CM | POA: Diagnosis not present

## 2023-03-26 DIAGNOSIS — R42 Dizziness and giddiness: Secondary | ICD-10-CM | POA: Diagnosis not present

## 2023-03-26 DIAGNOSIS — K219 Gastro-esophageal reflux disease without esophagitis: Secondary | ICD-10-CM | POA: Diagnosis not present

## 2023-03-26 DIAGNOSIS — K0889 Other specified disorders of teeth and supporting structures: Secondary | ICD-10-CM | POA: Diagnosis not present

## 2023-03-26 DIAGNOSIS — F99 Mental disorder, not otherwise specified: Secondary | ICD-10-CM

## 2023-03-26 LAB — URINE DRUG SCREEN, QUALITATIVE (ARMC ONLY)
Amphetamines, Ur Screen: POSITIVE — AB
Barbiturates, Ur Screen: NOT DETECTED
Benzodiazepine, Ur Scrn: NOT DETECTED
Cannabinoid 50 Ng, Ur ~~LOC~~: POSITIVE — AB
Cocaine Metabolite,Ur ~~LOC~~: NOT DETECTED
MDMA (Ecstasy)Ur Screen: NOT DETECTED
Methadone Scn, Ur: NOT DETECTED
Opiate, Ur Screen: NOT DETECTED
Phencyclidine (PCP) Ur S: NOT DETECTED
Tricyclic, Ur Screen: NOT DETECTED

## 2023-03-26 NOTE — ED Notes (Signed)
 Julious Payer in ED23 Accepted to [facility], accepting doctor Sherrian Divers MD - [phone] for report Admission @ [facility] [name]

## 2023-03-26 NOTE — ED Notes (Signed)
 Pt provided with lunch tray. Pt sitting up eating.

## 2023-03-26 NOTE — ED Notes (Signed)
 Pt given breakfast tray

## 2023-03-26 NOTE — ED Provider Notes (Signed)
 Emergency Medicine Observation Re-evaluation Note  [name] is a 53 y.o. male, seen on rounds today.  Pt initially presented to the ED for complaints of IVC  and Paranoid  Currently, the patient is is no acute distress. Denies any concerns at this time.  Physical Exam  Blood pressure (!) 169/102, pulse 96, temperature 97.8 F (36.6 C), temperature source Oral, resp. rate 18, height 6' 1 (1.854 m), weight 90.7 kg, SpO2 99%.  Physical Exam: General: No apparent distress Pulm: Normal WOB Neuro: Moving all extremities Psych: Resting comfortably     ED Course / MDM     I have reviewed the labs performed to date as well as medications administered while in observation.  Recent changes in the last 24 hours include: No acute events overnight.  Plan   Current plan: Patient awaiting psychiatric disposition. Patient is under full IVC at this time.    [name], Timothy SAILOR, DO 03/26/23 [id number]

## 2023-03-26 NOTE — ED Notes (Signed)
 Legal documents scanned into pt chart. Wasn't able to e-file docu due to website technical difficulties
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
## 2023-03-26 NOTE — ED Notes (Signed)
 IVC/ rec psych inpt

## 2023-03-26 NOTE — [facility] Counselor (Signed)
 Per [facility] APP (Timothy D. ), patient to be referred out of system.   Referral information for Psychiatric Hospitalization faxed to;    [facility] [phone]), no appropriate beds.   [name] [phone]),    [address] [phone]),   [place] [phone], [phone], [phone] or [phone]),    [place] [phone])   [address] [phone]),    [name] [phone]- [phone]),    [facility] [phone] phone-- [phone])   [name] [phone]),   [name] [phone])   [name] [phone]).   [facility] [phone])

## 2023-03-26 NOTE — BH Assessment (Signed)
 Comprehensive Clinical Assessment (CCA) Screening, Triage and Referral Note  03/26/2023 [name] [id number]  Chief Complaint:   Pt is a 53 year old male, presenting to the ED via  [facility] Department under IVC.  Per IVC paperwork, deputies have received multiple calls today about the Pt.  Per Pt's mother, he has been off his medications x3 days and talking out of his head.  Pt has been walking around and crawling under his house w/ a cross bow because he thinks his neighbors are plotting to kill him.   Pt reports slacking off medications x2 days.  He reports not being able to sleep for a couple of days.  Denies SI/HI/AVH. He denies having the cross bow and threatening to kill his neighbors.  He states his neighbors are the ones bothering him.  Pt has a hx of TBI.   Visit Diagnosis: Paranoia  Patient Reported Information How did you hear about us ? No data recorded What Is the Reason for Your Visit/Call Today? Patient thinks his neighbors are out to get him.  Per his neighbor's report, patient had a crossbow threatening to shoot everyone.  How Long Has This Been Causing You Problems? 1 wk - 1 month  What Do You Feel Would Help You the Most Today? Medication(s); Treatment for Depression or other mood problem   Have You Recently Had Any Thoughts About Hurting Yourself? No  Are You Planning to Commit Suicide/Harm Yourself At This time? No   Have you Recently Had Thoughts About Hurting Someone [name]? No  Are You Planning to Harm Someone at This Time? No  Explanation: Patient denies wanting to harm everyone.  However, per the IVC, his neighbor's reported patient made statements about shooting them with the crossbow.   Have You Used Any Alcohol or Drugs in the Past 24 Hours? No data recorded How Long Ago Did You Use Drugs or Alcohol? No data recorded What Did You Use and How Much? No data recorded  Do You Currently Have a Therapist/Psychiatrist? No data  recorded Name of Therapist/Psychiatrist: Did not answer   Have You Been Recently Discharged From Any Office Practice or Programs? No  Explanation of Discharge From Practice/Program: No data recorded   CCA Screening Triage Referral Assessment Type of Contact: Face-to-Face  Telemedicine Service Delivery:   Is this Initial or Reassessment?   Date Telepsych consult ordered in [facility]:    Time Telepsych consult ordered in [facility]:    Location of Assessment: [facility] ED  Provider Location: [facility] ED    Collateral Involvement: N/A   Does Patient Have a Court Appointed Legal Guardian? No data recorded Name and Contact of Legal Guardian: No data recorded If Minor and Not Living with Parent(s), Who has Custody? No data recorded Is CPS involved or ever been involved? Never  Is APS involved or ever been involved? Never   Patient Determined To Be At Risk for Harm To Self or Others Based on Review of Patient Reported Information or Presenting Complaint? Yes, for Harm to Others  Method: Plan with intent and identified person  Availability of Means: In hand or used  Intent: Intends to cause physical harm but not necessarily death  Notification Required: Identifiable person is aware  Additional Information for Danger to Others Potential: Active psychosis  Additional Comments for Danger to Others Potential: No data recorded Are There Guns or Other Weapons in Your Home? Yes  Types of Guns/Weapons: Crossbow  Are These Weapons Safely Secured?  Yes  Who Could Verify You Are Able To Have These Secured: LEO  Do You Have any Outstanding Charges, Pending Court Dates, Parole/Probation? None disclosed  Contacted To Inform of Risk of Harm To Self or Others: Law Enforcement   Does Patient Present under Involuntary Commitment? 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 Patient has been involuntarily committed on  .   [name], Counselor
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
## 2023-04-18 ENCOUNTER — Encounter: Payer: Self-pay | Admitting: Psychiatry

## 2023-04-18 ENCOUNTER — Ambulatory Visit: Payer: Medicare HMO | Admitting: Psychiatry

## 2023-04-18 VITALS — BP 104/72 | HR 101 | Temp 97.6°F | Ht 73.0 in | Wt 201.2 lb

## 2023-04-18 DIAGNOSIS — F1994 Other psychoactive substance use, unspecified with psychoactive substance-induced mood disorder: Secondary | ICD-10-CM | POA: Diagnosis not present

## 2023-04-18 DIAGNOSIS — G4700 Insomnia, unspecified: Secondary | ICD-10-CM | POA: Diagnosis not present

## 2023-04-18 DIAGNOSIS — F250 Schizoaffective disorder, bipolar type: Secondary | ICD-10-CM

## 2023-04-18 DIAGNOSIS — F1990 Other psychoactive substance use, unspecified, uncomplicated: Secondary | ICD-10-CM | POA: Diagnosis not present

## 2023-04-18 DIAGNOSIS — F411 Generalized anxiety disorder: Secondary | ICD-10-CM | POA: Diagnosis not present

## 2023-04-18 DIAGNOSIS — F1290 Cannabis use, unspecified, uncomplicated: Secondary | ICD-10-CM

## 2023-04-18 MED ORDER — CLONAZEPAM 0.5 MG PO TABS: 0.5000 mg | ORAL_TABLET | Freq: Two times a day (BID) | ORAL | 1 refills | Status: DC | PRN

## 2023-04-18 MED ORDER — OLANZAPINE 20 MG PO TABS: 20.0000 mg | ORAL_TABLET | Freq: Every day | ORAL | 3 refills | Status: DC

## 2023-04-18 MED ORDER — TRAZODONE HCL 150 MG PO TABS: 150.0000 mg | ORAL_TABLET | Freq: Every day | ORAL | 5 refills | Status: DC

## 2023-04-18 MED ORDER — DIVALPROEX SODIUM ER 500 MG PO TB24: 500.0000 mg | ORAL_TABLET | Freq: Every day | ORAL | 1 refills | Status: DC

## 2023-04-18 NOTE — Patient Instructions (Signed)
Continue olanzapine 20 mg at night   Continue Depakote ER 500 mg daily  Continue clonazepam 0.5 mg twice a day  Continue Trazodone 150 mg at night as needed for sleep  Next appointment- 3/31 at 4 pm  Referral to [facility] for ACTT

## 2023-04-25 ENCOUNTER — Other Ambulatory Visit: Payer: Self-pay | Admitting: Cardiology

## 2023-04-25 DIAGNOSIS — R0789 Other chest pain: Secondary | ICD-10-CM

## 2023-04-25 NOTE — Progress Notes (Signed)
Orders only for cardiac PET consent.   Gale Journey, PA-C

## 2023-04-26 ENCOUNTER — Telehealth (HOSPITAL_COMMUNITY): Payer: Self-pay | Admitting: *Deleted

## 2023-04-26 NOTE — Telephone Encounter (Signed)
Reaching out to patient to offer assistance regarding upcoming cardiac imaging study; pt's mother answered phone and verbalizes understanding of appt date/time, parking situation and where to check in, pre-test NPO status and verified current allergies; name and call back number provided for further questions should they arise  Larey Brick RN Navigator Cardiac Imaging [facility] [phone] office [phone] cell  Patient's mother verbalized understanding that the patient is to avoid caffeine 12 hours prior to his cardiac PET scan.

## 2023-04-27 ENCOUNTER — Ambulatory Visit
Admission: RE | Admit: 2023-04-27 | Discharge: 2023-04-27 | Disposition: A | Discharge status: Home / Self Care | Payer: Medicare HMO | Source: Ambulatory Visit | Attending: Cardiology | Admitting: Cardiology

## 2023-04-27 DIAGNOSIS — R0789 Other chest pain: Secondary | ICD-10-CM

## 2023-04-27 DIAGNOSIS — I2510 Atherosclerotic heart disease of native coronary artery without angina pectoris: Secondary | ICD-10-CM

## 2023-05-01 DIAGNOSIS — R0609 Other forms of dyspnea: Secondary | ICD-10-CM | POA: Diagnosis not present

## 2023-05-01 DIAGNOSIS — R0789 Other chest pain: Secondary | ICD-10-CM | POA: Diagnosis not present

## 2023-05-02 ENCOUNTER — Telehealth: Payer: Self-pay

## 2023-05-02 NOTE — Telephone Encounter (Signed)
called [facility] to follow up to see when Xai starts the ACTT . the guy i spoke with states that the pt would need to come to their location [address] Monday - Friday from 8-2 (FYI they only see 6 so he should be there early early to get to be seen to be assest)

## 2023-05-02 NOTE — Telephone Encounter (Signed)
called to give the address and time and other infomation but the Nathanal mom states that [name] is having a lot of heart testing done and she going to put that off until they finish with all these other testing.

## 2023-05-02 NOTE — Telephone Encounter (Signed)
 Noted, thank you

## 2023-05-03 ENCOUNTER — Telehealth (HOSPITAL_COMMUNITY): Payer: Self-pay | Admitting: Emergency Medicine

## 2023-05-03 NOTE — Telephone Encounter (Signed)
 Reaching out to patient to offer assistance regarding upcoming cardiac imaging study; pt verbalizes understanding of appt date/time, parking situation and where to check in, pre-test NPO status and medications ordered, and verified current allergies; name and call back number provided for further questions should they arise Rockwell Alexandria RN Navigator Cardiac Imaging [facility] [phone] office [phone] cell

## 2023-05-04 ENCOUNTER — Ambulatory Visit: Admission: RE | Admit: 2023-05-04 | Payer: Medicare HMO | Source: Ambulatory Visit

## 2023-05-16 DIAGNOSIS — H524 Presbyopia: Secondary | ICD-10-CM | POA: Diagnosis not present
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
## 2023-06-12 ENCOUNTER — Other Ambulatory Visit
Admission: RE | Admit: 2023-06-12 | Discharge: 2023-06-12 | Disposition: A | Discharge status: Home / Self Care | Attending: Psychiatry | Admitting: Psychiatry

## 2023-06-12 ENCOUNTER — Encounter: Payer: Self-pay | Admitting: Psychiatry

## 2023-06-12 ENCOUNTER — Ambulatory Visit: Payer: Medicare HMO | Admitting: Psychiatry

## 2023-06-12 VITALS — BP 136/78 | HR 91 | Temp 98.3°F | Ht 73.0 in | Wt 201.6 lb

## 2023-06-12 DIAGNOSIS — G4700 Insomnia, unspecified: Secondary | ICD-10-CM | POA: Diagnosis not present

## 2023-06-12 DIAGNOSIS — F250 Schizoaffective disorder, bipolar type: Secondary | ICD-10-CM | POA: Diagnosis not present

## 2023-06-12 DIAGNOSIS — F1990 Other psychoactive substance use, unspecified, uncomplicated: Secondary | ICD-10-CM

## 2023-06-12 DIAGNOSIS — F411 Generalized anxiety disorder: Secondary | ICD-10-CM

## 2023-06-12 DIAGNOSIS — Z79899 Other long term (current) drug therapy: Secondary | ICD-10-CM | POA: Insufficient documentation

## 2023-06-12 DIAGNOSIS — F1994 Other psychoactive substance use, unspecified with psychoactive substance-induced mood disorder: Secondary | ICD-10-CM

## 2023-06-12 DIAGNOSIS — F1290 Cannabis use, unspecified, uncomplicated: Secondary | ICD-10-CM | POA: Diagnosis not present

## 2023-06-12 LAB — CBC
HCT: 33.9 % — ABNORMAL LOW (ref 39.0–52.0)
Hemoglobin: 11.2 g/dL — ABNORMAL LOW (ref 13.0–17.0)
MCH: 28 pg (ref 26.0–34.0)
MCHC: 33 g/dL (ref 30.0–36.0)
MCV: 84.8 fL (ref 80.0–100.0)
Platelets: 242 10*3/uL (ref 150–400)
RBC: 4 MIL/uL — ABNORMAL LOW (ref 4.22–5.81)
RDW: 13.1 % (ref 11.5–15.5)
WBC: 8.4 10*3/uL (ref 4.0–10.5)
nRBC: 0 % (ref 0.0–0.2)

## 2023-06-12 LAB — COMPREHENSIVE METABOLIC PANEL WITH GFR
ALT: 12 U/L (ref 0–44)
AST: 17 U/L (ref 15–41)
Albumin: 4 g/dL (ref 3.5–5.0)
Alkaline Phosphatase: 67 U/L (ref 38–126)
Anion gap: 9 (ref 5–15)
BUN: 5 mg/dL — ABNORMAL LOW (ref 6–20)
CO2: 26 mmol/L (ref 22–32)
Calcium: 8.9 mg/dL (ref 8.9–10.3)
Chloride: 105 mmol/L (ref 98–111)
Creatinine, Ser: 0.78 mg/dL (ref 0.61–1.24)
GFR, Estimated: >60 mL/min (ref >60)
Glucose, Bld: 76 mg/dL (ref 70–99)
Potassium: 3.7 mmol/L (ref 3.5–5.1)
Sodium: 140 mmol/L (ref 135–145)
Total Bilirubin: 0.2 mg/dL (ref 0.0–1.2)
Total Protein: 6.9 g/dL (ref 6.5–8.1)

## 2023-06-12 LAB — VALPROIC ACID LEVEL: Valproic Acid Lvl: 30 ug/mL — ABNORMAL LOW (ref 50.0–100.0)

## 2023-06-12 MED ORDER — DIVALPROEX SODIUM ER 500 MG PO TB24: 500.0000 mg | ORAL_TABLET | Freq: Every day | ORAL | 1 refills | Status: DC

## 2023-06-12 MED ORDER — CLONAZEPAM 0.5 MG PO TABS: 0.5000 mg | ORAL_TABLET | Freq: Two times a day (BID) | ORAL | 1 refills | Status: DC | PRN

## 2023-06-12 NOTE — Patient Instructions (Signed)
 Continue olanzapine 20 mg at night   Continue Depakote ER 500 mg daily Continue clonazepam 0.5 mg twice a day  Continue Trazodone 150 mg at night as needed for sleep  Obtain labs (CBC, CMP, VPA) Next appointment- 5/8 at 2:30 Please go to [facility] for evaluation (ACTT)  [address]  Monday - Friday from 8-2

## 2023-06-13 ENCOUNTER — Other Ambulatory Visit: Payer: Self-pay | Admitting: Psychiatry

## 2023-06-13 ENCOUNTER — Telehealth: Payer: Self-pay

## 2023-06-13 NOTE — Progress Notes (Signed)
 Please advise him that the test indicates anemia, which is consistent with previous blood test results. I recommend that he continue follow-up with his primary care provider for further evaluation and management.  Regarding Depakote, the current level is subtherapeutic; however, I recommend continuing the same dose for now, given that he is also on lamotrigine-unless he has specific concerns.

## 2023-06-13 NOTE — Telephone Encounter (Signed)
 left message ask for a return call back to go over the labwork

## 2023-06-13 NOTE — Telephone Encounter (Signed)
-----   Message from [place] sent at 06/13/2023  8:57 AM EDT ----- Please advise him that the test indicates anemia, which is consistent with previous blood test results. I recommend that he continue follow-up with his primary care provider for further evaluation and management.  Regarding Depakote, the current level is subtherapeutic; however, I recommend continuing the same dose for now, given that he is also on lamotrigine-unless he has specific concerns.

## 2023-06-14 ENCOUNTER — Telehealth (HOSPITAL_COMMUNITY): Payer: Self-pay | Admitting: *Deleted

## 2023-06-14 NOTE — Telephone Encounter (Signed)
pt mother was notified.

## 2023-06-14 NOTE — Telephone Encounter (Signed)
 Reaching out to patient to offer assistance regarding upcoming cardiac imaging study; pt's mother answered phone and verbalizes understanding of appt date/time, parking situation and where to check in, pre-test NPO status; name and call back number provided for further questions should they arise  Larey Brick RN Navigator Cardiac Imaging [facility] [phone] office [phone] cell  Patient's mother is aware the patient is to avoid caffeine 12 hours prior to his cardiac PET scan.

## 2023-06-15 ENCOUNTER — Ambulatory Visit
Admission: RE | Admit: 2023-06-15 | Discharge: 2023-06-15 | Disposition: A | Discharge status: Home / Self Care | Payer: Medicare HMO | Source: Ambulatory Visit | Attending: Cardiology | Admitting: Cardiology

## 2023-06-15 DIAGNOSIS — R918 Other nonspecific abnormal finding of lung field: Secondary | ICD-10-CM | POA: Diagnosis not present

## 2023-06-15 DIAGNOSIS — K449 Diaphragmatic hernia without obstruction or gangrene: Secondary | ICD-10-CM | POA: Diagnosis not present

## 2023-06-15 DIAGNOSIS — I2510 Atherosclerotic heart disease of native coronary artery without angina pectoris: Secondary | ICD-10-CM | POA: Diagnosis not present

## 2023-06-15 DIAGNOSIS — R0789 Other chest pain: Secondary | ICD-10-CM | POA: Diagnosis not present

## 2023-06-15 DIAGNOSIS — I700 Atherosclerosis of aorta: Secondary | ICD-10-CM | POA: Insufficient documentation

## 2023-06-15 LAB — NM PET CT CARDIAC PERFUSION MULTI W/ABSOLUTE BLOODFLOW
LV dias vol: 112 mL (ref 62–150)
LV sys vol: 41 mL
MBFR: 1.21
Nuc Rest EF: 59 %
Nuc Stress EF: 63 %
Peak HR: 64 {beats}/min
Rest HR: 62 {beats}/min
Rest MBF: 0.75 ml/g/min
Rest Nuclear Isotope Dose: 24 mCi
SRS: 0
SSS: 0
ST Depression (mm): 0 mm
Stress MBF: 0.91 ml/g/min
Stress Nuclear Isotope Dose: 23.6 mCi
TID: 1

## 2023-06-15 MED ORDER — REGADENOSON 0.4 MG/5ML IV SOLN
INTRAVENOUS | Status: AC
  Filled 2023-06-15: qty 5

## 2023-06-15 MED ORDER — REGADENOSON 0.4 MG/5ML IV SOLN
0.4000 mg | Freq: Once | INTRAVENOUS | Status: AC
  Administered 2023-06-15: 0.4 mg via INTRAVENOUS
  Filled 2023-06-15: qty 5

## 2023-06-15 MED ORDER — RUBIDIUM RB82 GENERATOR (RUBYFILL)
25.0000 | PACK | Freq: Once | INTRAVENOUS | Status: AC
  Administered 2023-06-15: 23.64 via INTRAVENOUS

## 2023-06-15 MED ORDER — RUBIDIUM RB82 GENERATOR (RUBYFILL)
25.0000 | PACK | Freq: Once | INTRAVENOUS | Status: AC
  Administered 2023-06-15: 24 via INTRAVENOUS

## 2023-06-15 NOTE — Progress Notes (Signed)
 Patient presents for a cardiac PET stress test and tolerated procedure without incident. Patient maintained acceptable vital signs throughout the test and was offered caffeine after test.  Patient ambulated out of department with a steady gait.

## 2023-06-20 DIAGNOSIS — H524 Presbyopia: Secondary | ICD-10-CM | POA: Diagnosis not present

## 2023-06-22 DIAGNOSIS — I2510 Atherosclerotic heart disease of native coronary artery without angina pectoris: Secondary | ICD-10-CM | POA: Diagnosis not present

## 2023-06-22 DIAGNOSIS — R0609 Other forms of dyspnea: Secondary | ICD-10-CM | POA: Diagnosis not present

## 2023-06-22 DIAGNOSIS — R600 Localized edema: Secondary | ICD-10-CM | POA: Diagnosis not present

## 2023-06-22 DIAGNOSIS — I10 Essential (primary) hypertension: Secondary | ICD-10-CM | POA: Diagnosis not present

## 2023-06-22 DIAGNOSIS — F17200 Nicotine dependence, unspecified, uncomplicated: Secondary | ICD-10-CM | POA: Diagnosis not present

## 2023-06-22 DIAGNOSIS — J449 Chronic obstructive pulmonary disease, unspecified: Secondary | ICD-10-CM | POA: Diagnosis not present
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
## 2023-07-20 ENCOUNTER — Encounter: Payer: Self-pay | Admitting: Psychiatry

## 2023-07-20 ENCOUNTER — Ambulatory Visit: Admitting: Psychiatry

## 2023-07-20 ENCOUNTER — Other Ambulatory Visit: Payer: Self-pay

## 2023-07-20 VITALS — BP 111/74 | HR 66 | Temp 97.7°F | Ht 73.0 in | Wt 202.1 lb

## 2023-07-20 DIAGNOSIS — F411 Generalized anxiety disorder: Secondary | ICD-10-CM

## 2023-07-20 DIAGNOSIS — F250 Schizoaffective disorder, bipolar type: Secondary | ICD-10-CM

## 2023-07-20 DIAGNOSIS — G4700 Insomnia, unspecified: Secondary | ICD-10-CM | POA: Diagnosis not present

## 2023-07-20 DIAGNOSIS — F1994 Other psychoactive substance use, unspecified with psychoactive substance-induced mood disorder: Secondary | ICD-10-CM | POA: Diagnosis not present

## 2023-07-20 MED ORDER — DIVALPROEX SODIUM ER 500 MG PO TB24: 500.0000 mg | ORAL_TABLET | Freq: Every day | ORAL | 3 refills | Status: DC

## 2023-07-20 MED ORDER — OLANZAPINE 20 MG PO TABS: 20.0000 mg | ORAL_TABLET | Freq: Every day | ORAL | 3 refills | Status: DC

## 2023-07-20 MED ORDER — CLONAZEPAM 0.5 MG PO TABS: 0.5000 mg | ORAL_TABLET | Freq: Two times a day (BID) | ORAL | 0 refills | Status: DC | PRN

## 2023-07-20 NOTE — Patient Instructions (Signed)
 Continue olanzapine  20 mg at night   Continue Depakote  ER 500 mg daily  Continue clonazepam  0.5 mg twice a day  Continue Trazodone  150 mg at night as needed for sleep  Next appointment- 6/26 at 3:30

## 2023-07-25 DIAGNOSIS — I2510 Atherosclerotic heart disease of native coronary artery without angina pectoris: Secondary | ICD-10-CM | POA: Diagnosis not present

## 2023-07-25 DIAGNOSIS — J301 Allergic rhinitis due to pollen: Secondary | ICD-10-CM | POA: Diagnosis not present

## 2023-07-25 DIAGNOSIS — K219 Gastro-esophageal reflux disease without esophagitis: Secondary | ICD-10-CM | POA: Diagnosis not present

## 2023-07-25 DIAGNOSIS — Z87891 Personal history of nicotine dependence: Secondary | ICD-10-CM | POA: Diagnosis not present

## 2023-07-25 DIAGNOSIS — Z748 Other problems related to care provider dependency: Secondary | ICD-10-CM | POA: Diagnosis not present

## 2023-07-25 DIAGNOSIS — J449 Chronic obstructive pulmonary disease, unspecified: Secondary | ICD-10-CM | POA: Diagnosis not present

## 2023-08-10 DIAGNOSIS — E782 Mixed hyperlipidemia: Secondary | ICD-10-CM | POA: Diagnosis not present

## 2023-08-10 DIAGNOSIS — F17200 Nicotine dependence, unspecified, uncomplicated: Secondary | ICD-10-CM | POA: Diagnosis not present

## 2023-08-10 DIAGNOSIS — J449 Chronic obstructive pulmonary disease, unspecified: Secondary | ICD-10-CM | POA: Diagnosis not present

## 2023-08-10 DIAGNOSIS — I10 Essential (primary) hypertension: Secondary | ICD-10-CM | POA: Diagnosis not present

## 2023-08-10 DIAGNOSIS — I2510 Atherosclerotic heart disease of native coronary artery without angina pectoris: Secondary | ICD-10-CM | POA: Diagnosis not present

## 2023-08-22 DIAGNOSIS — Z862 Personal history of diseases of the blood and blood-forming organs and certain disorders involving the immune mechanism: Secondary | ICD-10-CM | POA: Diagnosis not present

## 2023-08-22 DIAGNOSIS — I10 Essential (primary) hypertension: Secondary | ICD-10-CM | POA: Diagnosis not present

## 2023-08-22 DIAGNOSIS — R7303 Prediabetes: Secondary | ICD-10-CM | POA: Diagnosis not present
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
## 2023-09-06 ENCOUNTER — Other Ambulatory Visit: Payer: Self-pay | Admitting: Psychiatry

## 2023-09-06 DIAGNOSIS — F3132 Bipolar disorder, current episode depressed, moderate: Secondary | ICD-10-CM

## 2023-09-07 ENCOUNTER — Ambulatory Visit (INDEPENDENT_AMBULATORY_CARE_PROVIDER_SITE_OTHER): Admitting: Psychiatry

## 2023-09-07 ENCOUNTER — Other Ambulatory Visit: Payer: Self-pay

## 2023-09-07 ENCOUNTER — Encounter: Payer: Self-pay | Admitting: Psychiatry

## 2023-09-07 VITALS — BP 100/61 | HR 77 | Temp 96.2°F | Ht 73.0 in | Wt 208.6 lb

## 2023-09-07 DIAGNOSIS — F1290 Cannabis use, unspecified, uncomplicated: Secondary | ICD-10-CM

## 2023-09-07 DIAGNOSIS — Z79899 Other long term (current) drug therapy: Secondary | ICD-10-CM

## 2023-09-07 DIAGNOSIS — G4700 Insomnia, unspecified: Secondary | ICD-10-CM | POA: Diagnosis not present

## 2023-09-07 DIAGNOSIS — F411 Generalized anxiety disorder: Secondary | ICD-10-CM | POA: Diagnosis not present

## 2023-09-07 DIAGNOSIS — F1994 Other psychoactive substance use, unspecified with psychoactive substance-induced mood disorder: Secondary | ICD-10-CM | POA: Diagnosis not present

## 2023-09-07 DIAGNOSIS — F1990 Other psychoactive substance use, unspecified, uncomplicated: Secondary | ICD-10-CM | POA: Diagnosis not present

## 2023-09-07 DIAGNOSIS — F250 Schizoaffective disorder, bipolar type: Secondary | ICD-10-CM | POA: Diagnosis not present

## 2023-09-07 MED ORDER — CLONAZEPAM 0.5 MG PO TABS: 0.5000 mg | ORAL_TABLET | Freq: Two times a day (BID) | ORAL | 0 refills | Status: DC | PRN

## 2023-09-07 NOTE — Patient Instructions (Signed)
 Continue olanzapine  20 mg at night   Continue Depakote  ER 500 mg daily Continue clonazepam  0.5 mg twice a day  Continue Trazodone  150 mg at night as needed for sleep  Obtain labs (VPA) Next appointment- 7/31 at 4:00

## 2023-09-20 ENCOUNTER — Other Ambulatory Visit: Payer: Self-pay | Admitting: Psychiatry

## 2023-09-20 DIAGNOSIS — F3132 Bipolar disorder, current episode depressed, moderate: Secondary | ICD-10-CM

## 2023-10-03 ENCOUNTER — Telehealth: Payer: Self-pay | Admitting: Psychiatry

## 2023-10-03 NOTE — Telephone Encounter (Signed)
 Could you ask which medication they need?

## 2023-10-03 NOTE — Telephone Encounter (Signed)
 Called to clarify about the medication patient is looking for the Luvox  which looks like was discontinued made patient aware that I would send a message to the provider and let him know please advise

## 2023-10-03 NOTE — Telephone Encounter (Signed)
 When we spoke with the pharmacy, they confirmed that the medication has not been filled since December 2024. However, it appears he has been doing well since then. Please advise to hold the medication at this time.

## 2023-10-03 NOTE — Telephone Encounter (Signed)
 Spoke to mother of the patient informed her that the medication has not been filled since 12/24 she stated that she did not know about this but she voiced understanding after letting her know that per the  provider to advise the patient to hold the medication at this time
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
## 2023-10-12 ENCOUNTER — Ambulatory Visit: Admitting: Psychiatry

## 2023-10-20 ENCOUNTER — Other Ambulatory Visit: Payer: Self-pay | Admitting: Psychiatry

## 2023-10-30 DIAGNOSIS — Z87820 Personal history of traumatic brain injury: Secondary | ICD-10-CM | POA: Diagnosis not present

## 2023-10-30 DIAGNOSIS — R4189 Other symptoms and signs involving cognitive functions and awareness: Secondary | ICD-10-CM | POA: Diagnosis not present

## 2023-10-30 DIAGNOSIS — R569 Unspecified convulsions: Secondary | ICD-10-CM | POA: Diagnosis not present

## 2023-10-30 DIAGNOSIS — G44329 Chronic post-traumatic headache, not intractable: Secondary | ICD-10-CM | POA: Diagnosis not present
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
## 2023-11-02 DIAGNOSIS — Z862 Personal history of diseases of the blood and blood-forming organs and certain disorders involving the immune mechanism: Secondary | ICD-10-CM | POA: Diagnosis not present

## 2023-11-02 DIAGNOSIS — F17210 Nicotine dependence, cigarettes, uncomplicated: Secondary | ICD-10-CM | POA: Diagnosis not present

## 2023-11-02 DIAGNOSIS — R7303 Prediabetes: Secondary | ICD-10-CM | POA: Diagnosis not present

## 2023-11-02 DIAGNOSIS — I2510 Atherosclerotic heart disease of native coronary artery without angina pectoris: Secondary | ICD-10-CM | POA: Diagnosis not present

## 2023-11-02 DIAGNOSIS — I700 Atherosclerosis of aorta: Secondary | ICD-10-CM | POA: Diagnosis not present

## 2023-11-02 DIAGNOSIS — I10 Essential (primary) hypertension: Secondary | ICD-10-CM | POA: Diagnosis not present

## 2023-11-02 NOTE — Progress Notes (Signed)
 Chief Complaint  Patient presents with  . Follow-up    HPI  [name] is a 53 y.o. here for follow up of chronic medical issues  HTN: No acute issues.  Tolerating meds without adverse effects.  Not checking home blood pressures.  Denies HAs, vision changes, dizziness, cp, or syncopal episodes.  Borderline DM: No acute issues.  No meds.  Asymptomatic.  Not checking CBGs.  CAD: No acute chest pain.  Tolerating meds.  Aortic atherosclerosis: No acute issues.  Asymptomatic.  Remains on aspirin and statin therapy without adverse effects.  Tobacco dependence: Smoking 1/4 packs/day.  No complaints at this time.  Normocytic anemia: No acute issues.  Asymptomatic.  No bleeding.  ROS Review of systems is unremarkable for any active cardiac, respiratory, GI, GU, hematologic, neurologic, dermatologic, HEENT, or psychiatric symptoms except as noted above.  No fevers, chills, or constitutional symptoms.   Current Outpatient Medications  Medication Sig Dispense Refill  . albuterol  (ACCUNEB ) 0.63 mg/3 mL nebulizer solution Take 0.63 mg by nebulization every 6 (six) hours as needed for Wheezing Unsure if this is the correct medication    . albuterol  MDI, PROVENTIL , VENTOLIN , PROAIR , HFA 90 mcg/actuation inhaler Inhale 2 inhalations into the lungs every 6 (six) hours as needed for Wheezing 54 g 1  . aspirin 81 MG EC tablet Take 1 tablet (81 mg total) by mouth once daily    . clonazePAM  (KLONOPIN ) 0.5 MG tablet Take 0.5 mg by mouth 2 (two) times daily as needed       . cyanocobalamin  (VITAMIN B12) 1000 MCG tablet Take 1,000 mcg by mouth once daily as needed    . divalproex  (DEPAKOTE ) 250 MG DR tablet Take 250 mg by mouth every 12 (twelve) hours    . DOCUSATE CALCIUM (STOOL SOFTENER ORAL) Take 1 capsule by mouth 2 (two) times daily as needed.    . fluticasone  furoate-vilanteroL (BREO ELLIPTA ) 100-25 mcg/dose DsDv inhaler INHALE 1 PUFF INTO THE LUNGS ONCE DAILY 60 each 6  . hydroCHLOROthiazide  (HYDRODIURIL) 25 MG tablet Take 1 tablet (25 mg total) by mouth once daily 30 tablet 11  . lamoTRIgine  (LAMICTAL  XR) 200 mg XR tablet Take 1 tablet (200 mg total) by mouth once daily 90 tablet 3  . lisinopriL  (ZESTRIL ) 10 MG tablet TAKE 1 TABLET BY MOUTH DAILY 90 tablet 1  . meloxicam (MOBIC) 15 MG tablet Take 1 tablet (15 mg total) by mouth daily with breakfast (Patient taking differently: Take 15 mg by mouth once daily as needed) 21 tablet 0  . pantoprazole  (PROTONIX ) 40 MG DR tablet Take 1 tablet (40 mg total) by mouth once daily 90 tablet 1  . rosuvastatin (CRESTOR) 10 MG tablet Take 1 tablet (10 mg total) by mouth once daily 30 tablet 11  . traZODone  (DESYREL ) 150 MG tablet Take 150 mg by mouth as directed Take Trazodone  150mg  at bedtime + Trazodone  50mg  at bedtime to equal Trazodone  200mg  at bedtime      No current facility-administered medications for this visit.    Allergies as of 11/02/2023 - Reviewed 11/02/2023  Allergen Reaction Noted  . Amitriptyline Other (See Comments) 04/14/2015    Patient Active Problem List  Diagnosis  . Moderate episode of recurrent major depressive disorder (CMS-HCC) - followed by Dr. Vickey  . Anxiety, unspecified - followed by Dr. Vickey  . Essential hypertension  . Seizures (CMS-HCC) - followed by Dr. Maree  . Chronic insomnia - followed by Dr. Vickey  . Cognitive deficit as late effect  of traumatic brain injury ()  . Borderline diabetes mellitus (A1c 6.1% - 08/22/23) - diet controlled  . History of normocytic normochromic anemia (Hgb 12.4 -08/22/23)  . Medicare annual wellness visit, initial 12/07/17  . Bipolar I disorder, current or most recent episode depressed, with psychotic features (CMS-HCC) - followed by Dr. Vickey  . Tobacco dependence (<1/4ppd)  . GERD (gastroesophageal reflux disease)  . Medicare annual wellness visit, subsequent 02/12/21  . COPD, mild , unspecified (CMS-HCC) - followed by Dr. Theotis  . History of colon polyps (12/24/18 -  repeat 5 yrs per Dr. Aundria)  . Gastroesophageal reflux disease without esophagitis  . Amphetamine abuse (CMS/HHS-HCC)  . Coronary artery disease involving native coronary artery of native heart without angina pectoris (3 vessel dz - CT chest 02/20/23)  . Aortic atherosclerosis (CT chest 02/20/23)  . Other emphysema (Chest CT 02/20/23) - followed by Dr. Theotis  . Psychiatric illness    Past Medical History:  Diagnosis Date  . Anxiety   . Bright red rectal bleeding 04/24/2018  . Dementia (CMS/HHS-HCC)   . Depression   . GERD (gastroesophageal reflux disease)   . HTN (hypertension)   . Hyperlipidemia   . Migraine   . Poor historian   . Pulmonary emphysema (CMS/HHS-HCC)   . Pulmonary nodules   . Seizures (CMS/HHS-HCC)   . Subdural hematoma (CMS/HHS-HCC)   . Ulcer     Past Surgical History:  Procedure Laterality Date  . LAPAROSCOPIC CHOLECYSTECTOMY  04/25/2018  . COLONOSCOPY  12/24/2018   Tubular adenoma of the colon/Sessile Serrated adenoma CBF 12/2023  . EGD  12/24/2018   Gastritis No repeat per [facility]  . CRANIOPLASTY    . CRANIOTOMY      Vitals:   11/02/23 1311  BP: 130/78  Pulse: 82  SpO2: 97%  Weight: 92.4 kg (203 lb 12.8 oz)  Height: 185.4 cm (6' 1)  PainSc: 0-No pain   Body mass index is 26.89 kg/m.  Exam  General. Well appearing; NAD; VS reviewed     Eyes. Sclera and conjunctiva clear; Vision grossly intact; extraocular movements intact Neck. Supple.  Lungs. Respirations unlabored; clear to auscultation bilaterally Cardiovascular. Heart regular rate and rhythm without murmurs, gallops, or rubs Abdomen. Soft; non tender; non distended; no masses or organomegaly Extremities. no edema Skin. Normal color and turgor Neurologic. Alert and oriented x3; CN 2-12 grossly intact; no focal deficits  Assessment and Plan  1. Essential hypertension Well controlled.  Cr and electrolytes wnls (08/2023).  No changes to current regimen.  Counseled on low salt diet and  aerobic exercise 5x/wk.  Advised to monitor bp 2x/wk. -     Comprehensive Metabolic Panel (CMP); Future  2. Borderline diabetes mellitus (A1c 6.1% - 08/22/23) - diet controlled Stable off meds.  Focus on lifestyle modifications.  No need to check CBGs. -     Hemoglobin A1C; Future  3. Coronary artery disease involving native coronary artery of native heart without angina pectoris (3 vessel dz - CT chest 02/20/23) Stable.  Asymptomatic.  Continue with aspirin and statin therapy.  Ideally focus on Mediterranean diet and exercise. -     Lipid Panel w/calc LDL; Future  4. Aortic atherosclerosis (CT chest 02/20/23) Stable.  Same as #3. -     Lipid Panel w/calc LDL; Future  5. History of normocytic normochromic anemia (Hgb 12.4 -08/22/23) Likely worsened.  Asymptomatic.  Monitor for now. -     CBC w/auto Differential (5 Part); Future  6. Tobacco dependence (<1/4ppd) Unchanged.  Smoking cessation counseling provided.  Not ready quit at this time.  Other orders -     Follow up in Primary Care; Future  F/u 6 months for PE.  Labs prior  ALDA CARPEN, MD
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
## 2023-11-20 ENCOUNTER — Other Ambulatory Visit
Admission: RE | Admit: 2023-11-20 | Discharge: 2023-11-20 | Disposition: A | Discharge status: Home / Self Care | Source: Ambulatory Visit | Attending: Psychiatry | Admitting: Psychiatry

## 2023-11-20 ENCOUNTER — Encounter: Payer: Self-pay | Admitting: Psychiatry

## 2023-11-20 ENCOUNTER — Ambulatory Visit: Admitting: Psychiatry

## 2023-11-20 VITALS — BP 118/82 | HR 80 | Temp 98.0°F | Ht 73.0 in | Wt 206.6 lb

## 2023-11-20 DIAGNOSIS — F411 Generalized anxiety disorder: Secondary | ICD-10-CM | POA: Diagnosis not present

## 2023-11-20 DIAGNOSIS — F1994 Other psychoactive substance use, unspecified with psychoactive substance-induced mood disorder: Secondary | ICD-10-CM | POA: Diagnosis not present

## 2023-11-20 DIAGNOSIS — F250 Schizoaffective disorder, bipolar type: Secondary | ICD-10-CM | POA: Diagnosis not present

## 2023-11-20 DIAGNOSIS — G4700 Insomnia, unspecified: Secondary | ICD-10-CM

## 2023-11-20 DIAGNOSIS — Z79899 Other long term (current) drug therapy: Secondary | ICD-10-CM

## 2023-11-20 LAB — VALPROIC ACID LEVEL: Valproic Acid Lvl: <10 ug/mL — ABNORMAL LOW (ref 50–100)

## 2023-11-20 LAB — HEPATIC FUNCTION PANEL
ALT: 9 U/L (ref 0–44)
AST: 15 U/L (ref 15–41)
Albumin: 3.9 g/dL (ref 3.5–5.0)
Alkaline Phosphatase: 58 U/L (ref 38–126)
Bilirubin, Direct: <0.1 mg/dL (ref 0.0–0.2)
Total Bilirubin: 0.3 mg/dL (ref 0.0–1.2)
Total Protein: 6.7 g/dL (ref 6.5–8.1)

## 2023-11-20 LAB — CBC
HCT: 30.1 % — ABNORMAL LOW (ref 39.0–52.0)
Hemoglobin: 10.3 g/dL — ABNORMAL LOW (ref 13.0–17.0)
MCH: 28.5 pg (ref 26.0–34.0)
MCHC: 34.2 g/dL (ref 30.0–36.0)
MCV: 83.4 fL (ref 80.0–100.0)
Platelets: 283 K/uL (ref 150–400)
RBC: 3.61 MIL/uL — ABNORMAL LOW (ref 4.22–5.81)
RDW: 13.7 % (ref 11.5–15.5)
WBC: 12.5 K/uL — ABNORMAL HIGH (ref 4.0–10.5)
nRBC: 0 % (ref 0.0–0.2)

## 2023-11-20 NOTE — Patient Instructions (Signed)
 Continue olanzapine  20 mg at night   Continue Depakote  ER 500 mg daily Continue clonazepam  0.5 mg twice a day  Continue Trazodone  150 mg at night as needed for sleep  Obtain labs (VPA, CBC, LFT) Next appointment-  10/30 at 4 pm

## 2023-11-21 ENCOUNTER — Ambulatory Visit: Payer: Self-pay | Admitting: Psychiatry

## 2023-11-21 NOTE — Progress Notes (Signed)
 Please advise him that he continues to have anemia, which has slightly worsened compared to several months ago. I recommend that he follow up with his primary care provider for further evaluation if needed.  Depakote  level is in the lower therapeutic range. Please encourage him to take it consistently every day. We will continue with the current dose at this time.

## 2023-11-21 NOTE — Telephone Encounter (Signed)
 Patient returned call to office inform patient of the results and the message from the provider he voiced understanding

## 2023-11-21 NOTE — Progress Notes (Signed)
 Called to speak to patient about his anemia and depakote  he was not available left a message for patient to return call to the office

## 2023-11-28 DIAGNOSIS — J449 Chronic obstructive pulmonary disease, unspecified: Secondary | ICD-10-CM | POA: Diagnosis not present

## 2023-11-28 DIAGNOSIS — F17210 Nicotine dependence, cigarettes, uncomplicated: Secondary | ICD-10-CM | POA: Diagnosis not present

## 2023-11-28 DIAGNOSIS — J301 Allergic rhinitis due to pollen: Secondary | ICD-10-CM | POA: Diagnosis not present

## 2023-12-21 ENCOUNTER — Other Ambulatory Visit (HOSPITAL_COMMUNITY): Payer: Self-pay | Admitting: Psychiatry

## 2023-12-21 ENCOUNTER — Telehealth: Payer: Self-pay

## 2023-12-21 MED ORDER — CLONAZEPAM 0.5 MG PO TABS: 0.5000 mg | ORAL_TABLET | Freq: Two times a day (BID) | ORAL | 1 refills | Status: DC | PRN

## 2023-12-21 NOTE — Telephone Encounter (Signed)
 Mother of patient called to request a refill of  clonazePAM  (KLONOPIN ) 0.5 MG tablet      Last visit 11-20-23 Next visit 01-11-24     Preferred Pharmacies   [facility] - [place], [place] - [address] Phone: [phone]  Fax: [phone]

## 2023-12-21 NOTE — Telephone Encounter (Signed)
 sent
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
## 2024-01-11 ENCOUNTER — Ambulatory Visit: Admitting: Psychiatry

## 2024-01-24 ENCOUNTER — Ambulatory Visit: Payer: Self-pay

## 2024-01-24 NOTE — Telephone Encounter (Signed)
 FYI Only or Action Required?: FYI only for provider: ED advised.  Called Nurse Triage reporting Dental Pain.  Symptoms began ongoing problem for a while.  Interventions attempted: Other: has been evaluated by dentists.  Symptoms are: gradually worsening.  Triage Disposition: See HCP Within 4 Hours (Or PCP Triage)  Patient/caregiver understands and will follow disposition?: Yes, going to the ED      Copied from CRM #[id number]. Topic: Clinical - Red Word Triage >> Jan 24, 2024 12:55 PM Carrielelia G wrote: [facility] that prompted transfer to Nurse Triage: severe dental pain, 2 days        Reason for Disposition  [1] SEVERE pain (e.g., excruciating, unable to eat, unable to do any normal activities) AND [2] not improved 2 hours after pain medicine  Answer Assessment - Initial Assessment Questions 1. LOCATION: Which tooth is hurting?  (e.g., right-side/left-side, upper/lower, front/back)     Left lower  2. ONSET: When did the toothache start?  (e.g., hours, days)      A while 3. SEVERITY: How bad is the toothache?  (Scale 1-10; mild, moderate or severe)     Moderate to severe  4. SWELLING: Is there any visible swelling of your face?     Yes, swelling is present  5. OTHER SYMPTOMS: Do you have any other symptoms? (e.g., fever)     No  Protocols used: Toothache-A-AH
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
## 2024-02-05 ENCOUNTER — Ambulatory Visit (INDEPENDENT_AMBULATORY_CARE_PROVIDER_SITE_OTHER): Admitting: Psychiatry

## 2024-02-05 ENCOUNTER — Encounter: Payer: Self-pay | Admitting: Psychiatry

## 2024-02-05 ENCOUNTER — Other Ambulatory Visit: Payer: Self-pay

## 2024-02-05 VITALS — BP 118/77 | HR 78 | Temp 97.8°F | Ht 73.0 in | Wt 195.0 lb

## 2024-02-05 DIAGNOSIS — F250 Schizoaffective disorder, bipolar type: Secondary | ICD-10-CM | POA: Diagnosis not present

## 2024-02-05 DIAGNOSIS — F411 Generalized anxiety disorder: Secondary | ICD-10-CM | POA: Diagnosis not present

## 2024-02-05 DIAGNOSIS — F1994 Other psychoactive substance use, unspecified with psychoactive substance-induced mood disorder: Secondary | ICD-10-CM | POA: Diagnosis not present

## 2024-02-05 MED ORDER — DIVALPROEX SODIUM ER 500 MG PO TB24: 500.0000 mg | ORAL_TABLET | Freq: Every day | ORAL | 3 refills | Status: AC

## 2024-02-05 MED ORDER — CLONAZEPAM 0.5 MG PO TABS: 0.5000 mg | ORAL_TABLET | Freq: Two times a day (BID) | ORAL | 1 refills | Status: DC | PRN

## 2024-02-05 MED ORDER — OLANZAPINE 20 MG PO TABS: 20.0000 mg | ORAL_TABLET | Freq: Every day | ORAL | 3 refills | Status: AC

## 2024-02-05 MED ORDER — TRAZODONE HCL 150 MG PO TABS: 150.0000 mg | ORAL_TABLET | Freq: Every day | ORAL | 5 refills | Status: AC

## 2024-02-09 ENCOUNTER — Other Ambulatory Visit: Payer: Self-pay | Admitting: Specialist

## 2024-02-09 DIAGNOSIS — J449 Chronic obstructive pulmonary disease, unspecified: Secondary | ICD-10-CM

## 2024-02-09 DIAGNOSIS — F17210 Nicotine dependence, cigarettes, uncomplicated: Secondary | ICD-10-CM

## 2024-03-05 ENCOUNTER — Ambulatory Visit
Admission: RE | Admit: 2024-03-05 | Discharge: 2024-03-05 | Disposition: A | Discharge status: Home / Self Care | Source: Ambulatory Visit | Attending: Specialist | Admitting: Specialist

## 2024-03-05 ENCOUNTER — Ambulatory Visit

## 2024-03-05 DIAGNOSIS — F17210 Nicotine dependence, cigarettes, uncomplicated: Secondary | ICD-10-CM | POA: Insufficient documentation

## 2024-03-05 DIAGNOSIS — J449 Chronic obstructive pulmonary disease, unspecified: Secondary | ICD-10-CM | POA: Diagnosis present
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
## 2024-03-28 NOTE — Progress Notes (Signed)
 " Follow-up and COPD   History of Present Illness: [name] is a 54 y.o. male who presents to clinic for recheck. No new complaints. Still smoking. Discuessed at length to need to stop smoking and went over the reccent chest ct ( LDCT) EXAM:  CT CHEST WITHOUT CONTRAST LOW-DOSE FOR LUNG CANCER SCREENING   TECHNIQUE:  Multidetector CT imaging of the chest was performed following the  standard protocol without IV contrast.   RADIATION DOSE REDUCTION: This exam was performed according to the  departmental dose-optimization program which includes automated  exposure control, adjustment of the mA and/or kV according to  patient size and/or use of iterative reconstruction technique.   COMPARISON:  02/20/2023 screening chest CT   FINDINGS:  Cardiovascular: Normal heart size. No significant pericardial  effusion/thickening. Three-vessel coronary atherosclerosis.  Atherosclerotic nonaneurysmal thoracic aorta. Normal caliber  pulmonary arteries.   Mediastinum/Nodes: No significant thyroid  nodules. Unremarkable  esophagus. No pathologically enlarged axillary, mediastinal or hilar  lymph nodes, noting limited sensitivity for the detection of hilar  adenopathy on this noncontrast study.   Lungs/Pleura: No pneumothorax. No pleural effusion. Mild  centrilobular emphysema with diffuse bronchial wall thickening. No  acute consolidative airspace disease or lung masses. Previously  visualized scattered small solid pulmonary nodules up to 4.5 mm in  the subpleural anterior right middle lobe on image 189 are stable.  Widespread patchy indistinct centrilobular nodularity in the right  lower lobe is new, largest 5.4 mm on image 237.   Upper abdomen: Partially visualized infrarenal IVC. Cholecystectomy.   Musculoskeletal: No aggressive appearing focal osseous lesions. Mild  thoracic spondylosis.   IMPRESSION:  1. Lung-RADS 3, probably benign findings. Short-term follow-up in 6  months is  recommended with repeat low-dose chest CT without contrast  (please use the following order, CT CHEST LCS NODULE FOLLOW-UP W/O  CM). Widespread patchy indistinct centrilobular nodularity in the  right lower lobe, largest 5.4 mm, new, most compatible with a  nonspecific infectious or inflammatory bronchiolitis.  2. Three-vessel coronary atherosclerosis.  3. Aortic Atherosclerosis (ICD10-I70.0) and Emphysema (ICD10-J43.9).   These results will be called to the ordering clinician or  representative by the Radiologist Assistant, and communication  documented in the PACS or [employer].    Electronically Signed    By: Selinda DELENA Blue M.D.    On: 03/13/2024 14:41   Appetite is good, no fever or chill. Weight stable.  Current Medications:  Current Outpatient Medications  Medication Sig Dispense Refill   albuterol  (ACCUNEB ) 0.63 mg/3 mL nebulizer solution Take 0.63 mg by nebulization every 6 (six) hours as needed for Wheezing Unsure if this is the correct medication     albuterol  MDI, PROVENTIL , VENTOLIN , PROAIR , HFA 90 mcg/actuation inhaler Inhale 2 inhalations into the lungs every 6 (six) hours as needed for Wheezing 54 g 1   aspirin 81 MG EC tablet Take 1 tablet (81 mg total) by mouth once daily     clonazePAM  (KLONOPIN ) 0.5 MG tablet Take 0.5 mg by mouth 2 (two) times daily as needed        cyanocobalamin  (VITAMIN B12) 1000 MCG tablet Take 1,000 mcg by mouth once daily as needed     divalproex  (DEPAKOTE ) 250 MG DR tablet Take 250 mg by mouth every 12 (twelve) hours     DOCUSATE CALCIUM (STOOL SOFTENER ORAL) Take 1 capsule by mouth 2 (two) times daily as needed.     fluticasone  furoate-vilanteroL (BREO ELLIPTA ) 100-25 mcg/dose DsDv inhaler INHALE 1 PUFF INTO THE LUNGS ONCE  DAILY 60 each 6   hydroCHLOROthiazide (HYDRODIURIL) 25 MG tablet Take 1 tablet (25 mg total) by mouth once daily 30 tablet 11   lamoTRIgine  (LAMICTAL  XR) 200 mg XR tablet Take 1 tablet (200 mg total) by mouth  once daily 90 tablet 3   lisinopriL  (ZESTRIL ) 10 MG tablet TAKE 1 TABLET BY MOUTH DAILY 90 tablet 1   pantoprazole  (PROTONIX ) 40 MG DR tablet Take 1 tablet (40 mg total) by mouth once daily 90 tablet 1   rosuvastatin (CRESTOR) 10 MG tablet Take 1 tablet (10 mg total) by mouth once daily 90 tablet 3   traZODone  (DESYREL ) 150 MG tablet Take 150 mg by mouth as directed Take Trazodone  150mg  at bedtime + Trazodone  50mg  at bedtime to equal Trazodone  200mg  at bedtime      No current facility-administered medications for this visit.    Problem List:  Patient Active Problem List  Diagnosis   Moderate episode of recurrent major depressive disorder (CMS-HCC) - followed by Dr. Vickey   Anxiety, unspecified - followed by Dr. Vickey   Essential hypertension   Seizures (CMS-HCC) - followed by Dr. Maree   Chronic insomnia - followed by Dr. Vickey   Cognitive deficit as late effect of traumatic brain injury   Borderline diabetes mellitus (A1c 6.1% - 08/22/23) - diet controlled   History of normocytic normochromic anemia (Hgb 12.4 -08/22/23)   Medicare annual wellness visit, initial 12/07/17   Bipolar I disorder, current or most recent episode depressed, with psychotic features (CMS-HCC) - followed by Dr. Vickey   Tobacco dependence (<1/4ppd)   GERD (gastroesophageal reflux disease)   Medicare annual wellness visit, subsequent 02/12/21   COPD, mild , unspecified (CMS-HCC) - followed by Dr. Theotis   History of colon polyps (12/24/18 - repeat 5 yrs per Dr. Aundria)   Gastroesophageal reflux disease without esophagitis   Amphetamine abuse (CMS-HCC)   Coronary artery disease involving native coronary artery of native heart without angina pectoris (3 vessel dz - CT chest 02/20/23)   Aortic atherosclerosis (CT chest 02/20/23)   Other emphysema (Chest CT 02/20/23) - followed by Dr. Theotis   Psychiatric illness   Cigarette smoker    History: Past Medical History:  Diagnosis Date    Anxiety    Bright red rectal bleeding 04/24/2018   Dementia (CMS-HCC)    Depression    GERD (gastroesophageal reflux disease)    HTN (hypertension)    Hyperlipidemia    Migraine    Poor historian    Pulmonary emphysema (CMS/HHS-HCC)    Pulmonary nodules    Seizures (CMS/HHS-HCC)    Subdural hematoma (CMS-HCC)    Ulcer     Past Surgical History:  Procedure Laterality Date   LAPAROSCOPIC CHOLECYSTECTOMY  04/25/2018   COLONOSCOPY  12/24/2018   Tubular adenoma of the colon/Sessile Serrated adenoma [facility] 12/2023   EGD  12/24/2018   Gastritis No repeat per [facility]   CRANIOPLASTY     CRANIOTOMY      Family History  Problem Relation Name Age of Onset   Cancer Mother     High blood pressure (Hypertension) Mother     Colon cancer Mother      Social History   Socioeconomic History   Marital status: Divorced  Tobacco Use   Smoking status: Every Day    Types: Cigarettes    Passive exposure: Current   Smokeless tobacco: Never   Tobacco comments:    Smokes 2-3 cigarettes daily    10/06/23- 3-4 cigarettes daily BVM  Vaping Use   Vaping status: Never Used  Substance and Sexual Activity   Alcohol use: Not Currently   Drug use: Yes    Comment: Occasional M[employer] use   Sexual activity: Not Currently   Social Drivers of Health   Financial Resource Strain: High Risk (07/25/2023)   Overall Financial Resource Strain (CARDIA)    Difficulty of Paying Living Expenses: Hard  Food Insecurity: No Food Insecurity (07/25/2023)   Hunger Vital Sign    Worried About Running Out of Food in the Last Year: Never true    Ran Out of Food in the Last Year: Never true  Transportation Needs: Unmet Transportation Needs (07/25/2023)   PRAPARE - Administrator, Civil Service (Medical): No    Lack of Transportation (Non-Medical): Yes    Allergies:  Amitriptyline  Review of Systems: As per above. Pretty much unchanged with the exception that his breathing is  improved. No associated cardiopulmonary, GI, GU, dermatological symptoms today. No focal neurological symptoms or psychological changes.   Physical Exam: BP (!) 142/88 (BP Location: Left upper arm, Patient Position: Sitting, BP Cuff Size: Large Adult)   Pulse 72   Ht 178.7 cm (5' 10.35)   Wt 95.5 kg (210 lb 9.6 oz)   SpO2 99% Comment: room air  BMI 29.92 kg/m  95.5 kg (210 lb 9.6 oz) 99% General:  NAD. Able to speak in complete sentences without cough or dyspnea HEENT: Normocephalic, nontraumatic. Extraocular movements intact NECK: Supple. No JVD, nodes, thryomegaly CV: RRR no murmurs, gallops, rubs PULM: Normal respiratory effort, Clear to auscultation bilaterally without wheezing or crackles EXTREMITIESs: No significant edema, cyanosis or Homans'signs SKIN: Fair turgor. No rashes LYMPHATIC: No nodes NEURO: No gross deficits PSYCH: Appropriate affect   Diagnostics: SPIROMETRY: FVC was 5.13 L, 104 % of predicted FEV1 was 3.57 L, 93 % of predicted FEV1/FVC ratio was  88 % of predicted FEF 25-75% liters per second was 66 % of predicted   LUNG VOLUMES: TLC was 102 % of predicted RV was 113 % of predicted   DIFFUSION CAPACITY: DLCO was 87 % of predicted DLCO/VA was 77 % of predicted     Good patient effort with good repeatability.   Interpretation: Spiro, lung volumes and diffusion capacity are in noramal range   Interpreting Physician Dr. Theotis   SPIROMETRY: FVC was 4.57 liters, 97% of predicted FEV1 was 3.29, 86% of predicted FEV1 ratio was 71.90 FEF 25-75% liters per second was 67% of predicted Patient unable to complete full exhale and kept removing mouthpiece from mouth stating I can't do no more   LUNG VOLUMES: TLC was 83% of predicted RV was 74% of predicted   DIFFUSION CAPACITY: Patient unable to complete maneuver   FLOW VOLUME LOOP: Expiratory flow volume loop is delayed    Impression Spirometry is in normal range Tlc/rv are in normal range      Interpreting Physician Theotis Lavelle BRAVO, MD   FINDINGS:  Cardiovascular: Aortic atherosclerosis. Normal heart size, without  pericardial effusion. Faint three-vessel coronary artery  calcification.   Mediastinum/Nodes: No mediastinal or hilar adenopathy, given  limitations of unenhanced CT.   Lungs/Pleura: No pleural fluid. Mild motion degradation throughout.  Moderate centrilobular emphysema. Pulmonary nodules of maximally  volume derived equivalent diameter 4.0 mm.   Upper Abdomen: Cholecystectomy. Normal imaged portions of the liver,  spleen, stomach, pancreas, adrenal glands, kidneys.   Musculoskeletal: Remote right clavicular fracture. Remote right rib  fractures.   IMPRESSION:  1. Lung-RADS 2,  benign appearance or behavior. Continue annual  screening with low-dose chest CT without contrast in 12 months.  2. Age advanced coronary artery atherosclerosis. Recommend  assessment of coronary risk factors.  3. Aortic Atherosclerosis (ICD10-I70.0) and Emphysema (ICD10-J43.9).  4. Mild motion degradation.   Electronically Signed    By: Rockey Kilts M.D.    On: 02/18/2022 13:35    FINDINGS:  Cardiovascular: Aortic atherosclerosis. Normal heart size, without  pericardial effusion. Faint three-vessel coronary artery  calcification.   Mediastinum/Nodes: No mediastinal or hilar adenopathy, given  limitations of unenhanced CT.   Lungs/Pleura: No pleural fluid. Mild motion degradation throughout.  Moderate centrilobular emphysema. Pulmonary nodules of maximally  volume derived equivalent diameter 4.0 mm.   Upper Abdomen: Cholecystectomy. Normal imaged portions of the liver,  spleen, stomach, pancreas, adrenal glands, kidneys.   Musculoskeletal: Remote right clavicular fracture. Remote right rib  fractures.   IMPRESSION:  1. Lung-RADS 2, benign appearance or behavior. Continue annual  screening with low-dose chest CT without contrast in 12 months.  2. Age advanced  coronary artery atherosclerosis. Recommend  assessment of coronary risk factors.  3. Aortic Atherosclerosis (ICD10-I70.0) and Emphysema (ICD10-J43.9).  4. Mild motion degradation.    Electronically Signed    By: Rockey Kilts M.D.    On: 02/18/2022 13:35      Impression: Chest ct showed a new RLL 5 mm nodule. Rml nodule stable Repeat chest ct in 6 months  Hx of asthma/copd.  still smoking pfts in normal range -prn albuterol  inhaler  and breo one puff q day -stop smoking advised -maintain ideal weight   Rhinitis, stable Zyrtec, flonase             "

## 2024-03-29 NOTE — Progress Notes (Signed)
 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  Echocardiogram 04/2023 with normal biventricular systolic function, LVEF 55% with no significant valvular abnormality.  PET/CT stress test 06/2023 with normal perfusion, low risk study.  Coronary artery calcification noted.  Today patient details that he is overall doing well.  No complaints of chest pain/pressure.  No increased shortness of breath, has baseline NYHA class II dyspnea.  He lives along with his mother and sister.  No palpitation, dizziness or syncope.  EKG today showed sinus rhythm without any ischemic changes.  Past Medical and Surgical History  Past Medical History Past Medical History:  Diagnosis Date   Anxiety    Bright red rectal bleeding 04/24/2018   Dementia (CMS-HCC)    Depression    GERD (gastroesophageal reflux disease)    HTN (hypertension)    Hyperlipidemia    Migraine    Poor historian    Pulmonary emphysema (CMS/HHS-HCC)    Pulmonary nodules    Seizures (CMS/HHS-HCC)    Subdural hematoma (CMS-HCC)    Ulcer     Past Surgical History He has a past surgical history that includes Craniotomy; Cranioplasty; Laparoscopic cholecystectomy (04/25/2018); Colonoscopy (12/24/2018); and egd (12/24/2018).   Medications and Allergies  Current Medications Current Outpatient Medications  Medication Sig Dispense Refill   albuterol  (ACCUNEB ) 0.63 mg/3 mL nebulizer solution Take 0.63 mg by nebulization  every 6 (six) hours as needed for Wheezing Unsure if this is the correct medication     albuterol  MDI, PROVENTIL , VENTOLIN , PROAIR , HFA 90 mcg/actuation inhaler Inhale 2 inhalations into the lungs every 6 (six) hours as needed for Wheezing 54 g 1   aspirin 81 MG EC tablet Take 1 tablet (81 mg total) by mouth once daily     clonazePAM  (KLONOPIN ) 0.5 MG tablet Take 0.5 mg by mouth 2 (two) times daily as needed        cyanocobalamin  (VITAMIN B12) 1000 MCG tablet Take 1,000 mcg by mouth once daily as needed     divalproex  (DEPAKOTE ) 250 MG DR tablet Take 250 mg by mouth every 12 (twelve) hours     DOCUSATE CALCIUM (STOOL SOFTENER ORAL) Take 1 capsule by mouth 2 (two) times daily as needed.     fluticasone  furoate-vilanteroL (BREO ELLIPTA ) 100-25 mcg/dose DsDv inhaler INHALE 1 PUFF INTO THE LUNGS ONCE DAILY 60 each 6   hydroCHLOROthiazide (HYDRODIURIL) 25 MG tablet Take 1 tablet (25 mg total) by mouth once daily 30 tablet 11   lamoTRIgine  (LAMICTAL  XR) 200 mg XR tablet Take 1 tablet (200 mg total) by mouth once daily 90 tablet 3   lisinopriL  (ZESTRIL ) 10 MG tablet TAKE 1 TABLET BY MOUTH DAILY 90 tablet 1   pantoprazole  (PROTONIX ) 40 MG DR tablet Take 1 tablet (40 mg total) by mouth once daily 90 tablet 1   rosuvastatin (CRESTOR) 10 MG tablet Take 1 tablet (10 mg total) by mouth once daily 90 tablet 3   traZODone  (DESYREL ) 150 MG tablet Take 150 mg by mouth as directed Take Trazodone  150mg  at bedtime + Trazodone  50mg  at bedtime to equal  Trazodone  200mg  at bedtime      No current facility-administered medications for this visit.    Allergies Amitriptyline  Social and Family History  Social History  reports that he has been smoking cigarettes. He has been exposed to tobacco smoke. He has never used smokeless tobacco. He reports that he does not currently use alcohol. He reports current drug use.  Family History family history includes Cancer in his mother; Colon cancer in his mother;  High blood pressure (Hypertension) in his mother.   Review of Systems   Review of Systems:  Stable exertional dyspnea  Physical Examination   Vitals:BP 122/70   Pulse 72   Ht 177.8 cm (5' 10)   Wt 96.2 kg (212 lb)   SpO2 96%   BMI 30.42 kg/m  Ht:177.8 cm (5' 10) Wt:96.2 kg (212 lb) ADJ:Anib surface area is 2.18 meters squared. Body mass index is 30.42 kg/m.  HEENT: Pupils equally reactive to light and accomodation  Neck: Supple, no significant JVD Lungs: clear to auscultation bilaterally; no wheezes, rales, rhonchi Heart: Regular rate and rhythm. No murmur Extremities: No pedal edema  Assessment and Plan   54 y.o. male with CAD as noted by multivessel coronary artery calcification on CT chest 02/2023 PET/CT stress test 06/2023 with no ischemia Tobacco abuse COPD Hypertension, hyperlipidemia History of head trauma  Stable from cardiac standpoint without any anginal symptoms.  Euvolemic on exam Continue low-dose aspirin. Continue Crestor.  Get lipid panel with his next routine labs. Blood pressure well-controlled, continue lisinopril  and hydrochlorothiazide. Patient details that he quit smoking a week ago, congratulated him. Heart healthy diet and regular activity  Orders Placed This Encounter  Procedures   ECG 12-lead    Return in about 6 months (around 09/26/2024).  KRISHNA CHAITANYA ALLURI, MD  This dictation was prepared with dragon dictation. Any transcription errors that result from this process are unintentional.

## 2024-04-01 ENCOUNTER — Other Ambulatory Visit
Admission: RE | Admit: 2024-04-01 | Discharge: 2024-04-01 | Disposition: A | Discharge status: Home / Self Care | Attending: Psychiatry | Admitting: Psychiatry

## 2024-04-01 ENCOUNTER — Encounter: Payer: Self-pay | Admitting: Psychiatry

## 2024-04-01 ENCOUNTER — Ambulatory Visit: Admitting: Psychiatry

## 2024-04-01 ENCOUNTER — Other Ambulatory Visit: Payer: Self-pay

## 2024-04-01 VITALS — BP 126/79 | HR 77 | Temp 97.8°F | Ht 72.0 in | Wt 201.4 lb

## 2024-04-01 DIAGNOSIS — Z79899 Other long term (current) drug therapy: Secondary | ICD-10-CM | POA: Diagnosis not present

## 2024-04-01 DIAGNOSIS — F1994 Other psychoactive substance use, unspecified with psychoactive substance-induced mood disorder: Secondary | ICD-10-CM | POA: Diagnosis not present

## 2024-04-01 DIAGNOSIS — F250 Schizoaffective disorder, bipolar type: Secondary | ICD-10-CM

## 2024-04-01 DIAGNOSIS — F411 Generalized anxiety disorder: Secondary | ICD-10-CM

## 2024-04-01 DIAGNOSIS — G4700 Insomnia, unspecified: Secondary | ICD-10-CM | POA: Diagnosis not present

## 2024-04-01 LAB — CBC
HCT: 34.9 % — ABNORMAL LOW (ref 39.0–52.0)
Hemoglobin: 11.7 g/dL — ABNORMAL LOW (ref 13.0–17.0)
MCH: 28 pg (ref 26.0–34.0)
MCHC: 33.5 g/dL (ref 30.0–36.0)
MCV: 83.5 fL (ref 80.0–100.0)
Platelets: 239 K/uL (ref 150–400)
RBC: 4.18 MIL/uL — ABNORMAL LOW (ref 4.22–5.81)
RDW: 14.1 % (ref 11.5–15.5)
WBC: 8.5 K/uL (ref 4.0–10.5)
nRBC: 0 % (ref 0.0–0.2)

## 2024-04-01 LAB — COMPREHENSIVE METABOLIC PANEL WITH GFR
ALT: 11 U/L (ref 0–44)
AST: 17 U/L (ref 15–41)
Albumin: 4.5 g/dL (ref 3.5–5.0)
Alkaline Phosphatase: 82 U/L (ref 38–126)
Anion gap: 10 (ref 5–15)
BUN: 9 mg/dL (ref 6–20)
CO2: 28 mmol/L (ref 22–32)
Calcium: 9.9 mg/dL (ref 8.9–10.3)
Chloride: 99 mmol/L (ref 98–111)
Creatinine, Ser: 0.85 mg/dL (ref 0.61–1.24)
GFR, Estimated: >60 mL/min
Glucose, Bld: 109 mg/dL — ABNORMAL HIGH (ref 70–99)
Potassium: 4.2 mmol/L (ref 3.5–5.1)
Sodium: 137 mmol/L (ref 135–145)
Total Bilirubin: <0.2 mg/dL (ref 0.0–1.2)
Total Protein: 7.4 g/dL (ref 6.5–8.1)

## 2024-04-01 LAB — VALPROIC ACID LEVEL: Valproic Acid Lvl: 11 ug/mL — ABNORMAL LOW (ref 50–100)

## 2024-04-01 MED ORDER — CLONAZEPAM 0.5 MG PO TABS: 0.5000 mg | ORAL_TABLET | Freq: Two times a day (BID) | ORAL | 0 refills | Status: AC | PRN

## 2024-04-01 NOTE — Patient Instructions (Signed)
 Continue olanzapine  20 mg at night  Continue Depakote  ER 500 mg daily Continue clonazepam  0.5 mg twice a day  Continue Trazodone  150 mg at night as needed for sleep  Obtain labs (VPA, CBC, CMP, UDS) Next appointment-  3/2 at 4 PM

## 2024-04-02 ENCOUNTER — Ambulatory Visit: Payer: Self-pay | Admitting: Psychiatry

## 2024-04-02 NOTE — Progress Notes (Signed)
 Please advise him that his lab results show no significant abnormalities, except that he continues to have anemia. Please advise him to follow up with his primary care provider regarding this. I would recommend continuing the current dose of Depakote  at this time. We are still waiting for the urine test results.

## 2024-04-02 NOTE — Progress Notes (Signed)
 Called patient to discuss lab results advise patient to follow up with his primary care physician about the anemia and to continue the current dose of Depakote  also made patient aware that the provider is still waiting for the urine test results.

## 2024-04-09 LAB — URINE DRUGS OF ABUSE SCREEN W ALC, ROUTINE (REF LAB)
Amphetamines, Urine: NEGATIVE ng/mL
Barbiturate, Ur: NEGATIVE ng/mL
Benzodiazepine Quant, Ur: NEGATIVE ng/mL
Cocaine (Metab.): NEGATIVE ng/mL
Creatinine, Urine: 54.5 mg/dL (ref 20.0–300.0)
Ethanol U, Quan: NEGATIVE %
Methadone Screen, Urine: NEGATIVE ng/mL
Nitrite Urine, Quantitative: NEGATIVE ug/mL
OPIATE SCREEN URINE: NEGATIVE ng/mL
Phencyclidine, Ur: NEGATIVE ng/mL
Propoxyphene, Urine: NEGATIVE ng/mL
pH, Urine: 6.7 (ref 4.5–8.9)

## 2024-04-09 LAB — PANEL 799049
CARBOXY THC GC/MS CONF: 42 ng/mL
Cannabinoid GC/MS, Ur: POSITIVE — AB

## 2024-04-09 NOTE — Progress Notes (Signed)
 Please advise him that urine test shows positive cannabinoid, and otherwise negative. Please advise him to continue the medication as discussed at his last visit.

## 2024-04-09 NOTE — Progress Notes (Signed)
Patient confirmed and verbalized understanding.  

## 2024-05-13 ENCOUNTER — Ambulatory Visit: Admitting: Psychiatry
